# Patient Record
Sex: Male | Born: 1990
Health system: Southern US, Community
[De-identification: ages and names within clinical notes are randomized; demographics above are authoritative.]

## PROBLEM LIST (undated history)

## (undated) DIAGNOSIS — F17200 Nicotine dependence, unspecified, uncomplicated: Secondary | ICD-10-CM

## (undated) DIAGNOSIS — K219 Gastro-esophageal reflux disease without esophagitis: Secondary | ICD-10-CM

## (undated) DIAGNOSIS — F1910 Other psychoactive substance abuse, uncomplicated: Secondary | ICD-10-CM

## (undated) DIAGNOSIS — M797 Fibromyalgia: Secondary | ICD-10-CM

## (undated) DIAGNOSIS — K824 Cholesterolosis of gallbladder: Secondary | ICD-10-CM

## (undated) DIAGNOSIS — F32A Depression, unspecified: Secondary | ICD-10-CM

## (undated) DIAGNOSIS — J45990 Exercise induced bronchospasm: Secondary | ICD-10-CM

## (undated) DIAGNOSIS — K589 Irritable bowel syndrome without diarrhea: Secondary | ICD-10-CM

## (undated) DIAGNOSIS — F329 Major depressive disorder, single episode, unspecified: Secondary | ICD-10-CM

## (undated) DIAGNOSIS — F411 Generalized anxiety disorder: Secondary | ICD-10-CM

## (undated) DIAGNOSIS — Z8489 Family history of other specified conditions: Secondary | ICD-10-CM

## (undated) DIAGNOSIS — M549 Dorsalgia, unspecified: Secondary | ICD-10-CM

## (undated) DIAGNOSIS — G8929 Other chronic pain: Secondary | ICD-10-CM

## (undated) DIAGNOSIS — Z765 Malingerer [conscious simulation]: Secondary | ICD-10-CM

## (undated) DIAGNOSIS — Z973 Presence of spectacles and contact lenses: Secondary | ICD-10-CM

## (undated) DIAGNOSIS — F419 Anxiety disorder, unspecified: Secondary | ICD-10-CM

## (undated) DIAGNOSIS — F319 Bipolar disorder, unspecified: Secondary | ICD-10-CM

## (undated) DIAGNOSIS — K602 Anal fissure, unspecified: Secondary | ICD-10-CM

## (undated) DIAGNOSIS — M2550 Pain in unspecified joint: Secondary | ICD-10-CM

## (undated) HISTORY — DX: Nicotine dependence, unspecified, uncomplicated: F17.200

## (undated) HISTORY — DX: Other chronic pain: M54.9

## (undated) HISTORY — DX: Generalized anxiety disorder: F41.1

## (undated) HISTORY — DX: Irritable bowel syndrome, unspecified: K58.9

## (undated) HISTORY — DX: Exercise induced bronchospasm: J45.990

## (undated) HISTORY — DX: Major depressive disorder, single episode, unspecified: F32.9

## (undated) HISTORY — DX: Gastro-esophageal reflux disease without esophagitis: K21.9

## (undated) HISTORY — DX: Cholesterolosis of gallbladder: K82.4

## (undated) HISTORY — DX: Pain in unspecified joint: M25.50

## (undated) HISTORY — DX: Fibromyalgia: M79.7

## (undated) HISTORY — DX: Presence of spectacles and contact lenses: Z97.3

## (undated) HISTORY — DX: Anal fissure, unspecified: K60.2

## (undated) HISTORY — DX: Depression, unspecified: F32.A

## (undated) HISTORY — DX: Bipolar disorder, unspecified: F31.9

## (undated) HISTORY — PX: WISDOM TOOTH EXTRACTION: SHX21

## (undated) HISTORY — DX: Other psychoactive substance abuse, uncomplicated: F19.10

## (undated) HISTORY — DX: Other chronic pain: G89.29

## (undated) HISTORY — DX: Anxiety disorder, unspecified: F41.9

---
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
## 2011-02-15 LAB — POCT URINALYSIS DIP (DEVICE)
Glucose, UA: NEGATIVE mg/dL
Hgb urine dipstick: NEGATIVE
Nitrite: NEGATIVE
Protein, ur: NEGATIVE mg/dL
pH: 8.5 — ABNORMAL HIGH (ref 5.0–8.0)

## 2011-02-24 ENCOUNTER — Inpatient Hospital Stay (INDEPENDENT_AMBULATORY_CARE_PROVIDER_SITE_OTHER)
Admission: RE | Admit: 2011-02-24 | Discharge: 2011-02-24 | Disposition: A | Discharge status: Home / Self Care | Payer: Medicaid Other | Source: Ambulatory Visit | Attending: Family Medicine | Admitting: Family Medicine

## 2011-02-24 DIAGNOSIS — J40 Bronchitis, not specified as acute or chronic: Secondary | ICD-10-CM

## 2011-03-05 ENCOUNTER — Ambulatory Visit: Payer: Medicaid Other | Admitting: *Deleted
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
## 2011-03-24 ENCOUNTER — Encounter: Payer: Self-pay | Admitting: Medical

## 2011-03-24 ENCOUNTER — Ambulatory Visit (INDEPENDENT_AMBULATORY_CARE_PROVIDER_SITE_OTHER): Payer: Medicaid Other | Admitting: Medical

## 2011-03-24 VITALS — BP 112/82 | HR 100 | Temp 98.0°F | Ht 75.0 in | Wt 230.0 lb

## 2011-03-24 DIAGNOSIS — H6690 Otitis media, unspecified, unspecified ear: Secondary | ICD-10-CM

## 2011-03-24 DIAGNOSIS — Z609 Problem related to social environment, unspecified: Secondary | ICD-10-CM

## 2011-03-24 DIAGNOSIS — R05 Cough: Secondary | ICD-10-CM

## 2011-03-24 DIAGNOSIS — Z7289 Other problems related to lifestyle: Secondary | ICD-10-CM

## 2011-03-24 DIAGNOSIS — Z202 Contact with and (suspected) exposure to infections with a predominantly sexual mode of transmission: Secondary | ICD-10-CM

## 2011-03-24 MED ORDER — AMOXICILLIN 875 MG PO TABS: 875.0000 mg | ORAL_TABLET | Freq: Two times a day (BID) | ORAL | Status: DC

## 2011-03-24 NOTE — Progress Notes (Signed)
Subjective:   HPI Here with mother today for multiple complaints. He has been treated a month ago for bronchitis through urgent care. Was given doxycycline with no improvement. He currently complains of right ear pressure and fullness, cough with productive sputum, some wheezes. Using nothing currently for the symptoms.  He was recently incarcerated for a short period, and while there had a homemade tattoo on his right leg and left upper arm. He is concerned about potential infection from this. He would like testing including STD screening. Just before he left jail he had a negative HIV test. He does note being sexually active, but does not use condoms. He notes 2 sexual partners in the last several months. No other complaints.  Past Medical History  Diagnosis Date  . Anxiety   . Depression   . GERD (gastroesophageal reflux disease)   . Tobacco use disorder   . Gallbladder polyp    History  Substance Use Topics  . Smoking status: Current Everyday Smoker  . Smokeless tobacco: Never Used  . Alcohol Use: No   Reviewed his prior medical, social, surgical history, allergies.  Review of Systems Constitutional: denies fever, chills, sweats, unexpected weight change, anorexia, fatigue Allergy: negative; denies recent sneezing, itching, congestion Dermatology: denies changing moles, rash, lumps, new worrisome lesions ENT: Positive for reduced hearing on the right and congestion. no runny nose, sinus pain, teeth pain Cardiology: denies chest pain, palpitations, edema Respiratory: denies dyspnea on exertion Gastroenterology: denies abdominal pain, nausea, vomiting, diarrhea, constipation, blood in stool, changes in bowel movement, dysphagia Hematology: denies bleeding or bruising problems Musculoskeletal: denies arthralgias, myalgias, joint swelling Urology: Positive for some recent redness rash and dark scabs on the right side of his penis a month ago. Denies prior history of STD. denies  dysuria, difficulty urinating, penile discharge     Objective:   Physical Exam  General appearance: alert, no distress, WD/WN, white male  Skin: Tattoo on right anterior leg just above the knee, tattoo on left upper shoulder, but no erythema or signs of infection HEENT: Right TM with retraction and mild erythema. Otherwise normocephalic, conjunctiva/corneas normal, sclerae anicteric, PERRLA, EOMi, nares patent, no discharge or erythema, pharynx normal Oral cavity: MMM, tongue normal, teeth normal Neck: supple, no lymphadenopathy, no thyromegaly, no masses Heart: RRR, normal S1, S2, no murmurs Lungs: CTA bilaterally, no wheezes, rhonchi, or rales Abdomen: +bs, soft, non tender, non distended, no masses, no hepatomegaly, no splenomegaly Back: No CVA tenderness  Musculoskeletal: Nontender in general  Pulses: 2+ symmetric Psychiatric: normal affect, behavior normal, pleasant     Assessment & Plan:    Encounter Diagnoses  Name Primary?  . High risk social situation   . Venereal disease contact   . Otitis media Yes  . Cough    Patient Instructions  1) Take Amoxicillin 875mg , 1 tablet twice daily for ear infection  2) You can use OTC Mucinex DM or Robitussin DM for cough and congestion.  Drink plenty of fluids, rest.  3) ALWAYS use condoms for safe sex and disease prevention.  Don't use dirty needles or utensil to inject anything into your body or for tattoos.   4) we will call with lab results.

## 2011-03-24 NOTE — Patient Instructions (Signed)
1) Take Amoxicillin 875mg , 1 tablet twice daily for ear infection  2) You can use OTC Mucinex DM or Robitussin DM for cough and congestion.  Drink plenty of fluids, rest.  3) ALWAYS use condoms for safe sex and disease prevention.  Don't use dirty needles or utensil to inject anything into your body or for tattoos.   4) we will call with lab results.

## 2011-03-25 LAB — HEPATITIS B SURFACE ANTIGEN: Hepatitis B Surface Ag: NEGATIVE

## 2011-03-25 LAB — RPR

## 2011-03-25 LAB — GC/CHLAMYDIA PROBE AMP, GENITAL: Chlamydia, DNA Probe: NEGATIVE

## 2011-03-25 LAB — HIV ANTIBODY (ROUTINE TESTING W REFLEX): HIV: NONREACTIVE

## 2011-03-25 LAB — HEPATITIS A ANTIBODY, IGM: Hep A IgM: NEGATIVE

## 2011-03-26 ENCOUNTER — Telehealth: Payer: Self-pay | Admitting: Medical

## 2011-03-26 MED ORDER — AMOXICILLIN 400 MG/5ML PO SUSR: ORAL | Status: DC

## 2011-03-26 NOTE — Telephone Encounter (Signed)
I spoke to mother regarding his labs results.  All labs negative for hep A, hep B, hep C, HIV, syphilis, chlamydia, gonorrhea.  Herpes was equivocal but suggesting negative.   Advised repeat HIV within 6-12 mo.   Also he is not tolerating 875 mg Amoxicillin tablets.  Liquid amoxicillin sent instead.

## 2011-04-22 ENCOUNTER — Other Ambulatory Visit: Payer: Self-pay | Admitting: Orthopedic Surgery

## 2011-04-22 DIAGNOSIS — M545 Low back pain: Secondary | ICD-10-CM

## 2011-04-25 ENCOUNTER — Emergency Department (HOSPITAL_COMMUNITY): Payer: Medicaid Other

## 2011-04-25 ENCOUNTER — Emergency Department (HOSPITAL_COMMUNITY)
Admission: EM | Admit: 2011-04-25 | Discharge: 2011-04-25 | Disposition: A | Discharge status: Home / Self Care | Payer: Medicaid Other | Attending: Emergency Medicine | Admitting: Emergency Medicine

## 2011-04-25 DIAGNOSIS — H5710 Ocular pain, unspecified eye: Secondary | ICD-10-CM | POA: Insufficient documentation

## 2011-04-25 DIAGNOSIS — R51 Headache: Secondary | ICD-10-CM | POA: Insufficient documentation

## 2011-04-25 DIAGNOSIS — S1093XA Contusion of unspecified part of neck, initial encounter: Secondary | ICD-10-CM | POA: Insufficient documentation

## 2011-04-25 DIAGNOSIS — H547 Unspecified visual loss: Secondary | ICD-10-CM | POA: Insufficient documentation

## 2011-04-25 DIAGNOSIS — Z23 Encounter for immunization: Secondary | ICD-10-CM | POA: Insufficient documentation

## 2011-04-25 DIAGNOSIS — S0990XA Unspecified injury of head, initial encounter: Secondary | ICD-10-CM | POA: Insufficient documentation

## 2011-04-25 DIAGNOSIS — S0003XA Contusion of scalp, initial encounter: Secondary | ICD-10-CM | POA: Insufficient documentation

## 2011-04-27 ENCOUNTER — Encounter: Payer: Self-pay | Admitting: Medical

## 2011-04-27 ENCOUNTER — Ambulatory Visit (INDEPENDENT_AMBULATORY_CARE_PROVIDER_SITE_OTHER): Payer: Medicaid Other | Admitting: Medical

## 2011-04-27 VITALS — BP 110/80 | HR 79 | Wt 236.0 lb

## 2011-04-27 DIAGNOSIS — R52 Pain, unspecified: Secondary | ICD-10-CM

## 2011-04-27 DIAGNOSIS — S0093XA Contusion of unspecified part of head, initial encounter: Secondary | ICD-10-CM

## 2011-04-27 DIAGNOSIS — S93409A Sprain of unspecified ligament of unspecified ankle, initial encounter: Secondary | ICD-10-CM

## 2011-04-27 DIAGNOSIS — S0003XA Contusion of scalp, initial encounter: Secondary | ICD-10-CM

## 2011-04-27 DIAGNOSIS — S025XXA Fracture of tooth (traumatic), initial encounter for closed fracture: Secondary | ICD-10-CM

## 2011-04-27 DIAGNOSIS — IMO0002 Reserved for concepts with insufficient information to code with codable children: Secondary | ICD-10-CM

## 2011-04-27 DIAGNOSIS — T07XXXA Unspecified multiple injuries, initial encounter: Secondary | ICD-10-CM

## 2011-04-27 DIAGNOSIS — S1093XA Contusion of unspecified part of neck, initial encounter: Secondary | ICD-10-CM

## 2011-04-27 MED ORDER — OXYCODONE-ACETAMINOPHEN 5-325 MG PO TABS: 1.0000 | ORAL_TABLET | Freq: Four times a day (QID) | ORAL | Status: DC | PRN
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
## 2011-04-27 NOTE — Patient Instructions (Signed)
Rest, use ice pack for your head, neck, and ankle throughout the day for the next 3-4 days.    Percocet as needed every 6 hours for pain for as short a time as needed.  Continue Aleve twice daily.  By Thursday, do gentle neck stretching to loosen the neck.  Elevated and ice your left ankle as well.

## 2011-04-28 ENCOUNTER — Ambulatory Visit
Admission: RE | Admit: 2011-04-28 | Discharge: 2011-04-28 | Disposition: A | Discharge status: Home / Self Care | Payer: Medicaid Other | Source: Ambulatory Visit | Attending: Orthopedic Surgery | Admitting: Orthopedic Surgery

## 2011-04-28 DIAGNOSIS — M545 Low back pain: Secondary | ICD-10-CM

## 2011-05-04 ENCOUNTER — Other Ambulatory Visit: Payer: Self-pay | Admitting: Family Medicine

## 2011-05-26 ENCOUNTER — Other Ambulatory Visit (INDEPENDENT_AMBULATORY_CARE_PROVIDER_SITE_OTHER): Payer: Self-pay

## 2011-05-26 DIAGNOSIS — K824 Cholesterolosis of gallbladder: Secondary | ICD-10-CM

## 2011-05-27 ENCOUNTER — Other Ambulatory Visit: Payer: Medicaid Other

## 2011-06-01 ENCOUNTER — Ambulatory Visit
Admission: RE | Admit: 2011-06-01 | Discharge: 2011-06-01 | Disposition: A | Discharge status: Home / Self Care | Payer: Medicaid Other | Source: Ambulatory Visit | Attending: General Surgery | Admitting: General Surgery

## 2011-06-01 ENCOUNTER — Other Ambulatory Visit: Payer: Medicaid Other

## 2011-06-01 DIAGNOSIS — K824 Cholesterolosis of gallbladder: Secondary | ICD-10-CM

## 2011-06-05 ENCOUNTER — Emergency Department: Payer: Self-pay | Admitting: Unknown Physician Specialty

## 2011-06-21 ENCOUNTER — Encounter (INDEPENDENT_AMBULATORY_CARE_PROVIDER_SITE_OTHER): Payer: Self-pay | Admitting: General Surgery

## 2011-06-30 ENCOUNTER — Encounter (INDEPENDENT_AMBULATORY_CARE_PROVIDER_SITE_OTHER): Payer: Self-pay | Admitting: General Surgery

## 2011-07-16 ENCOUNTER — Ambulatory Visit (HOSPITAL_BASED_OUTPATIENT_CLINIC_OR_DEPARTMENT_OTHER): Admission: RE | Admit: 2011-07-16 | Payer: Medicaid Other | Source: Ambulatory Visit | Admitting: Oral Surgery

## 2011-07-21 ENCOUNTER — Other Ambulatory Visit: Payer: Self-pay | Admitting: Family Medicine

## 2011-07-21 NOTE — Telephone Encounter (Signed)
Not sure about this.

## 2011-08-03 ENCOUNTER — Ambulatory Visit (HOSPITAL_COMMUNITY)
Admission: RE | Admit: 2011-08-03 | Discharge: 2011-08-03 | Disposition: A | Discharge status: Home / Self Care | Payer: Medicaid Other | Source: Ambulatory Visit | Attending: Oral Surgery | Admitting: Oral Surgery

## 2011-08-03 DIAGNOSIS — F17200 Nicotine dependence, unspecified, uncomplicated: Secondary | ICD-10-CM | POA: Insufficient documentation

## 2011-08-03 DIAGNOSIS — F411 Generalized anxiety disorder: Secondary | ICD-10-CM | POA: Insufficient documentation

## 2011-08-03 DIAGNOSIS — J45909 Unspecified asthma, uncomplicated: Secondary | ICD-10-CM | POA: Insufficient documentation

## 2011-08-03 DIAGNOSIS — F319 Bipolar disorder, unspecified: Secondary | ICD-10-CM | POA: Insufficient documentation

## 2011-08-03 DIAGNOSIS — K006 Disturbances in tooth eruption: Secondary | ICD-10-CM | POA: Insufficient documentation

## 2011-08-03 LAB — CBC
MCHC: 35.2 g/dL (ref 30.0–36.0)
Platelets: 224 10*3/uL (ref 150–400)
RDW: 13 % (ref 11.5–15.5)
WBC: 8.5 10*3/uL (ref 4.0–10.5)

## 2011-08-05 NOTE — Op Note (Signed)
NAME:  OLAF, MESA NO.:  [id number]  MEDICAL RECORD NO.:  [id number]  LOCATION:  SDSC                         FACILITY:  [facility]  PHYSICIAN:  Georgia Lopes, M.D.  DATE OF BIRTH:  1991-03-25  DATE OF PROCEDURE:  08/03/2011 DATE OF DISCHARGE:                              OPERATIVE REPORT   PREOPERATIVE DIAGNOSIS:  Impacted teeth numbers 1, 16, 17, and 32.  POSTOPERATIVE DIAGNOSIS:  Impacted teeth numbers 1, 16, 17, and 32.  PROCEDURE:  Removal of teeth numbers 1, 16, 17, and 32.  SURGEON:  Georgia Lopes, MD  ANESTHESIA:  General, Dr. Jacklynn Bue attending, nasal intubation.  ASSISTANTS: 1. Luberta Mutter, DOMA 2. Marlana Latus, DOMA  INDICATIONS FOR PROCEDURE:  Zykee is a 20 year old male who is referred to my office by his general dentist for removal of impacted wisdom teeth x4.  The patient complained of pain for the past several months. Physical exam revealed complete bony impacted teeth numbers 1, 16, 17, and 32.  Kamsiyochukwu has a past medical history significant for asthma, smoking, use of marijuana, drug abuse, mental depression, anxiety, and bipolar.  His current medications include Xanax, Percocet, Naprosyn, hydrocodone, Robaxin, and Prozac.  Because of his high medication intake and difficulty of the planned extractions, general anesthesia was indicated for patient's comfort and safety.  PROCEDURE IN DETAIL:  The patient was taken to the operating room and placed on table in supine position.  General anesthesia was administered intravenously and an oral endotracheal tube was placed and marked.  The patient was draped for the procedure.  The posterior pharynx was suctioned and a throat pack was placed.  Lidocaine 2% with 1:100,000 epinephrine was infiltrated in an inferior alveolar block on the right and left side and buccal and palatal infiltration in the maxilla around the wisdom teeth.  A total of 10 mL was utilized.  A bite block was placed on the  right side of the mouth and sweetheart was used to retract the tongue.  The #15 blade was used to make a incision overlying the tooth #17 carrying buccally on tooth #18 and a similar incision was made with the #15 overlying tooth #16 and carrying anteriorly in an envelope flap technique to the buccal aspect of tooth #15.  The periosteum was reflected with the periosteal elevator.  Then the Sumex drill with fissure bur was used to remove bone around both teeth #16 and #17. Tooth #17 was sectioned and the roots were removed individually with a 301 elevator.  Tooth #16 was removed first attempting with a 301 elevator but finally was removed with the Neurosurgeon.  Sockets were then curetted, irrigated, and closed with 3-0 chromic.  The bite block and sweetheart retractor were repositioned and an envelope flap was created with a #15 blade in the area of tooth #1 and tooth #32.  The periosteum was reflected with the periosteal elevator.  Bone was removed with the Sumex in the maxilla and mandible and the tooth #32 was sectioned and the roots were removed using the 301 elevator in the mandible.  Tooth #1 was removed with a 301 elevator.  Then the sockets were irrigated,  curetted, and closed with 3-0 chromic.  The oral cavity was inspected, found to have good closure and hemostasis.  The oral cavity was irrigated again and suctioned with the Yankauer suction.  The throat pack was removed.  The patient was extubated and taken to the recovery room breathing spontaneously in good condition.  ESTIMATED BLOOD LOSS:  Minimum.  COMPLICATIONS:  None.  SPECIMENS:  None.     Georgia Lopes, M.D.     SMJ/MEDQ  D:  08/03/2011  T:  08/03/2011  Job:  [id number]  Electronically Signed by Ocie Doyne M.D. on 08/05/2011 08:11:35 AM

## 2011-08-17 DIAGNOSIS — Z0271 Encounter for disability determination: Secondary | ICD-10-CM

## 2011-08-18 ENCOUNTER — Ambulatory Visit: Payer: Medicaid Other | Admitting: Medical

## 2011-08-18 ENCOUNTER — Encounter: Payer: Self-pay | Admitting: Medical

## 2011-08-18 ENCOUNTER — Ambulatory Visit (INDEPENDENT_AMBULATORY_CARE_PROVIDER_SITE_OTHER): Payer: 59 | Admitting: Medical

## 2011-08-18 VITALS — BP 130/90 | HR 88 | Temp 97.9°F | Resp 18 | Ht 73.0 in | Wt 229.0 lb

## 2011-08-18 DIAGNOSIS — R05 Cough: Secondary | ICD-10-CM

## 2011-08-18 DIAGNOSIS — Z113 Encounter for screening for infections with a predominantly sexual mode of transmission: Secondary | ICD-10-CM

## 2011-08-18 DIAGNOSIS — Z23 Encounter for immunization: Secondary | ICD-10-CM

## 2011-08-18 DIAGNOSIS — K589 Irritable bowel syndrome without diarrhea: Secondary | ICD-10-CM

## 2011-08-18 DIAGNOSIS — F17200 Nicotine dependence, unspecified, uncomplicated: Secondary | ICD-10-CM

## 2011-08-18 DIAGNOSIS — R059 Cough, unspecified: Secondary | ICD-10-CM

## 2011-08-18 MED ORDER — HYOSCYAMINE SULFATE 0.125 MG PO TABS: ORAL_TABLET | ORAL | Status: DC

## 2011-08-18 NOTE — Progress Notes (Signed)
Subjective:   HPI  Carl Hughes is a 20 y.o. male who presents with his mother for multiple c/o.   He was here back in May for STD screening which was normal.  I advised him to recheck HIV in 66mo.  He is here today to recheck HIV screen.  He is still concerned about genial herpes.  He notes last week having gotten a pustule on his pubic area that is now scabbing.   He denies blister or redness, but just a pustule.  He does shave in the genital region, and this occurred after shaving.  Wonders if this is herpes vs infected hair follicle.  He denies new sexual partners.  He notes ongoing cough x 96mo, worse in the morning.  He continues to smoke, and says he is not ready to quit.  Doesn't feel sick, and no sick contacts.  No other aggravating or relieving factors.    Here for refill on IBS medication.  Has been on this for a while and it works relatively well.  Here for flu shot.   The following portions of the patient's history were reviewed and updated as appropriate: allergies, current medications, past family history, past medical history, past social history, past surgical history and problem list.  Past Medical History  Diagnosis Date  . Anxiety   . Depression   . GERD (gastroesophageal reflux disease)   . Tobacco use disorder   . Gallbladder polyp     Allergies  Allergen Reactions  . Buspar (Buspirone Hcl)   . Ibuprofen (Advil) Hives  . Lamictal (Lamotrigine)     Current Outpatient Prescriptions on File Prior to Visit  Medication Sig Dispense Refill  . bifidobacterium infantis (ALIGN) capsule Take 1 capsule by mouth daily.        Marland Kitchen FLUoxetine (PROZAC) 20 MG tablet Take 10 mg by mouth 2 (two) times daily.        Marland Kitchen gabapentin (NEURONTIN) 400 MG tablet Take 600 mg by mouth 2 (two) times daily.       . hyoscyamine (LEVSIN, ANASPAZ) 0.125 MG tablet Take 0.125 mg by mouth every 12 (twelve) hours.        Marland Kitchen LIDODERM 5 % APPLY 1 PATCH EVERY 12 HOURS AS DIRECTED  30 each  1  .  methocarbamol (ROBAXIN) 750 MG tablet Take 750 mg by mouth 3 (three) times daily.        Marland Kitchen MIRTAZAPINE PO Take by mouth.        . naproxen sodium (ANAPROX) 220 MG tablet Take 220 mg by mouth 2 (two) times daily with a meal.        . QUEtiapine (SEROQUEL) 25 MG tablet Take 25 mg by mouth 2 (two) times daily.        Marland Kitchen ALPRAZolam (XANAX) 1 MG tablet Take 1 mg by mouth 5 (five) times daily.           Review of Systems Constitutional: denies fever, chills, sweats, unexpected weight change, fatigue ENT: no runny nose, ear pain, sore throat Cardiology: denies chest pain, palpitations, edema Respiratory: +cough, occasional wheezing; denies shortness of breath, wheezing Gastroenterology: denies abdominal pain, nausea, vomiting, diarrhea, constipation  Urology: denies dysuria, difficulty urinating, hematuria, urinary frequency, urgency      Objective:   Physical Exam  General appearance: alert, no distress, WD/WN Skin: pubic region with 2mm scaling erythematous lesion, but no pustule, no vesicle, no erythematous base, no drainage HEENT: normocephalic, sclerae anicteric, TMs pearly, nares patent, no discharge or  erythema, pharynx normal Oral cavity: MMM, no lesions Neck: supple, no lymphadenopathy, no thyromegaly, no masses Heart: RRR, normal S1, S2, no murmurs Lungs: congested sounding, few rhonchi, bronchial, but no wheezes, or rales Abdomen: +bs, soft, non tender, non distended, no masses, no hepatomegaly, no splenomegaly Pulses: 2+ symmetric, upper and lower extremities, normal cap refill   Assessment :    Encounter Diagnoses  Name Primary?  . Cough Yes  . Screen for STD (sexually transmitted disease)   . Tobacco use disorder   . Need for influenza vaccination   . IBS (irritable bowel syndrome)     Plan:     Cough - likely due to inflammation from tobacco use.  Advised Mucinex DM if desired.  If cough not improving in 2-3 wk, consider CXR.  Tobacco use - discussed risks and  advised he stop smoking.    STD screening -  Repeat HIV screen today.  Flu vaccine and VIS today.  IBS - refilled medication, advised adding fiber supplement in diet.

## 2011-08-23 LAB — URINALYSIS, ROUTINE W REFLEX MICROSCOPIC
Hgb urine dipstick: NEGATIVE
Ketones, ur: NEGATIVE
Protein, ur: NEGATIVE
Urobilinogen, UA: 0.2

## 2011-08-23 LAB — BASIC METABOLIC PANEL
BUN: 7
CO2: 29
Chloride: 108
Creatinine, Ser: 0.66
Glucose, Bld: 87
Potassium: 3.5

## 2011-08-23 LAB — CBC
HCT: 37.9
MCHC: 35 — ABNORMAL HIGH
MCV: 86
Platelets: 322
RDW: 13.1
WBC: 9.9

## 2011-08-23 LAB — DRUGS OF ABUSE SCREEN W/O ALC, ROUTINE URINE
Amphetamine Screen, Ur: NEGATIVE
Benzodiazepines.: NEGATIVE
Methadone: NEGATIVE
Phencyclidine (PCP): NEGATIVE
Propoxyphene: NEGATIVE

## 2011-08-23 LAB — DIFFERENTIAL
Basophils Absolute: 0
Basophils Relative: 0
Eosinophils Absolute: 0.2
Eosinophils Relative: 2
Neutrophils Relative %: 54

## 2011-08-23 LAB — GC/CHLAMYDIA PROBE AMP, URINE: GC Probe Amp, Urine: NEGATIVE

## 2011-08-23 LAB — HEPATIC FUNCTION PANEL
Albumin: 4.3
Alkaline Phosphatase: 232
Bilirubin, Direct: 0.1
Total Bilirubin: 0.4

## 2011-08-23 LAB — T4, FREE: Free T4: 0.92

## 2011-08-23 LAB — GAMMA GT: GGT: 25

## 2011-08-30 ENCOUNTER — Inpatient Hospital Stay (INDEPENDENT_AMBULATORY_CARE_PROVIDER_SITE_OTHER)
Admission: RE | Admit: 2011-08-30 | Discharge: 2011-08-30 | Disposition: A | Discharge status: Home / Self Care | Payer: 59 | Source: Ambulatory Visit | Attending: Family Medicine | Admitting: Family Medicine

## 2011-08-30 DIAGNOSIS — S335XXA Sprain of ligaments of lumbar spine, initial encounter: Secondary | ICD-10-CM

## 2011-09-27 ENCOUNTER — Other Ambulatory Visit: Payer: Self-pay | Admitting: Medical

## 2011-09-28 ENCOUNTER — Telehealth: Payer: Self-pay | Admitting: Medical

## 2011-09-28 NOTE — Telephone Encounter (Signed)
Message copied by Janeice Robinson on Tue Sep 28, 2011  2:56 PM ------      Message from: Jac Canavan      Created: Tue Sep 28, 2011  7:56 AM       pls ask Vernona Rieger or call pt to find out how he takes his Hyoscyamine.  Ask what he is taking it for?  Is he using 1 BID,TID, QID?             I received request for 90 day supply.

## 2011-09-28 NOTE — Telephone Encounter (Signed)
LAURA SAID HE TAKES THE MEDICATION FOR GERD AND IBS. SHE SAID HE TAKES BETWEEN 2 AND 4, BUT HE AT LEAST TAKES 2 A DAY. CLD

## 2011-10-01 NOTE — Telephone Encounter (Signed)
I discussed refills with mother.  He takes 2-4 tablets daily, originally started on this by Selena Batten, the midlevel that used to work here.  I advised that I am comfortable with him using this as little as possible.  I wrote for up to 3 daily, 90 day supply. Of note, mother dispenses his medications in general and keeps a close watch on his medication usage.  Rx given to mother.

## 2011-10-12 ENCOUNTER — Ambulatory Visit (INDEPENDENT_AMBULATORY_CARE_PROVIDER_SITE_OTHER): Payer: 59 | Admitting: Medical

## 2011-10-12 ENCOUNTER — Ambulatory Visit: Payer: 59 | Admitting: Medical

## 2011-10-12 ENCOUNTER — Encounter: Payer: Self-pay | Admitting: Medical

## 2011-10-12 VITALS — BP 122/80 | HR 72 | Temp 97.8°F | Resp 16 | Wt 242.0 lb

## 2011-10-12 DIAGNOSIS — K589 Irritable bowel syndrome without diarrhea: Secondary | ICD-10-CM

## 2011-10-12 DIAGNOSIS — R252 Cramp and spasm: Secondary | ICD-10-CM | POA: Insufficient documentation

## 2011-10-12 DIAGNOSIS — R259 Unspecified abnormal involuntary movements: Secondary | ICD-10-CM

## 2011-10-12 DIAGNOSIS — R110 Nausea: Secondary | ICD-10-CM | POA: Insufficient documentation

## 2011-10-12 MED ORDER — PROMETHAZINE HCL 25 MG PO TABS: 25.0000 mg | ORAL_TABLET | Freq: Four times a day (QID) | ORAL | Status: AC | PRN

## 2011-10-12 NOTE — Progress Notes (Signed)
Subjective:   HPI  Carl Hughes is a 20 y.o. male who presents with stomach pain x 3 days.  Awoke Sunday, burped up bad egg smell, and stools smelling foul like bad eggs.  Denies vomiting.  He notes belly pain, but it is better today.  He does note fatigue, no appetite, and he notes 50% diarrhea, and 50% constipation, and on average, 4-5 BMs daily for the last few days.  He does report some nausea.   He also notes some muscle spasm under both arms in the tricep area bilat for last few days.  Denies GU symptoms.  He report low grade fever, denies chills or sweats.  He notes some stuffy nose but no other URI symptoms.  No sick contacts with similar symptoms.  Thinks its just something he ate.  Mom gave him some phenergan to help after Pepto Bismol didn't help.  Phenergan helped. Ate some sausage balls, cheese dip, hotdogs that evening. No other aggravating or relieving factors.    No other c/o.  The following portions of the patient's history were reviewed and updated as appropriate: allergies, current medications, past family history, past medical history, past social history, past surgical history and problem list.  Past Medical History  Diagnosis Date  . Anxiety   . Depression   . GERD (gastroesophageal reflux disease)   . Tobacco use disorder   . Gallbladder polyp    Review of Systems Constitutional: +fever, -chills, -sweats, -unexpected -weight change,+fatigue ENT: -runny nose, -ear pain, -sore throat Cardiology:  -chest pain, +palpitations, -edema Respiratory: -cough, -shortness of breath, +wheezing Gastroenterology: +abdominal pain, +nausea, -vomiting, +diarrhea, +constipation Hematology: -bleeding or bruising problems Musculoskeletal: -arthralgias, -myalgias, -joint swelling, +back pain Ophthalmology: -vision changes Urology: -dysuria, -difficulty urinating, -hematuria, -urinary frequency, -urgency Neurology: -headache, -weakness, +tingling, -numbness    Objective:   Physical  Exam  Filed Vitals:   10/12/11 1528  BP: 122/80  Pulse: 72  Temp: 97.8 F (36.6 C)  Resp: 16    General appearance: alert, no distress, WD/WN, lying on exam table  HEENT: normocephalic, sclerae anicteric, TMs pearly, nares patent, no discharge or erythema, pharynx normal Oral cavity: MMM, no lesions Neck: supple, no lymphadenopathy, no thyromegaly, no masses Heart: RRR, normal S1, S2, no murmurs Lungs: CTA bilaterally, no wheezes, rhonchi, or rales Abdomen: +bs, soft, mild lower abdominal tenderness, non distended, no masses, no hepatomegaly, no splenomegaly Back: nontender Pulses: 2+ symmetric, upper and lower extremities, normal cap refill MSK: UE nontender, normal ROM, no deformity, no swelling   Assessment and Plan :    Encounter Diagnoses  Name Primary?  . IBS (irritable bowel syndrome) Yes  . Spasm   . Nausea    IBS - advised that his symptoms seem diet related.  Discussed foods to limit or avoid due to aggravation of GERD or IBS.  Gave 1800 calories diet plan to follow, eat more raw vegetables and fruits, eat less greasy/processed foods, drink more water.    Spasm - trial of OTC Tonic water.   If not improving, he will call psychiatrist as some of these symptoms could represent extrapyramidal symptoms related to his psychotropic medications.     Nausea - promethazine short term as needed for nausea and vomiting.  Follow-up if worse or not improving.

## 2011-10-12 NOTE — Patient Instructions (Signed)
BRAT diet for a few days - bananas, rice, applesauce, toast for a few days.  Consider some Tonic water for the spasm.  If not improving, call your psychiatrist to see if they think it could be coming from your medications.   Irritable Bowel Syndrome Irritable Bowel Syndrome (IBS) is caused by a disturbance of normal bowel function. Other terms used are spastic colon, mucous colitis, and irritable colon. It does not require surgery, nor does it lead to cancer. There is no cure for IBS. But with proper diet, stress reduction, and medication, you will find that your problems (symptoms) will gradually disappear or improve. IBS is a common digestive disorder. It usually appears in late adolescence or early adulthood. Women develop it twice as often as men. CAUSES  After food has been digested and absorbed in the small intestine, waste material is moved into the colon (large intestine). In the colon, water and salts are absorbed from the undigested products coming from the small intestine. The remaining residue, or fecal material, is held for elimination. Under normal circumstances, gentle, rhythmic contractions on the bowel walls push the fecal material along the colon towards the rectum. In IBS, however, these contractions are irregular and poorly coordinated. The fecal material is either retained too long, resulting in constipation, or expelled too soon, producing diarrhea. SYMPTOMS  The most common symptom of IBS is pain. It is typically in the lower left side of the belly (abdomen). But it may occur anywhere in the abdomen. It can be felt as heartburn, backache, or even as a dull pain in the arms or shoulders. The pain comes from excessive bowel-muscle spasms and from the buildup of gas and fecal material in the colon. This pain:  Can range from sharp belly (abdominal) cramps to a dull, continuous ache.   Usually worsens soon after eating.   Is typically relieved by having a bowel movement or passing  gas.  Abdominal pain is usually accompanied by constipation. But it may also produce diarrhea. The diarrhea typically occurs right after a meal or upon arising in the morning. The stools are typically soft and watery. They are often flecked with secretions (mucus). Other symptoms of IBS include:  Bloating.   Loss of appetite.   Heartburn.   Feeling sick to your stomach (nausea).   Belching   Vomiting   Gas.  IBS may also cause a number of symptoms that are unrelated to the digestive system:  Fatigue.   Headaches.   Anxiety   Shortness of breath   Difficulty in concentrating.   Dizziness.  These symptoms tend to come and go. DIAGNOSIS  The symptoms of IBS closely mimic the symptoms of other, more serious digestive disorders. So your caregiver may wish to perform a variety of additional tests to exclude these disorders. He/she wants to be certain of learning what is wrong (diagnosis). The nature and purpose of each test will be explained to you. TREATMENT A number of medications are available to help correct bowel function and/or relieve bowel spasms and abdominal pain. Among the drugs available are:  Mild, non-irritating laxatives for severe constipation and to help restore normal bowel habits.   Specific anti-diarrheal medications to treat severe or prolonged diarrhea.   Anti-spasmodic agents to relieve intestinal cramps.   Your caregiver may also decide to treat you with a mild tranquilizer or sedative during unusually stressful periods in your life.  The important thing to remember is that if any drug is prescribed for you, make  sure that you take it exactly as directed. Make sure that your caregiver knows how well it worked for you. HOME CARE INSTRUCTIONS   Avoid foods that are high in fat or oils. Some examples ZOX:WRUEA cream, butter, frankfurters, sausage, and other fatty meats.   Avoid foods that have a laxative effect, such as fruit, fruit juice, and dairy  products.   Cut out carbonated drinks, chewing gum, and "gassy" foods, such as beans and cabbage. This may help relieve bloating and belching.   Bran taken with plenty of liquids may help relieve constipation.   Keep track of what foods seem to trigger your symptoms.   Avoid emotionally charged situations or circumstances that produce anxiety.   Start or continue exercising.   Get plenty of rest and sleep.  MAKE SURE YOU:   Understand these instructions.   Will watch your condition.   Will get help right away if you are not doing well or get worse.  Document Released: 10/25/2005 Document Revised: 07/07/2011 Document Reviewed: 06/14/2008 [facility] Patient Information 2012 [place], [place].
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
## 2011-12-08 ENCOUNTER — Encounter (HOSPITAL_COMMUNITY): Payer: Self-pay | Admitting: *Deleted

## 2011-12-08 ENCOUNTER — Emergency Department (HOSPITAL_COMMUNITY)
Admission: EM | Admit: 2011-12-08 | Discharge: 2011-12-08 | Disposition: A | Discharge status: Home / Self Care | Payer: 59 | Source: Home / Self Care | Attending: Family Medicine | Admitting: Family Medicine

## 2011-12-08 DIAGNOSIS — IMO0002 Reserved for concepts with insufficient information to code with codable children: Secondary | ICD-10-CM

## 2011-12-08 MED ORDER — TRAMADOL HCL 50 MG PO TABS: 50.0000 mg | ORAL_TABLET | Freq: Four times a day (QID) | ORAL | Status: AC | PRN

## 2011-12-08 MED ORDER — KETOROLAC TROMETHAMINE 30 MG/ML IJ SOLN
30.0000 mg | Freq: Once | INTRAMUSCULAR | Status: AC
  Administered 2011-12-08: 30 mg via INTRAMUSCULAR

## 2011-12-08 MED ORDER — KETOROLAC TROMETHAMINE 30 MG/ML IJ SOLN
INTRAMUSCULAR | Status: AC
  Filled 2011-12-08: qty 1

## 2011-12-08 MED ORDER — CYCLOBENZAPRINE HCL 5 MG PO TABS: 5.0000 mg | ORAL_TABLET | Freq: Three times a day (TID) | ORAL | Status: DC | PRN

## 2011-12-08 NOTE — ED Notes (Signed)
Pt. came back in and requested a work note. Dr. Artis Flock said he can return tomorrow. Note done and given to pt.  He said it was supposed to be a school note. Note redone and given to pt. Carl Hughes 12/08/2011

## 2011-12-08 NOTE — ED Notes (Addendum)
PT  REPORTS  HE  WAS   Lifting  Metal  Objects  Three days  Ago  And    Developed  Back pain  Which  Became  Worse   Yesterday  He  Reports        The  Pain is  Worse on movement and  posistion   He  denys  Any urinary  symptoms

## 2011-12-08 NOTE — ED Provider Notes (Signed)
History     CSN: [id number]  Arrival date & time 12/08/11  1537   First MD Initiated Contact with Patient 12/08/11 1653      Chief Complaint  Patient presents with  . Back Pain    (Consider location/radiation/quality/duration/timing/severity/associated sxs/prior treatment) Patient is a 21 y.o. male presenting with back pain. The history is provided by the patient.  Back Pain  This is a chronic problem. The current episode started more than 2 days ago. The problem occurs constantly. The problem has been gradually worsening. The pain is associated with lifting heavy objects. The quality of the pain is described as shooting. The pain does not radiate. The pain is mild. The symptoms are aggravated by certain positions and twisting. The pain is the same all the time. Pertinent negatives include no numbness, no abdominal pain, no bowel incontinence, no perianal numbness, no bladder incontinence, no dysuria, no paresthesias, no paresis, no tingling and no weakness. Risk factors include obesity.    Past Medical History  Diagnosis Date  . Anxiety   . Depression   . GERD (gastroesophageal reflux disease)   . Current smoker   . Gallbladder polyp   . IBS (irritable bowel syndrome)   . Chronic back pain     Past Surgical History  Procedure Date  . Wisdom tooth extraction     History reviewed. No pertinent family history.  History  Substance Use Topics  . Smoking status: Current Everyday Smoker -- 1.0 packs/day    Types: Cigarettes  . Smokeless tobacco: Never Used  . Alcohol Use: No      Review of Systems  Constitutional: Negative.   Gastrointestinal: Negative.  Negative for abdominal pain and bowel incontinence.  Genitourinary: Negative for bladder incontinence and dysuria.  Musculoskeletal: Positive for back pain. Negative for myalgias, joint swelling and gait problem.  Neurological: Negative.  Negative for tingling, weakness, numbness and paresthesias.    Allergies    Buspar; Ibuprofen; and Lamictal  Home Medications   Current Outpatient Rx  Name Route Sig Dispense Refill  . ALPRAZOLAM 1 MG PO TABS Oral Take 1 mg by mouth 5 (five) times daily.      Marland Kitchen ALIGN PO CAPS Oral Take 1 capsule by mouth daily.      . CYCLOBENZAPRINE HCL 5 MG PO TABS Oral Take 1 tablet (5 mg total) by mouth 3 (three) times daily as needed for muscle spasms. 30 tablet 0  . FLUOXETINE HCL 20 MG PO TABS Oral Take 10 mg by mouth 2 (two) times daily.      Marland Kitchen GABAPENTIN 400 MG PO TABS Oral Take 600 mg by mouth 2 (two) times daily.     Marland Kitchen HYOSCYAMINE SULFATE 0.125 MG PO TABS  1-2 tablets BID 60 tablet 1  . LIDODERM 5 % EX PTCH  APPLY 1 PATCH EVERY 12 HOURS AS DIRECTED 30 each 1  . METHOCARBAMOL 750 MG PO TABS Oral Take 750 mg by mouth 3 (three) times daily.      Marland Kitchen MIRTAZAPINE PO Oral Take by mouth.      Marland Kitchen NAPROXEN SODIUM 220 MG PO TABS Oral Take 500 mg by mouth 2 (two) times daily with a meal.     . QUETIAPINE FUMARATE 25 MG PO TABS Oral Take 25 mg by mouth at bedtime.     Marland Kitchen SAPROPTERIN DIHYDROCHLORIDE 100 MG PO TBSO Oral Take 10 mg/kg by mouth daily.      . TRAMADOL HCL 50 MG PO TABS Oral Take 1 tablet (  50 mg total) by mouth every 6 (six) hours as needed for pain. 20 tablet 0    BP 125/84  Pulse 112  Temp(Src) 97 F (36.1 C) (Oral)  Resp 18  SpO2 96%  Physical Exam  Nursing note and vitals reviewed. Constitutional: He is oriented to person, place, and time. He appears well-developed and well-nourished.  Neck: Normal range of motion. Neck supple.  Abdominal: Soft. Bowel sounds are normal.  Musculoskeletal: He exhibits tenderness.       Back:  Neurological: He is alert and oriented to person, place, and time. He has normal reflexes.  Skin: Skin is warm and dry.  Psychiatric: He has a normal mood and affect.    ED Course  Procedures (including critical care time)  Labs Reviewed - No data to display No results found.   1. Chronic sprain or strain of lumbar region        MDM          Barkley Bruns, MD 12/08/11 1725

## 2011-12-13 ENCOUNTER — Emergency Department (INDEPENDENT_AMBULATORY_CARE_PROVIDER_SITE_OTHER)
Admission: EM | Admit: 2011-12-13 | Discharge: 2011-12-13 | Disposition: A | Discharge status: Home / Self Care | Payer: 59 | Source: Home / Self Care

## 2011-12-13 ENCOUNTER — Encounter (HOSPITAL_COMMUNITY): Payer: Self-pay

## 2011-12-13 ENCOUNTER — Telehealth (HOSPITAL_COMMUNITY): Payer: Self-pay | Admitting: *Deleted

## 2011-12-13 DIAGNOSIS — M542 Cervicalgia: Secondary | ICD-10-CM

## 2011-12-13 DIAGNOSIS — G8929 Other chronic pain: Secondary | ICD-10-CM

## 2011-12-13 MED ORDER — HYDROCODONE-ACETAMINOPHEN 5-325 MG PO TABS: 1.0000 | ORAL_TABLET | Freq: Four times a day (QID) | ORAL | Status: AC | PRN

## 2011-12-13 NOTE — ED Notes (Signed)
Patient c/o his pain has gotten worse; has been taking the 5 mg flexaril 3-4 at a time to help w his pain w/o relief; "I can get the 15 mg tablets OTC without a rx" ; c/o pain in neck that the was not having on his last visit

## 2011-12-13 NOTE — ED Provider Notes (Signed)
History     CSN: [id number]  Arrival date & time 12/13/11  0916   None     Chief Complaint  Patient presents with  . Muscle Pain    (Consider location/radiation/quality/duration/timing/severity/associated sxs/prior treatment) HPI Comments: Corie presents today with complaints of neck pain. He states he was seen one week ago with low back pain which has now moved up into his neck. He states that he has chronic low back pain and was prescribed "crappy medication" last week when he was here. He has been taking 3-4 tablets of the Flexeril 5 mg which was prescribed for him, and the tramadol that was prescribed "is crap and doesn't work". He states that he has bottles of Robaxin and Skelaxin at home and these also do not work. He has had oxycodone and hydrocodone in the past and this is the only thing that works for his pain. He did not schedule an appointment with his primary care doctor because they will not prescribe pain medications for him. His neck pain began last night. He denies injury. No radiation of pain or parasthesias.    Past Medical History  Diagnosis Date  . Anxiety   . Depression   . GERD (gastroesophageal reflux disease)   . Tobacco use disorder   . Gallbladder polyp   . IBS (irritable bowel syndrome)   . Chronic back pain     Past Surgical History  Procedure Date  . Wisdom tooth extraction     History reviewed. No pertinent family history.  History  Substance Use Topics  . Smoking status: Current Everyday Smoker -- 1.0 packs/day    Types: Cigarettes  . Smokeless tobacco: Never Used  . Alcohol Use: No      Review of Systems  HENT: Positive for neck pain.   Musculoskeletal: Positive for back pain.  Neurological: Negative for weakness and numbness.    Allergies  Buspar; Ibuprofen; and Lamictal  Home Medications   Current Outpatient Rx  Name Route Sig Dispense Refill  . ALPRAZOLAM 1 MG PO TABS Oral Take 1 mg by mouth 5 (five) times daily.      Marland Kitchen  ALIGN PO CAPS Oral Take 1 capsule by mouth daily.      Marland Kitchen FLUOXETINE HCL 20 MG PO TABS Oral Take 10 mg by mouth 2 (two) times daily.      Marland Kitchen GABAPENTIN 400 MG PO TABS Oral Take 600 mg by mouth 2 (two) times daily.     Marland Kitchen HYOSCYAMINE SULFATE 0.125 MG PO TABS  1-2 tablets BID 60 tablet 1  . LIDODERM 5 % EX PTCH  APPLY 1 PATCH EVERY 12 HOURS AS DIRECTED 30 each 1  . MIRTAZAPINE PO Oral Take by mouth.      Marland Kitchen NAPROXEN SODIUM 220 MG PO TABS Oral Take 500 mg by mouth 2 (two) times daily with a meal.     . TRAMADOL HCL 50 MG PO TABS Oral Take 1 tablet (50 mg total) by mouth every 6 (six) hours as needed for pain. 20 tablet 0  . HYDROCODONE-ACETAMINOPHEN 5-325 MG PO TABS Oral Take 1 tablet by mouth every 6 (six) hours as needed for pain. 8 tablet 0    BP 138/92  Pulse 97  Temp(Src) 98.6 F (37 C) (Oral)  Resp 16  SpO2 100%  Physical Exam  Nursing note and vitals reviewed. Constitutional: He appears well-developed and well-nourished.       Obese pt, flat affect.  HENT:  Head: Normocephalic and atraumatic.  Neck: Neck supple.  Cardiovascular: Normal rate, regular rhythm and normal heart sounds.   Pulses:      Radial pulses are 2+ on the right side, and 2+ on the left side.  Pulmonary/Chest: Effort normal and breath sounds normal. No respiratory distress.  Musculoskeletal:       Cervical back: He exhibits decreased range of motion (decreased ROM all directions) and tenderness (bilat paraspinal muscle TTP). He exhibits no swelling, no edema, no deformity and no spasm.  Lymphadenopathy:    He has no cervical adenopathy.  Neurological: He is alert.  Reflex Scores:      Bicep reflexes are 2+ on the right side and 2+ on the left side. Skin: Skin is warm and dry.  Psychiatric: He has a normal mood and affect.    ED Course  Procedures (including critical care time)  Labs Reviewed - No data to display No results found.   1. Cervical pain   2. Chronic back pain       MDM   narcotic  registry reviewed. Discussed with pt that we are not the provider to see for chronic pain mgmt. He needs to f/u with his PCP and discuss other treatment options. Discussed that treatment may not include pain medications, though he was centered on where he could go to get rx pain medication.         Melody Comas, [place] 12/13/11 1134

## 2011-12-13 NOTE — ED Provider Notes (Signed)
Medical screening examination/treatment/procedure(s) were performed by non-physician practitioner and as supervising physician I was immediately available for consultation/collaboration.  Raynald Blend, MD 12/13/11 1440

## 2012-04-19 ENCOUNTER — Ambulatory Visit (INDEPENDENT_AMBULATORY_CARE_PROVIDER_SITE_OTHER): Payer: 59 | Admitting: Medical

## 2012-04-19 ENCOUNTER — Encounter: Payer: Self-pay | Admitting: Medical

## 2012-04-19 VITALS — BP 122/90 | HR 76 | Temp 97.8°F | Resp 16 | Wt 260.0 lb

## 2012-04-19 DIAGNOSIS — R102 Pelvic and perineal pain: Secondary | ICD-10-CM

## 2012-04-19 DIAGNOSIS — G8929 Other chronic pain: Secondary | ICD-10-CM

## 2012-04-19 DIAGNOSIS — K589 Irritable bowel syndrome without diarrhea: Secondary | ICD-10-CM

## 2012-04-19 DIAGNOSIS — N50819 Testicular pain, unspecified: Secondary | ICD-10-CM

## 2012-04-19 DIAGNOSIS — N509 Disorder of male genital organs, unspecified: Secondary | ICD-10-CM

## 2012-04-19 DIAGNOSIS — M549 Dorsalgia, unspecified: Secondary | ICD-10-CM

## 2012-04-19 DIAGNOSIS — R109 Unspecified abdominal pain: Secondary | ICD-10-CM

## 2012-04-19 LAB — POCT URINALYSIS DIPSTICK
Bilirubin, UA: NEGATIVE
Blood, UA: NEGATIVE
Ketones, UA: NEGATIVE
pH, UA: 5

## 2012-04-19 MED ORDER — NAPROXEN 500 MG PO TABS: 500.0000 mg | ORAL_TABLET | Freq: Two times a day (BID) | ORAL | Status: DC

## 2012-04-19 MED ORDER — LANSOPRAZOLE 30 MG PO CPDR: 30.0000 mg | DELAYED_RELEASE_CAPSULE | Freq: Every day | ORAL | Status: DC

## 2012-04-19 NOTE — Progress Notes (Signed)
  Subjective:   HPI  Carl Hughes is a 21 y.o. male who presents with multiple c/o. He is concerned about 1 wk hx/o discomfort in right testicle.  He notes some pain with urination.  He is sexually active with multiple partners, uses condoms some of the time.  Denies penile discharge, no redness, swelling.  He has had UTI as a child, but none of recent.    He has hx/o IBS.  Has used antispasmodics, but used sisters prevacid which seemed to help.  He wants to begin Prevacid.  He does eat spicy foods.    He has hx/o chronic back pain.  Has seen back specialist prior.  Has been on Naprosyn daily, Neurontin daily and needs refills.  He does some stretching.  No other aggravating or relieving factors.    No other c/o.  The following portions of the patient's history were reviewed and updated as appropriate: allergies, current medications, past family history, past medical history, past social history, past surgical history and problem list.  Past Medical History  Diagnosis Date  . Anxiety   . Depression   . GERD (gastroesophageal reflux disease)   . Tobacco use disorder   . Gallbladder polyp   . IBS (irritable bowel syndrome)   . Chronic back pain     Allergies  Allergen Reactions  . Buspar (Buspirone Hcl)   . Ibuprofen (Ibuprofen) Hives  . Lamictal (Lamotrigine)      Review of Systems ROS reviewed and was negative other than noted in HPI or above.    Objective:   Physical Exam  General appearance: alert, no distress, WD/WN Abdomen: +bs, soft, non tender, non distended, no masses, no hepatomegaly, no splenomegaly GU: normal external genitalia, no lesions, no mass, mild right testicle tenderness, otherwise nontender, no hernia Pulses: 2+ symmetric  Assessment and Plan :     Encounter Diagnoses  Name Primary?  . Pelvic pain in male Yes  . Testicular pain   . Chronic back pain   . IBS (irritable bowel syndrome)    Pelvic and testicular pain - Urinalysis unremarkable.  STD  screening.  Chronic back [facility] - [facility]  Discussed need to use exercise and stretching to deal with the pain.  NSAIDs BID and neurontin doesn't sem to be the best long term solution.  request most recent spine center notes.  Refilled NSAID today.  IBS - trial of Prevacid since he saw some improvement on this.   Discussed other treatment for IBS.

## 2012-08-02 ENCOUNTER — Ambulatory Visit (INDEPENDENT_AMBULATORY_CARE_PROVIDER_SITE_OTHER): Payer: 59 | Admitting: Medical

## 2012-08-02 ENCOUNTER — Encounter: Payer: Self-pay | Admitting: Medical

## 2012-08-02 VITALS — BP 120/90 | HR 110 | Temp 97.9°F | Resp 18 | Wt 267.0 lb

## 2012-08-02 DIAGNOSIS — R51 Headache: Secondary | ICD-10-CM

## 2012-08-02 DIAGNOSIS — L259 Unspecified contact dermatitis, unspecified cause: Secondary | ICD-10-CM

## 2012-08-02 DIAGNOSIS — G8929 Other chronic pain: Secondary | ICD-10-CM

## 2012-08-02 DIAGNOSIS — M549 Dorsalgia, unspecified: Secondary | ICD-10-CM

## 2012-08-02 DIAGNOSIS — Z23 Encounter for immunization: Secondary | ICD-10-CM

## 2012-08-02 DIAGNOSIS — L309 Dermatitis, unspecified: Secondary | ICD-10-CM

## 2012-08-02 MED ORDER — ELETRIPTAN HYDROBROMIDE 40 MG PO TABS: 40.0000 mg | ORAL_TABLET | ORAL | Status: DC | PRN

## 2012-08-02 MED ORDER — HYDROCORTISONE 2.5 % EX CREA: TOPICAL_CREAM | Freq: Two times a day (BID) | CUTANEOUS | Status: DC

## 2012-08-02 NOTE — Patient Instructions (Addendum)
Begin Hydrocortisone 2.5% cream to rash 2-3 times daily for the next 5-7 days.   Use ice pack, cool baths to help.  Call if not improving.  Headaches - start weaning down on naprosyn by taking 1 tablet every other night for 1-2 weeks, then try stopping this completely for now. In the meantime, use one of the muscle relaxers nightly for the next 3 nights.  If the headache is not improving, try 1 relpax sample for headache.  This can be repeated in 2 hours, but maximum of 2 daily.   relpax is meant to break a headache.  Also, try and continue to gradually cut back on caffeine from soda, tea, coffee, etc.    Regarding back pain, I recommend we try physical therapy.

## 2012-08-02 NOTE — Progress Notes (Signed)
Subjective:   HPI  Carl Hughes is a 21 y.o. male who presents for multiple c/o.  Rash - started about 3 weeks ago not improving.  Thinks it started when his dog got in to some insulation.   He has been having bumps and rash on hands and penis.   Dog does go outside, but not sure about poison ivy exposure.  Denies other exposure.   Using OTC hydrocortisone with some benefit.  Has tried other OTC creams, oatmeal baths, benadryl without benefit.  No other rash.  No other exposures.  Denies any new sexual contacts.  No recent hiking or personal poison ivy exposure.  Chronic headache - 1 wk of worse headache, but gets frequent headaches.  Headaches typically left back of head , throbbing but headaches can move around.  Denies URI symptoms, allergy symptoms, nausea, numbness, tingling, weakness.  Gets some photophobia.  Not sure about triggers, but headaches usually worse at bedtime.  Drinks some tea and soda, but has cut back on soda.  No coffee.  Used Excedrin some without relief. In the past, would go to Urgent Care for acute headache, would be given Tramadol or Vicodin.  Chronic back pain - typically uses Naproxen BID, sees chiropractor some.  Uses Skelaxin or Robaxin periodically.   Has some of each at home.  Pain is daily.  Want flu vaccine today.  The following portions of the patient's history were reviewed and updated as appropriate: allergies, current medications, past family history, past medical history, past social history, past surgical history and problem list.  Past Medical History  Diagnosis Date  . Anxiety   . Depression   . GERD (gastroesophageal reflux disease)   . Tobacco use disorder   . Gallbladder polyp   . IBS (irritable bowel syndrome)   . Chronic back pain     Allergies  Allergen Reactions  . Buspar (Buspirone Hcl)   . Ibuprofen (Ibuprofen) Hives  . Lamictal (Lamotrigine)     Review of Systems ROS reviewed and was negative other than noted in HPI or above.   Objective:   Physical Exam  General appearance: alert, no distress, WD/WN Skin: scattered rough flesh to pink colored small papules not well defined on several fingers, penis with a few whealed lesions with erythema HEENT: normocephalic, sclerae anicteric, TMs pearly, nares patent, no discharge or erythema, pharynx normal Oral cavity: MMM, no lesions Neck: supple, no lymphadenopathy, no thyromegaly, no masses Heart: RRR, normal S1, S2, no murmurs Lungs: CTA bilaterally, no wheezes, rhonchi, or rales Pulses: 2+ symmetric Neuro: Cn2-12 intact  Assessment and Plan :     Encounter Diagnoses  Name Primary?  . Chronic headache Yes  . Chronic back pain   . Dermatitis   . Need for prophylactic vaccination and inoculation against influenza    Chronic headache -  start weaning down on naprosyn by taking 1 tablet every other night for 1-2 weeks, then try stopping this completely for now. In the meantime, use one of the muscle relaxers nightly for the next 3 nights.  If the headache is not improving, try 1 relpax sample for headache.  This can be repeated in 2 hours, but maximum of 2 daily.   relpax is meant to break a headache.  Also, try and continue to gradually cut back on caffeine from soda, tea, coffee, etc.  Recheck 2-3 wk.  Chronic back pain - advised daily stretching routine, avoid aggravating factors.  Advised using medication prn instead of daily.  advised PT.  They will consider and let me know about referral to therapy.    Dermatitis - script for hydrocortisone 2.5% topically x 7-10 days.  Can use ice pack or cool bath to the rash areas.  Call/return if not improving.  Flu vaccine, counseling and VIS given.

## 2012-08-04 ENCOUNTER — Telehealth: Payer: Self-pay | Admitting: Medical

## 2012-08-04 ENCOUNTER — Other Ambulatory Visit: Payer: Self-pay | Admitting: Medical

## 2012-08-04 MED ORDER — METHYLPREDNISOLONE 4 MG PO KIT: PACK | ORAL | Status: DC

## 2012-08-04 NOTE — Telephone Encounter (Signed)
Medrol dose pak sent.  Don't take Naprosyn or other anti-inflammatories while on the steroid.

## 2012-08-11 ENCOUNTER — Encounter: Payer: Self-pay | Admitting: Medical

## 2012-08-11 ENCOUNTER — Ambulatory Visit (INDEPENDENT_AMBULATORY_CARE_PROVIDER_SITE_OTHER): Payer: 59 | Admitting: Medical

## 2012-08-11 VITALS — BP 124/90 | HR 104 | Resp 18 | Wt 258.0 lb

## 2012-08-11 DIAGNOSIS — R21 Rash and other nonspecific skin eruption: Secondary | ICD-10-CM

## 2012-08-11 MED ORDER — PERMETHRIN 5 % EX CREA: TOPICAL_CREAM | Freq: Once | CUTANEOUS | Status: DC

## 2012-08-11 NOTE — Progress Notes (Signed)
  Subjective:   HPI  Carl Hughes is a 21 y.o. male who presents for rash not improved.   Rash - started about 4+ weeks ago not improving.  Thinks it started when his dog got in to some insulation.   He has been having bumps and rash on hands and penis.   Dog does go outside, but not sure about poison ivy exposure.  Denies other exposure.   Using 2.5% hydrocortisone with some benefit on the penis.  Has tried other OTC creams, oatmeal baths, benadryl without benefit.  No other rash.  No other exposures.  Denies any new sexual contacts.  No recent hiking or personal poison ivy exposure.  The following portions of the patient's history were reviewed and updated as appropriate: allergies, current medications, past family history, past medical history, past social history, past surgical history and problem list.  Review of Systems ROS reviewed and was negative other than noted in HPI or above.    Objective:   Physical Exam  General appearance: alert, no distress, WD/WN Skin: several small raised whealed lesions 2-26mm diameter on penis, bilat inner thighs, right buttock, suggestive of hives vs possible scabies, vs less likely folliculitis   Assessment and Plan :     Encounter Diagnosis  Name Primary?  . Rash Yes   Script for Permethrin 5% cream.  If this doesn't resolve the rash, consider treating as folliculitis or refer to dermatology.

## 2012-08-23 ENCOUNTER — Ambulatory Visit (INDEPENDENT_AMBULATORY_CARE_PROVIDER_SITE_OTHER): Payer: 59 | Admitting: Medical

## 2012-08-23 ENCOUNTER — Encounter: Payer: Self-pay | Admitting: Medical

## 2012-08-23 VITALS — BP 120/82 | HR 105 | Temp 98.5°F | Resp 18 | Wt 263.0 lb

## 2012-08-23 DIAGNOSIS — J45990 Exercise induced bronchospasm: Secondary | ICD-10-CM

## 2012-08-23 DIAGNOSIS — IMO0002 Reserved for concepts with insufficient information to code with codable children: Secondary | ICD-10-CM

## 2012-08-23 DIAGNOSIS — R21 Rash and other nonspecific skin eruption: Secondary | ICD-10-CM

## 2012-08-23 MED ORDER — ALBUTEROL SULFATE HFA 108 (90 BASE) MCG/ACT IN AERS: 2.0000 | INHALATION_SPRAY | Freq: Four times a day (QID) | RESPIRATORY_TRACT | Status: DC | PRN

## 2012-08-23 NOTE — Progress Notes (Signed)
Subjective: Here for multiple c/o.    Right middle finger with nail bed redness and pain x several days.  He has been putting topical OTC antibiotic ointment and used a pin to pop a pus pocket.  Did have pus drainage.    He notes hx/o exercise induced asthma.  Lately feels chest tightness and SOB mostly in the evenings.  inhaler is expired.  Asks about albuterol treatment nebulized today.  Has several itchy bumps throughout.   He has been treated recently for rash by me that resolved, but this is another new itchy rash.   Objective: Gen: wd, wn, nad  Skin: right middle finger medial dorsal surface of nail bed at base with erythema, fluctuant area;  Small scattered 1mm lesions throughout arms that are erythematous Lungs: CTA, no rhonchi, rales, wheezes Heart: RRR, normal S1, S2, no murmurs  Assessment: Encounter Diagnoses  Name Primary?  . Paronychia Yes  . Exercise-induced asthma   . Rash and nonspecific skin eruption    Plan: Paronychia- discussed diagnosis.  He desires I&D.  Cleaned and prepped in usual sterile procedure.   Used ethyl acetate spray for local anesthesia.  Used #11 blade to incise fluctuant area of nailbed.  Small amount of pus and blood expressed.  Cleaned area and covered with antibiotic ointment and sterile bandage.    Exercise induced asthma - refilled inhaler.  Will set up for PFTs.  Advised he begin Zyrtec 10mg  daily, stop smoking.  Rash - begin zyrtec.  If not improving, refer to dermatology.

## 2012-08-23 NOTE — Patient Instructions (Signed)
Begin zyrtec 10mg  nightly OTC for allergies.  I refilled your albuterol inhaler.   Paronychia infection - use salt water soaks, continue the topical antibiotic ointment.  Call if worse redness or swelling again.  Paronychia Paronychia is an inflammatory reaction involving the folds of the skin surrounding the fingernail. This is commonly caused by an infection in the skin around a nail. The most common cause of paronychia is frequent wetting of the hands (as seen with bartenders, food servers, nurses or others who wet their hands). This makes the skin around the fingernail susceptible to infection by bacteria (germs) or fungus. Other predisposing factors are:  Aggressive manicuring.  Nail biting.  Thumb sucking. The most common cause is a staphylococcal (a type of germ) infection, or a fungal (Candida) infection. When caused by a germ, it usually comes on suddenly with redness, swelling, pus and is often painful. It may get under the nail and form an abscess (collection of pus), or form an abscess around the nail. If the nail itself is infected with a fungus, the treatment is usually prolonged and may require oral medicine for up to one year. Your caregiver will determine the length of time treatment is required. The paronychia caused by bacteria (germs) may largely be avoided by not pulling on hangnails or picking at cuticles. When the infection occurs at the tips of the finger it is called felon. When the cause of paronychia is from the herpes simplex virus (HSV) it is called herpetic whitlow. TREATMENT  When an abscess is present treatment is often incision and drainage. This means that the abscess must be cut open so the pus can get out. When this is done, the following home care instructions should be followed. HOME CARE INSTRUCTIONS   It is important to keep the affected fingers very dry. Rubber or plastic gloves over cotton gloves should be used whenever the hand must be placed in  water.  Keep wound clean, dry and dressed as suggested by your caregiver between warm soaks or warm compresses.  Soak in warm water for fifteen to twenty minutes three to four times per day for bacterial infections. Fungal infections are very difficult to treat, so often require treatment for long periods of time.  For bacterial (germ) infections take antibiotics (medicine which kill germs) as directed and finish the prescription, even if the problem appears to be solved before the medicine is gone.  Only take over-the-counter or prescription medicines for pain, discomfort, or fever as directed by your caregiver. SEEK IMMEDIATE MEDICAL CARE IF:  You have redness, swelling, or increasing pain in the wound.  You notice pus coming from the wound.  You have a fever.  You notice a bad smell coming from the wound or dressing. Document Released: 04/20/2001 Document Revised: 01/17/2012 Document Reviewed: 12/20/2008 [facility] Patient Information 2013 [place], [place].

## 2012-09-14 ENCOUNTER — Other Ambulatory Visit: Payer: Self-pay | Admitting: Medical

## 2012-09-14 NOTE — Telephone Encounter (Signed)
RX REFILL 

## 2012-10-17 ENCOUNTER — Other Ambulatory Visit: Payer: Self-pay | Admitting: Medical

## 2012-10-17 MED ORDER — NAPROXEN 500 MG PO TABS: 500.0000 mg | ORAL_TABLET | Freq: Two times a day (BID) | ORAL | Status: DC

## 2012-10-17 NOTE — Telephone Encounter (Signed)
Rx refill

## 2012-10-18 ENCOUNTER — Institutional Professional Consult (permissible substitution): Payer: 59 | Admitting: Medical

## 2012-10-18 ENCOUNTER — Encounter: Payer: Self-pay | Admitting: Medical

## 2012-10-18 ENCOUNTER — Ambulatory Visit (INDEPENDENT_AMBULATORY_CARE_PROVIDER_SITE_OTHER): Payer: 59 | Admitting: Medical

## 2012-10-18 VITALS — BP 130/82 | Temp 97.7°F | Wt 272.0 lb

## 2012-10-18 DIAGNOSIS — Z113 Encounter for screening for infections with a predominantly sexual mode of transmission: Secondary | ICD-10-CM

## 2012-10-18 DIAGNOSIS — K219 Gastro-esophageal reflux disease without esophagitis: Secondary | ICD-10-CM

## 2012-10-18 DIAGNOSIS — R21 Rash and other nonspecific skin eruption: Secondary | ICD-10-CM

## 2012-10-18 DIAGNOSIS — N489 Disorder of penis, unspecified: Secondary | ICD-10-CM

## 2012-10-18 MED ORDER — HYDROCORTISONE 2.5 % EX CREA: TOPICAL_CREAM | Freq: Two times a day (BID) | CUTANEOUS | Status: DC

## 2012-10-18 MED ORDER — LANSOPRAZOLE 30 MG PO CPDR: 30.0000 mg | DELAYED_RELEASE_CAPSULE | Freq: Every day | ORAL | Status: DC

## 2012-10-18 MED ORDER — PERMETHRIN 5 % EX CREA: TOPICAL_CREAM | Freq: Once | CUTANEOUS | Status: DC

## 2012-10-18 NOTE — Progress Notes (Signed)
Subjective: Wants STD testing.  The prior rash that was thought to be scabies is now gone.  He has some new bumps on penis now, are small.  Been there 2-3 days.  No new sexual partners.  No pain ,no discharge, but there is itching around the base of the penis.  Itching x 2-3 days.  No burning with urination.  Takes Prevacid for reflux.  No particular problems with this.  Needs refills.  Has bumps on right inner thigh and left buttock, thinks they are infected hairs.    Past Medical History  Diagnosis Date  . Anxiety   . Depression   . GERD (gastroesophageal reflux disease)   . Tobacco use disorder   . Gallbladder polyp   . IBS (irritable bowel syndrome)   . Chronic back pain    ROS as in HPI  Objective: Gen: wd, wn, nad Skin GU: dorsal glans penis and left shaft of penis with 3 small patches of erythema with an urticaria appearence, but no obvious vesicles, no induration, several similar slightly raised round 2-3 mm erythematous lesions/rash along bilat posteromedial inner upper thighs  Assessment: Encounter Diagnoses  Name Primary?  . Rash of penis Yes  . Rash and nonspecific skin eruption   . Screen for STD (sexually transmitted disease)   . GERD (gastroesophageal reflux disease)    Plan: Rash appears to be possible bites, could be scabies, fleas.  No obvious cellulitis, no obvious folliculitis.  On the penis specifically, no obvious herpes unless very early stage.  More likely bites like the other rash.  Unfortunately, no vesicle to unroof and swab.  discussed limitations of HSV testing.    Patient Instructions  Use the Permethrin cream topically.  Put on at bedtime, leave on all night and wash off in the morning.  Repeat for 4-7 days.   If still having rash in 1 week, repeat the Permethrin.  During the day, use the Hydrocortisone 2.5% cream topically twice daily to the rashes on the legs, buttocks and use thin layer on the penis rash.  Return if the penis rash appears to  have blisters or worsening.  Prevacid refills sent.

## 2012-10-18 NOTE — Patient Instructions (Signed)
Use the Permethrin cream topically.  Put on at bedtime, leave on all night and wash off in the morning.  Repeat for 4-7 days.   If still having rash in 1 week, repeat the Permethrin.  During the day, use the Hydrocortisone 2.5% cream topically twice daily to the rashes on the legs, buttocks and use thin layer on the penis rash.  Return if the penis rash appears to have blisters or worsening.

## 2012-10-19 LAB — HSV(HERPES SIMPLEX VRS) I + II AB-IGM: Herpes Simplex Vrs I&II-IgM Ab (EIA): 0.31 INDEX

## 2013-01-17 ENCOUNTER — Telehealth: Payer: Self-pay | Admitting: Medical

## 2013-01-17 ENCOUNTER — Other Ambulatory Visit: Payer: Self-pay | Admitting: Medical

## 2013-01-17 MED ORDER — LIDOCAINE 5 % EX PTCH: 1.0000 | MEDICATED_PATCH | CUTANEOUS | Status: DC

## 2013-01-19 NOTE — Telephone Encounter (Signed)
DONE

## 2013-03-07 ENCOUNTER — Encounter: Payer: Self-pay | Admitting: Medical

## 2013-03-07 ENCOUNTER — Ambulatory Visit (INDEPENDENT_AMBULATORY_CARE_PROVIDER_SITE_OTHER): Payer: 59 | Admitting: Medical

## 2013-03-07 VITALS — BP 112/80 | HR 92 | Temp 98.3°F | Resp 18 | Wt 229.0 lb

## 2013-03-07 DIAGNOSIS — R110 Nausea: Secondary | ICD-10-CM

## 2013-03-07 DIAGNOSIS — M79609 Pain in unspecified limb: Secondary | ICD-10-CM

## 2013-03-07 DIAGNOSIS — M549 Dorsalgia, unspecified: Secondary | ICD-10-CM

## 2013-03-07 DIAGNOSIS — S6990XA Unspecified injury of unspecified wrist, hand and finger(s), initial encounter: Secondary | ICD-10-CM

## 2013-03-07 DIAGNOSIS — M79641 Pain in right hand: Secondary | ICD-10-CM

## 2013-03-07 DIAGNOSIS — R1013 Epigastric pain: Secondary | ICD-10-CM

## 2013-03-07 MED ORDER — DEXLANSOPRAZOLE 60 MG PO CPDR: 60.0000 mg | DELAYED_RELEASE_CAPSULE | Freq: Every day | ORAL | Status: DC

## 2013-03-07 MED ORDER — HYDROCODONE-ACETAMINOPHEN 5-325 MG PO TABS: 1.0000 | ORAL_TABLET | Freq: Four times a day (QID) | ORAL | Status: DC | PRN

## 2013-03-07 MED ORDER — LIDOCAINE 5 % EX PTCH: 1.0000 | MEDICATED_PATCH | CUTANEOUS | Status: DC

## 2013-03-07 MED ORDER — SUCRALFATE 1 G PO TABS: 1.0000 g | ORAL_TABLET | Freq: Four times a day (QID) | ORAL | Status: DC

## 2013-03-07 NOTE — Patient Instructions (Signed)
STOP prevacid.  STOP ANY anti-inflammatories (ibuprofen, naprosyn, aleve, aspirin).    Begin Dexilant once daily before breakfast to help treat possible ulcer.   Begin Sucralfate tablets, 45-60 minute prior to meal to coat the stomach.  Avoid alcohol.  Avoid spicy and greasy, acidic foods.  Avoid tomato and citrus foods for now.  Go for xray.

## 2013-03-07 NOTE — Progress Notes (Signed)
Subjective: Here for 2 issues.  He has been having increased back pain.  I have seen him for this prior.  He routinely has been taking Ibuprofen on a regular basis.  He has seen back specialist in the past.  He says he was upset the other day because of the pain and started hitting "studs" in the walls of his house.   Punched the walls repeatedly with his fists.  Now has pain and swelling along both hands, 5th digit area.   He is thinking he may have a fracture.  Mom has made an appt for him to see back specialist soon.  He reports epigastric abdominal pain.  Has lost a lot of weight in the last few months due to decreased appetite and abdominal pain.  Bread sometimes help the pain.  If he waits too long between meals, the pain is worse.  He has nausea.  He has continued to take ibuprofen.   He does report drinking alcohol on occasional, typically 3 drinks at a time - beer or mixed drink with 12% wine and other spirits.  Nothing seems to help the pain of late.  Past Medical History  Diagnosis Date  . Anxiety   . Depression   . GERD (gastroesophageal reflux disease)   . Tobacco use disorder   . Gallbladder polyp   . IBS (irritable bowel syndrome)   . Chronic back pain    ROS as in subjective   Objective: Filed Vitals:   03/07/13 1553  BP: 112/80  Pulse: 92  Temp: 98.3 F (36.8 C)  Resp: 18    General appearance: alert, no distress, WD/WN Oral cavity: MMM, no lesions Neck: supple, no lymphadenopathy, no thyromegaly, no masses Heart: RRR, normal S1, S2, no murmurs Lungs: CTA bilaterally, no wheezes, rhonchi, or rales Abdomen: +bs, soft, epigastric tenderness, some generalized tenderness, non distended, no masses, no hepatomegaly, no splenomegaly MSK: left hand with mild swelling and pain of 5th metacarpal distally, pain with 5th finger ROM, decreased flexion ROM, mild tenderness of right hand 5th metacarpal, otherwise hands, wrists, arms nontender, normal ROM, no obvious  deformity Hands/fingers neurovascularly intact other than decreased flexion strength of 5th left digit  xrays of bilat hands, 3 view, somewhat underexposed with left distal 5th metacarpal with angulation at metaphysis.  Possible fracture, but not clear.  Otherwise no obvious fracture or bony deformity.  Rest of xrays bilat unremarkable  Assessment: Encounter Diagnoses  Name Primary?  . Bilateral hand pain Yes  . Hand trauma, unspecified laterality, initial encounter   . Abdominal pain, epigastric   . Nausea alone   . Back pain      Plan: Hand pain, trauma - advised ice, rest, avoid reinjury, can use lortab prn for few days.  Advised that xray suggests possible boxers fracture, 5th left metacarpal fracture, but not clear on xray.   Will have supervising physician Dr. Susann Givens review and help with plan, possible ortho referral.  Abdominal pain, nausea - possible ulcer given ongoing NSAID use, symptoms and exam.  Advised he stop all NSAIDS.  Change from Prevacid to Dexilant, begin Sucralfate, avoid acidic foods, and recheck in 1wk.  Back pain - f/u as planned with orthopedics/back specialist.  Follow-up pending Dr. Susann Givens review of the xrays, ortho referral

## 2013-03-15 ENCOUNTER — Telehealth: Payer: Self-pay | Admitting: Medical

## 2013-03-16 ENCOUNTER — Telehealth: Payer: Self-pay | Admitting: Internal Medicine

## 2013-03-16 MED ORDER — LIDOCAINE 5 % EX PTCH: 1.0000 | MEDICATED_PATCH | CUTANEOUS | Status: DC

## 2013-03-16 NOTE — Telephone Encounter (Signed)
Mom wanted lidoderm canceled at [facility] and sent to [facility]

## 2013-05-04 NOTE — Telephone Encounter (Signed)
LM

## 2013-06-18 ENCOUNTER — Ambulatory Visit: Payer: 59 | Admitting: Medical

## 2013-08-27 ENCOUNTER — Other Ambulatory Visit: Payer: Self-pay | Admitting: Medical

## 2013-08-27 ENCOUNTER — Telehealth: Payer: Self-pay | Admitting: Medical

## 2013-08-27 MED ORDER — HYDROCORTISONE 2.5 % EX CREA: TOPICAL_CREAM | Freq: Two times a day (BID) | CUTANEOUS | Status: DC

## 2013-08-28 NOTE — Telephone Encounter (Signed)
lm

## 2013-09-02 DIAGNOSIS — F1994 Other psychoactive substance use, unspecified with psychoactive substance-induced mood disorder: Secondary | ICD-10-CM

## 2013-09-02 DIAGNOSIS — F1110 Opioid abuse, uncomplicated: Secondary | ICD-10-CM | POA: Insufficient documentation

## 2013-09-02 DIAGNOSIS — F1320 Sedative, hypnotic or anxiolytic dependence, uncomplicated: Secondary | ICD-10-CM

## 2013-09-02 DIAGNOSIS — F1210 Cannabis abuse, uncomplicated: Secondary | ICD-10-CM

## 2013-09-02 DIAGNOSIS — F1410 Cocaine abuse, uncomplicated: Secondary | ICD-10-CM | POA: Insufficient documentation

## 2013-09-02 DIAGNOSIS — F1010 Alcohol abuse, uncomplicated: Secondary | ICD-10-CM

## 2013-09-02 HISTORY — DX: Cocaine abuse, uncomplicated: F14.10

## 2013-09-02 HISTORY — DX: Other psychoactive substance use, unspecified with psychoactive substance-induced mood disorder: F19.94

## 2013-09-02 HISTORY — DX: Opioid abuse, uncomplicated: F11.10

## 2013-09-02 HISTORY — DX: Cannabis abuse, uncomplicated: F12.10

## 2013-09-02 HISTORY — DX: Sedative, hypnotic or anxiolytic dependence, uncomplicated: F13.20

## 2013-09-02 HISTORY — DX: Alcohol abuse, uncomplicated: F10.10

## 2013-09-26 ENCOUNTER — Telehealth: Payer: Self-pay | Admitting: Medical

## 2013-09-26 MED ORDER — LIDOCAINE 5 % EX PTCH: 1.0000 | MEDICATED_PATCH | CUTANEOUS | Status: DC

## 2013-09-28 ENCOUNTER — Other Ambulatory Visit: Payer: Self-pay | Admitting: Medical

## 2013-09-28 ENCOUNTER — Telehealth: Payer: Self-pay | Admitting: Medical

## 2013-09-28 MED ORDER — METHOCARBAMOL 500 MG PO TABS: 500.0000 mg | ORAL_TABLET | Freq: Every evening | ORAL | Status: DC | PRN

## 2013-09-28 NOTE — Telephone Encounter (Signed)
Due to flare up of back pains, short term Robaxin called out

## 2013-10-02 ENCOUNTER — Encounter: Payer: Self-pay | Admitting: Medical

## 2013-10-02 ENCOUNTER — Ambulatory Visit (INDEPENDENT_AMBULATORY_CARE_PROVIDER_SITE_OTHER): Payer: 59 | Admitting: Medical

## 2013-10-02 ENCOUNTER — Other Ambulatory Visit: Payer: Self-pay | Admitting: Medical

## 2013-10-02 VITALS — BP 102/78 | HR 79 | Temp 98.1°F | Resp 16 | Wt 216.0 lb

## 2013-10-02 DIAGNOSIS — M791 Myalgia, unspecified site: Secondary | ICD-10-CM

## 2013-10-02 DIAGNOSIS — IMO0001 Reserved for inherently not codable concepts without codable children: Secondary | ICD-10-CM

## 2013-10-02 DIAGNOSIS — K589 Irritable bowel syndrome without diarrhea: Secondary | ICD-10-CM

## 2013-10-02 DIAGNOSIS — G894 Chronic pain syndrome: Secondary | ICD-10-CM

## 2013-10-02 DIAGNOSIS — G4700 Insomnia, unspecified: Secondary | ICD-10-CM

## 2013-10-02 DIAGNOSIS — M2550 Pain in unspecified joint: Secondary | ICD-10-CM

## 2013-10-02 LAB — CBC WITH DIFFERENTIAL/PLATELET
Basophils Absolute: 0 10*3/uL (ref 0.0–0.1)
Basophils Relative: 0 % (ref 0–1)
Eosinophils Absolute: 0.2 10*3/uL (ref 0.0–0.7)
Eosinophils Relative: 2 % (ref 0–5)
HCT: 42.3 % (ref 39.0–52.0)
Hemoglobin: 15 g/dL (ref 13.0–17.0)
Lymphs Abs: 2.7 10*3/uL (ref 0.7–4.0)
MCHC: 35.5 g/dL (ref 30.0–36.0)
Neutro Abs: 5.6 10*3/uL (ref 1.7–7.7)
Neutrophils Relative %: 61 % (ref 43–77)
Platelets: 319 10*3/uL (ref 150–400)
RDW: 14.2 % (ref 11.5–15.5)
WBC: 9.2 10*3/uL (ref 4.0–10.5)

## 2013-10-02 MED ORDER — CYCLOBENZAPRINE HCL 10 MG PO TABS: 10.0000 mg | ORAL_TABLET | Freq: Every day | ORAL | Status: DC

## 2013-10-02 NOTE — Telephone Encounter (Signed)
LM

## 2013-10-02 NOTE — Progress Notes (Signed)
Subjective: Here for long hx/o pain.  He notes starting to have pain in early teenage years.  Over the last several years he has went from primarily having back pain to now having muscle aches throughout, joints aches throughout, ongoing back pain.  Just hurts in general.  Gets pains in back from upper to lower, bilat knee pain, bilat wrist pains, pain in MCP and PIPs, pains in ankles.  Cold weather makes it worse.   Has prior seen Dr. Nick[facility] few years back for similar, had negative labs/screen for RA and ankylosing spondylitis, has [facility] for a period of time, has seen orthopedics and chiropractic therapy.  Has been on a variety of medications.  Mom now wonders about fibromyalgia as he has all the right symptoms .  In addition to chronic pains, morning stiffness, he has hx/o IBS constipation dominate, concentration problems, is a smoker, drinks occasionally.  No other aggravating or relieving factors.  No other c/o.    Allergies  Allergen Reactions  . Buspar [Buspirone Hcl]   . Ibuprofen [Ibuprofen] Hives  . Lamictal [Lamotrigine]     Current Outpatient Prescriptions on File Prior to Visit  Medication Sig Dispense Refill  . albuterol (VENTOLIN HFA) 108 (90 BASE) MCG/ACT inhaler Inhale 2 puffs into the lungs every 6 (six) hours as needed for wheezing.  1 Inhaler  0  . alprazolam (XANAX) 2 MG tablet Take 2 mg by mouth at bedtime as needed for sleep.      . clonazePAM (KLONOPIN) 1 MG tablet Take 1 mg by mouth 3 (three) times daily.       . hydrocortisone 2.5 % cream Apply topically 2 (two) times daily.  30 g  0  . lidocaine (LIDODERM) 5 % Place 1 patch onto the skin daily. Remove & Discard patch within 12 hours or as directed by MD  30 patch  0   No current facility-administered medications on file prior to visit.    Past Medical History  Diagnosis Date  . Anxiety   . Depression   . GERD (gastroesophageal reflux disease)   . Tobacco use disorder   .  Gallbladder polyp   . IBS (irritable bowel syndrome)   . Chronic back pain     Past Surgical History  Procedure Laterality Date  . Wisdom tooth extraction      No family history on file.  History   Social History  . Marital Status: Single    Spouse Name: N/A    Number of Children: N/A  . Years of Education: N/A   Occupational History  . Not on file.   Social History Main Topics  . Smoking status: Current Every Day Smoker -- 1.00 packs/day    Types: Cigarettes  . Smokeless tobacco: Never Used  . Alcohol Use: No  . Drug Use: No  . Sexual Activity: Yes    Birth Control/ Protection: None   Other Topics Concern  . Not on file   Social History Narrative  . No narrative on file    Reviewed their medical, surgical, family, social, medication, and allergy history and updated chart as appropriate.   Objective: Filed Vitals:   10/02/13 1627  BP: 102/78  Pulse: 79  Temp: 98.1 F (36.7 C)  Resp: 16    General appearance: alert, no distress, WD/WN Oral cavity: MMM, no lesions Neck: supple, no lymphadenopathy, no thyromegaly, no masses Heart: RRR, normal S1, S2, no murmurs Lungs: CTA bilaterally, no wheezes, rhonchi, or  rales MSK: tender throughout paraspinal muscles, but nontender midline, ROM WNL Back: mild tenderness triceps bilat, tender wrists, tender thighs, but no obvious deformity, normal ROM of UE and LE Pulses: 2+ symmetric, upper and lower extremities, normal cap refill Neuro: nonfocal exam  Assessment: Encounter Diagnoses  Name Primary?  . Chronic pain syndrome Yes  . Myalgia   . Arthralgia   . Insomnia   . IBS (irritable bowel syndrome)      Plan: Reviewed prior rheumatology notes and labs, reviewed other prior records.   Will check some baseline labs today.   Consider fibromyalgia as possible diagnosis vs psychosomatic, vs other.  Consider referral back to rheumatology.  Consider pharmquest study for fibromyalgia.  Follow-up pending labs.

## 2013-10-03 LAB — COMPREHENSIVE METABOLIC PANEL
AST: 13 U/L (ref 0–37)
BUN: 10 mg/dL (ref 6–23)
CO2: 26 mEq/L (ref 19–32)
Calcium: 9.7 mg/dL (ref 8.4–10.5)
Chloride: 105 mEq/L (ref 96–112)
Creat: 0.87 mg/dL (ref 0.50–1.35)
Total Bilirubin: 0.5 mg/dL (ref 0.3–1.2)

## 2013-10-03 LAB — TSH: TSH: 1.809 u[IU]/mL (ref 0.350–4.500)

## 2013-10-09 ENCOUNTER — Ambulatory Visit: Payer: 59 | Admitting: Family Medicine

## 2013-10-12 ENCOUNTER — Other Ambulatory Visit: Payer: Self-pay | Admitting: Medical

## 2013-10-12 MED ORDER — DULOXETINE HCL 30 MG PO CPEP: 30.0000 mg | ORAL_CAPSULE | Freq: Every day | ORAL | Status: DC

## 2013-10-15 ENCOUNTER — Telehealth: Payer: Self-pay | Admitting: Medical

## 2013-10-16 NOTE — Telephone Encounter (Signed)
I don't recall ordering new tests, but I did review his last MRI.  We are treating him for fibromyalgia.  If we are considering other imaging, I need records from where his back issues and pains were being treated prior - ortho, chiro, back specialists, other?  You can have Tammy or Olegario Messier do the records request.

## 2013-10-26 ENCOUNTER — Other Ambulatory Visit: Payer: Self-pay | Admitting: Medical

## 2013-10-26 ENCOUNTER — Telehealth: Payer: Self-pay | Admitting: Medical

## 2013-10-26 MED ORDER — CYCLOBENZAPRINE HCL 10 MG PO TABS: 10.0000 mg | ORAL_TABLET | Freq: Every day | ORAL | Status: DC

## 2013-11-06 ENCOUNTER — Telehealth (INDEPENDENT_AMBULATORY_CARE_PROVIDER_SITE_OTHER): Payer: Self-pay | Admitting: *Deleted

## 2013-11-06 NOTE — Telephone Encounter (Signed)
Pts mother called in asking if Dr. Abbey Chatters could order an abdominal ultrasound prior to the end of the year since their deductible has been met.  I spoke with Cyndra Numbers and because the pt hasn't been seen in the office since his last ultrasound in 2012 she scheduled the pt for an office visit to be seen by Dr. Abbey Chatters prior to him ordering any tests.  I informed her of pts appt on 11/26/13 with an arrival time of 10:30am.  She states she will let pt know.

## 2013-11-09 ENCOUNTER — Ambulatory Visit: Payer: 59 | Admitting: Medical

## 2013-11-10 ENCOUNTER — Emergency Department (HOSPITAL_COMMUNITY)
Admission: EM | Admit: 2013-11-10 | Discharge: 2013-11-10 | Disposition: A | Discharge status: Home / Self Care | Payer: 59 | Attending: Emergency Medicine | Admitting: Emergency Medicine

## 2013-11-10 ENCOUNTER — Encounter (HOSPITAL_COMMUNITY): Payer: Self-pay | Admitting: Emergency Medicine

## 2013-11-10 DIAGNOSIS — Z8719 Personal history of other diseases of the digestive system: Secondary | ICD-10-CM | POA: Insufficient documentation

## 2013-11-10 DIAGNOSIS — F3289 Other specified depressive episodes: Secondary | ICD-10-CM | POA: Insufficient documentation

## 2013-11-10 DIAGNOSIS — R1084 Generalized abdominal pain: Secondary | ICD-10-CM | POA: Insufficient documentation

## 2013-11-10 DIAGNOSIS — F411 Generalized anxiety disorder: Secondary | ICD-10-CM | POA: Insufficient documentation

## 2013-11-10 DIAGNOSIS — F329 Major depressive disorder, single episode, unspecified: Secondary | ICD-10-CM | POA: Insufficient documentation

## 2013-11-10 DIAGNOSIS — G8929 Other chronic pain: Secondary | ICD-10-CM | POA: Insufficient documentation

## 2013-11-10 DIAGNOSIS — Z79899 Other long term (current) drug therapy: Secondary | ICD-10-CM | POA: Insufficient documentation

## 2013-11-10 DIAGNOSIS — R112 Nausea with vomiting, unspecified: Secondary | ICD-10-CM | POA: Insufficient documentation

## 2013-11-10 DIAGNOSIS — R630 Anorexia: Secondary | ICD-10-CM | POA: Insufficient documentation

## 2013-11-10 DIAGNOSIS — IMO0002 Reserved for concepts with insufficient information to code with codable children: Secondary | ICD-10-CM | POA: Insufficient documentation

## 2013-11-10 DIAGNOSIS — A09 Infectious gastroenteritis and colitis, unspecified: Secondary | ICD-10-CM | POA: Insufficient documentation

## 2013-11-10 DIAGNOSIS — F17200 Nicotine dependence, unspecified, uncomplicated: Secondary | ICD-10-CM | POA: Insufficient documentation

## 2013-11-10 LAB — URINALYSIS, ROUTINE W REFLEX MICROSCOPIC
BILIRUBIN URINE: NEGATIVE
GLUCOSE, UA: NEGATIVE mg/dL
Hgb urine dipstick: NEGATIVE
Ketones, ur: NEGATIVE mg/dL
Leukocytes, UA: NEGATIVE
Nitrite: NEGATIVE
PH: 6 (ref 5.0–8.0)
Protein, ur: 30 mg/dL — AB
SPECIFIC GRAVITY, URINE: 1.02 (ref 1.005–1.030)
Urobilinogen, UA: 0.2 mg/dL (ref 0.0–1.0)

## 2013-11-10 LAB — CBC WITH DIFFERENTIAL/PLATELET
BASOS ABS: 0 10*3/uL (ref 0.0–0.1)
BASOS PCT: 0 % (ref 0–1)
Eosinophils Absolute: 0.2 10*3/uL (ref 0.0–0.7)
Eosinophils Relative: 1 % (ref 0–5)
HCT: 46.7 % (ref 39.0–52.0)
HEMOGLOBIN: 17 g/dL (ref 13.0–17.0)
Lymphocytes Relative: 13 % (ref 12–46)
Lymphs Abs: 2.3 10*3/uL (ref 0.7–4.0)
MCH: 32.3 pg (ref 26.0–34.0)
MCHC: 36.4 g/dL — ABNORMAL HIGH (ref 30.0–36.0)
MCV: 88.6 fL (ref 78.0–100.0)
Monocytes Absolute: 1.3 10*3/uL — ABNORMAL HIGH (ref 0.1–1.0)
Monocytes Relative: 8 % (ref 3–12)
NEUTROS ABS: 13.5 10*3/uL — AB (ref 1.7–7.7)
Neutrophils Relative %: 78 % — ABNORMAL HIGH (ref 43–77)
PLATELETS: 332 10*3/uL (ref 150–400)
RBC: 5.27 MIL/uL (ref 4.22–5.81)
RDW: 12.8 % (ref 11.5–15.5)
WBC: 17.4 10*3/uL — ABNORMAL HIGH (ref 4.0–10.5)

## 2013-11-10 LAB — URINE MICROSCOPIC-ADD ON

## 2013-11-10 LAB — COMPREHENSIVE METABOLIC PANEL
ALBUMIN: 4.6 g/dL (ref 3.5–5.2)
ALK PHOS: 127 U/L — AB (ref 39–117)
ALT: 20 U/L (ref 0–53)
AST: 17 U/L (ref 0–37)
BILIRUBIN TOTAL: 0.5 mg/dL (ref 0.3–1.2)
BUN: 10 mg/dL (ref 6–23)
CHLORIDE: 101 meq/L (ref 96–112)
CO2: 24 mEq/L (ref 19–32)
Calcium: 9.8 mg/dL (ref 8.4–10.5)
Creatinine, Ser: 0.72 mg/dL (ref 0.50–1.35)
GFR calc Af Amer: >90 mL/min (ref >90)
GFR calc non Af Amer: >90 mL/min (ref >90)
Glucose, Bld: 130 mg/dL — ABNORMAL HIGH (ref 70–99)
POTASSIUM: 3.7 meq/L (ref 3.7–5.3)
SODIUM: 140 meq/L (ref 137–147)
TOTAL PROTEIN: 8 g/dL (ref 6.0–8.3)

## 2013-11-10 LAB — LIPASE, BLOOD: Lipase: 20 U/L (ref 11–59)

## 2013-11-10 MED ORDER — DIPHENHYDRAMINE HCL 50 MG/ML IJ SOLN
25.0000 mg | Freq: Once | INTRAMUSCULAR | Status: AC
  Administered 2013-11-10: 25 mg via INTRAVENOUS
  Filled 2013-11-10: qty 1

## 2013-11-10 MED ORDER — METOCLOPRAMIDE HCL 10 MG PO TABS: 10.0000 mg | ORAL_TABLET | Freq: Four times a day (QID) | ORAL | Status: DC

## 2013-11-10 MED ORDER — ONDANSETRON 4 MG PO TBDP
8.0000 mg | ORAL_TABLET | Freq: Once | ORAL | Status: AC
  Administered 2013-11-10: 8 mg via ORAL
  Filled 2013-11-10: qty 2

## 2013-11-10 MED ORDER — SODIUM CHLORIDE 0.9 % IV BOLUS (SEPSIS)
1000.0000 mL | Freq: Once | INTRAVENOUS | Status: AC
  Administered 2013-11-10: 1000 mL via INTRAVENOUS

## 2013-11-10 NOTE — ED Provider Notes (Signed)
CSN: [id number]     Arrival date & time 11/10/13  1458 History   First MD Initiated Contact with Patient 11/10/13 1531     Chief Complaint  Patient presents with  . Abdominal Pain  . Emesis  . Diarrhea   (Consider location/radiation/quality/duration/timing/severity/associated sxs/prior Treatment) Patient is a 23 y.o. male presenting with abdominal pain.  Abdominal Pain Pain location:  Generalized Pain quality: aching   Pain radiates to:  Does not radiate Pain severity:  Severe Onset quality:  Gradual Duration:  3 days Timing:  Constant Progression:  Waxing and waning Chronicity:  New Context: suspicious food intake (recently had home-made pork sausage given to family for Christmas)   Relieved by:  Nothing Associated symptoms: anorexia, diarrhea, nausea and vomiting   Associated symptoms: no chest pain, no chills, no cough, no dysuria, no fever, no shortness of breath and no sore throat     Past Medical History  Diagnosis Date  . Anxiety   . Depression   . GERD (gastroesophageal reflux disease)   . Tobacco use disorder   . Gallbladder polyp   . IBS (irritable bowel syndrome)   . Chronic back pain    Past Surgical History  Procedure Laterality Date  . Wisdom tooth extraction     History reviewed. No pertinent family history. History  Substance Use Topics  . Smoking status: Current Every Day Smoker -- 1.00 packs/day    Types: Cigarettes  . Smokeless tobacco: Never Used  . Alcohol Use: No    Review of Systems  Constitutional: Negative for fever and chills.  HENT: Negative for sore throat.   Eyes: Negative for pain.  Respiratory: Negative for cough and shortness of breath.   Cardiovascular: Negative for chest pain.  Gastrointestinal: Positive for nausea, vomiting, abdominal pain, diarrhea and anorexia.  Genitourinary: Negative for dysuria and flank pain.  Musculoskeletal: Negative for back pain and neck pain.  Skin: Negative for rash.  Neurological: Negative for  seizures and headaches.    Allergies  Zofran; Buspar; Ibuprofen; and Lamictal  Home Medications   Current Outpatient Rx  Name  Route  Sig  Dispense  Refill  . albuterol (VENTOLIN HFA) 108 (90 BASE) MCG/ACT inhaler   Inhalation   Inhale 2 puffs into the lungs every 6 (six) hours as needed for wheezing.   1 Inhaler   0   . alprazolam (XANAX) 2 MG tablet   Oral   Take 2 mg by mouth at bedtime as needed for sleep.         . clonazePAM (KLONOPIN) 1 MG tablet   Oral   Take 1 mg by mouth 3 (three) times daily.          . cyclobenzaprine (FLEXERIL) 10 MG tablet   Oral   Take 1 tablet (10 mg total) by mouth at bedtime.   20 tablet   0   . hydrocortisone 2.5 % cream   Topical   Apply topically 2 (two) times daily.   30 g   0   . lidocaine (LIDODERM) 5 %   Transdermal   Place 1 patch onto the skin daily. Remove & Discard patch within 12 hours or as directed by MD   30 patch   0   . DULoxetine (CYMBALTA) 30 MG capsule   Oral   Take 1 capsule (30 mg total) by mouth daily.   30 capsule   2   . metoCLOPramide (REGLAN) 10 MG tablet   Oral   Take 1 tablet (  10 mg total) by mouth every 6 (six) hours.   30 tablet   0    BP 103/68  Pulse 90  Temp(Src) 97.7 F (36.5 C) (Oral)  Resp 18  SpO2 96% Physical Exam  Constitutional: He is oriented to person, place, and time. He appears well-developed and well-nourished. No distress.  HENT:  Head: Normocephalic and atraumatic.  Eyes: Pupils are equal, round, and reactive to light.  Neck: Normal range of motion.  Cardiovascular: Normal rate and regular rhythm.   Pulmonary/Chest: Effort normal and breath sounds normal.  Abdominal: Soft. He exhibits no distension. There is no tenderness. There is no rigidity, no rebound, no guarding and no tenderness at McBurney's point.  Musculoskeletal: Normal range of motion.  Neurological: He is alert and oriented to person, place, and time.  Skin: Skin is warm. He is not diaphoretic.     ED Course  Procedures (including critical care time) Labs Review Labs Reviewed  CBC WITH DIFFERENTIAL - Abnormal; Notable for the following:    WBC 17.4 (*)    MCHC 36.4 (*)    Neutrophils Relative % 78 (*)    Neutro Abs 13.5 (*)    Monocytes Absolute 1.3 (*)    All other components within normal limits  COMPREHENSIVE METABOLIC PANEL - Abnormal; Notable for the following:    Glucose, Bld 130 (*)    Alkaline Phosphatase 127 (*)    All other components within normal limits  URINALYSIS, ROUTINE W REFLEX MICROSCOPIC - Abnormal; Notable for the following:    APPearance TURBID (*)    Protein, ur 30 (*)    All other components within normal limits  LIPASE, BLOOD  URINE MICROSCOPIC-ADD ON   Imaging Review No results found.  EKG Interpretation   None       MDM   1. Infectious diarrhea in adult patient    23 yo M with abdominal pain, n/v/d. Soft abdomen, nontender.   Likely viral gastro vs food poisoning. Labs significant for leukocytosis. No other acute findings on labs at this time. Patient with mild improvement after fluids, antiemetics. Patient with mild allergic reaction to zofran. Discussed symptomatic management, fluids at home. Strict return precautions given. Patient discharged to home with reglan, recommended NSAIDs/tylenol for pain control. Recommended PCP follow-up. Discharged to home in stable condition. Patient seen and evaluated by myself and my attending, Dr. Vanita Panda.      Freddi Che, MD 11/11/13 0005

## 2013-11-10 NOTE — ED Notes (Signed)
MD at bedside. 

## 2013-11-10 NOTE — Discharge Instructions (Signed)
Diarrhea Diarrhea is frequent loose and watery bowel movements. It can cause you to feel weak and dehydrated. Dehydration can cause you to become tired and thirsty, have a dry mouth, and have decreased urination that often is dark yellow. Diarrhea is a sign of another problem, most often an infection that will not last long. In most cases, diarrhea typically lasts 2 3 days. However, it can last longer if it is a sign of something more serious. It is important to treat your diarrhea as directed by your caregive to lessen or prevent future episodes of diarrhea. CAUSES  Some common causes include:  Gastrointestinal infections caused by viruses, bacteria, or parasites.  Food poisoning or food allergies.  Certain medicines, such as antibiotics, chemotherapy, and laxatives.  Artificial sweeteners and fructose.  Digestive disorders. HOME CARE INSTRUCTIONS  Ensure adequate fluid intake (hydration): have 1 cup (8 oz) of fluid for each diarrhea episode. Avoid fluids that contain simple sugars or sports drinks, fruit juices, whole milk products, and sodas. Your urine should be clear or pale yellow if you are drinking enough fluids. Hydrate with an oral rehydration solution that you can purchase at pharmacies, retail stores, and online. You can prepare an oral rehydration solution at home by mixing the following ingredients together:    tsp table salt.   tsp baking soda.   tsp salt substitute containing potassium chloride.  1  tablespoons sugar.  1 L (34 oz) of water.  Certain foods and beverages may increase the speed at which food moves through the gastrointestinal (GI) tract. These foods and beverages should be avoided and include:  Caffeinated and alcoholic beverages.  High-fiber foods, such as raw fruits and vegetables, nuts, seeds, and whole grain breads and cereals.  Foods and beverages sweetened with sugar alcohols, such as xylitol, sorbitol, and mannitol.  Some foods may be well  tolerated and may help thicken stool including:  Starchy foods, such as rice, toast, pasta, low-sugar cereal, oatmeal, grits, baked potatoes, crackers, and bagels.  Bananas.  Applesauce.  Add probiotic-rich foods to help increase healthy bacteria in the GI tract, such as yogurt and fermented milk products.  Wash your hands well after each diarrhea episode.  Only take over-the-counter or prescription medicines as directed by your caregiver.  Take a warm bath to relieve any burning or pain from frequent diarrhea episodes. SEEK IMMEDIATE MEDICAL CARE IF:   You are unable to keep fluids down.  You have persistent vomiting.  You have blood in your stool, or your stools are black and tarry.  You do not urinate in 6 8 hours, or there is only a small amount of very dark urine.  You have abdominal pain that increases or localizes.  You have weakness, dizziness, confusion, or lightheadedness.  You have a severe headache.  Your diarrhea gets worse or does not get better.  You have a fever or persistent symptoms for more than 2 3 days.  You have a fever and your symptoms suddenly get worse. MAKE SURE YOU:   Understand these instructions.  Will watch your condition.  Will get help right away if you are not doing well or get worse. Document Released: 10/15/2002 Document Revised: 10/11/2012 Document Reviewed: 07/02/2012 [facility] Patient Information 2014 [place], [place].  Antibiotic Nonuse  Your caregiver felt that the infection or problem was not one that would be helped with an antibiotic. Infections may be caused by viruses or bacteria. Only a caregiver can tell which one of these is the likely cause  of an illness. A cold is the most common cause of infection in both adults and children. A cold is a virus. Antibiotic treatment will have no effect on a viral infection. Viruses can lead to many lost days of work caring for sick children and many missed days of school. Children may  catch as many as 10 "colds" or "flus" per year during which they can be tearful, cranky, and uncomfortable. The goal of treating a virus is aimed at keeping the ill person comfortable. Antibiotics are medications used to help the body fight bacterial infections. There are relatively few types of bacteria that cause infections but there are hundreds of viruses. While both viruses and bacteria cause infection they are very different types of germs. A viral infection will typically go away by itself within 7 to 10 days. Bacterial infections may spread or get worse without antibiotic treatment. Examples of bacterial infections are:  Sore throats (like strep throat or tonsillitis).  Infection in the lung (pneumonia).  Ear and skin infections. Examples of viral infections are:  Colds or flus.  Most coughs and bronchitis.  Sore throats not caused by Strep.  Runny noses. It is often best not to take an antibiotic when a viral infection is the cause of the problem. Antibiotics can kill off the helpful bacteria that we have inside our body and allow harmful bacteria to start growing. Antibiotics can cause side effects such as allergies, nausea, and diarrhea without helping to improve the symptoms of the viral infection. Additionally, repeated uses of antibiotics can cause bacteria inside of our body to become resistant. That resistance can be passed onto harmful bacterial. The next time you have an infection it may be harder to treat if antibiotics are used when they are not needed. Not treating with antibiotics allows our own immune system to develop and take care of infections more efficiently. Also, antibiotics will work better for [place] when they are prescribed for bacterial infections. Treatments for a child that is ill may include:  Give extra fluids throughout the day to stay hydrated.  Get plenty of rest.  Only give your child over-the-counter or prescription medicines for pain, discomfort, or  fever as directed by your caregiver.  The use of a cool mist humidifier may help stuffy noses.  Cold medications if suggested by your caregiver. Your caregiver may decide to start you on an antibiotic if:  The problem you were seen for today continues for a longer length of time than expected.  You develop a secondary bacterial infection. SEEK MEDICAL CARE IF:  Fever lasts longer than 5 days.  Symptoms continue to get worse after 5 to 7 days or become severe.  Difficulty in breathing develops.  Signs of dehydration develop (poor drinking, rare urinating, dark colored urine).  Changes in behavior or worsening tiredness (listlessness or lethargy). Document Released: 01/03/2002 Document Revised: 01/17/2012 Document Reviewed: 07/02/2009 [facility] Patient Information 2014 [place], [place].

## 2013-11-10 NOTE — ED Notes (Signed)
Pt and family member reports generalized abd pain, n/v/d x 3 days and feeling lightheaded. Denies fever/chills.

## 2013-11-11 NOTE — ED Provider Notes (Signed)
This patient was seen in conjunction with the resident physician, Dr. Silvio Clayman. The documentation is accurate and reflects our evaluation of the patient, and the clinical course.  On my exam: This young male presents with several days of waxing and waning abdominal discomfort.  On exam is awake, alert, in no distress.  However, with this concern of abdominal pain he received fluid hydration.  Patient has been on peritoneal abdomen, was ambulatory throughout his emergency department course, and improved here.  Patient's presentation is most likely consistent with antritis was acute food reaction.  Absent focal tenderness, or an acute abdomen, there is no indication for additional imaging.  With absence of fever, distress, and is appropriate for discharge with outpatient management.  Carmin Muskrat, MD 11/11/13 [phone]

## 2013-11-20 DIAGNOSIS — Z0271 Encounter for disability determination: Secondary | ICD-10-CM

## 2013-11-26 ENCOUNTER — Ambulatory Visit (INDEPENDENT_AMBULATORY_CARE_PROVIDER_SITE_OTHER): Payer: Self-pay | Admitting: General Surgery

## 2013-12-03 ENCOUNTER — Telehealth: Payer: Self-pay | Admitting: Medical

## 2013-12-06 ENCOUNTER — Other Ambulatory Visit: Payer: Self-pay | Admitting: Medical

## 2013-12-06 ENCOUNTER — Telehealth: Payer: Self-pay | Admitting: Medical

## 2013-12-06 MED ORDER — DULOXETINE HCL 30 MG PO CPEP: 30.0000 mg | ORAL_CAPSULE | Freq: Two times a day (BID) | ORAL | Status: DC

## 2013-12-06 NOTE — Telephone Encounter (Signed)
lm

## 2013-12-06 NOTE — Telephone Encounter (Signed)
i increased to 30mg  BID.  I would like to see him back in 3-4 wk on this higher dose, 30 min appt, and need paper chart records to review the day before.

## 2013-12-10 ENCOUNTER — Telehealth: Payer: Self-pay | Admitting: Internal Medicine

## 2013-12-10 MED ORDER — DULOXETINE HCL 30 MG PO CPEP: 30.0000 mg | ORAL_CAPSULE | Freq: Two times a day (BID) | ORAL | Status: DC

## 2013-12-10 NOTE — Telephone Encounter (Signed)
P.A. Approved for Duloxetine 30 mg bid, pt informed

## 2013-12-10 NOTE — Telephone Encounter (Signed)
Sent med to [place] and cancelled the med at [facility]

## 2013-12-13 ENCOUNTER — Telehealth: Payer: Self-pay | Admitting: Medical

## 2013-12-14 ENCOUNTER — Other Ambulatory Visit: Payer: Self-pay | Admitting: Medical

## 2013-12-14 MED ORDER — CYCLOBENZAPRINE HCL 10 MG PO TABS: 10.0000 mg | ORAL_TABLET | Freq: Every day | ORAL | Status: DC

## 2013-12-14 NOTE — Telephone Encounter (Signed)
Refill sent.  Lets f/u OV soon.

## 2013-12-19 NOTE — Telephone Encounter (Signed)
lm

## 2014-01-03 ENCOUNTER — Telehealth: Payer: Self-pay | Admitting: Internal Medicine

## 2014-01-03 NOTE — Telephone Encounter (Signed)
Faxed over medical records to Disability determination service @ [phone]

## 2014-01-04 ENCOUNTER — Other Ambulatory Visit: Payer: Self-pay | Admitting: Medical

## 2014-01-04 ENCOUNTER — Telehealth: Payer: Self-pay | Admitting: Medical

## 2014-01-04 NOTE — Telephone Encounter (Signed)
done

## 2014-01-04 NOTE — Telephone Encounter (Signed)
IS THIS OKAY TO REFILL 

## 2014-01-11 ENCOUNTER — Telehealth: Payer: Self-pay | Admitting: Medical

## 2014-01-11 ENCOUNTER — Other Ambulatory Visit: Payer: Self-pay | Admitting: Medical

## 2014-01-11 MED ORDER — CYCLOBENZAPRINE HCL 10 MG PO TABS: 10.0000 mg | ORAL_TABLET | Freq: Every day | ORAL | Status: DC

## 2014-01-11 NOTE — Telephone Encounter (Signed)
rx sent to [facility] given it is now 5:20pm

## 2014-01-30 ENCOUNTER — Encounter: Payer: Self-pay | Admitting: Medical

## 2014-01-30 ENCOUNTER — Ambulatory Visit (INDEPENDENT_AMBULATORY_CARE_PROVIDER_SITE_OTHER): Payer: 59 | Admitting: Medical

## 2014-01-30 VITALS — BP 110/80 | HR 78 | Temp 97.5°F | Resp 14 | Ht 75.0 in | Wt 208.0 lb

## 2014-01-30 DIAGNOSIS — M549 Dorsalgia, unspecified: Secondary | ICD-10-CM | POA: Insufficient documentation

## 2014-01-30 DIAGNOSIS — B070 Plantar wart: Secondary | ICD-10-CM

## 2014-01-30 DIAGNOSIS — R748 Abnormal levels of other serum enzymes: Secondary | ICD-10-CM

## 2014-01-30 DIAGNOSIS — G8929 Other chronic pain: Secondary | ICD-10-CM

## 2014-01-30 DIAGNOSIS — K5900 Constipation, unspecified: Secondary | ICD-10-CM

## 2014-01-30 DIAGNOSIS — K589 Irritable bowel syndrome without diarrhea: Secondary | ICD-10-CM

## 2014-01-30 DIAGNOSIS — F319 Bipolar disorder, unspecified: Secondary | ICD-10-CM

## 2014-01-30 DIAGNOSIS — G4700 Insomnia, unspecified: Secondary | ICD-10-CM

## 2014-01-30 DIAGNOSIS — R1013 Epigastric pain: Secondary | ICD-10-CM

## 2014-01-30 DIAGNOSIS — Z0000 Encounter for general adult medical examination without abnormal findings: Secondary | ICD-10-CM

## 2014-01-30 DIAGNOSIS — R7309 Other abnormal glucose: Secondary | ICD-10-CM

## 2014-01-30 DIAGNOSIS — Z23 Encounter for immunization: Secondary | ICD-10-CM

## 2014-01-30 HISTORY — DX: Insomnia, unspecified: G47.00

## 2014-01-30 HISTORY — DX: Dorsalgia, unspecified: M54.9

## 2014-01-30 HISTORY — DX: Bipolar disorder, unspecified: F31.9

## 2014-01-30 LAB — COMPREHENSIVE METABOLIC PANEL
ALBUMIN: 4.7 g/dL (ref 3.5–5.2)
ALT: 12 U/L (ref 0–53)
AST: 14 U/L (ref 0–37)
Alkaline Phosphatase: 116 U/L (ref 39–117)
BILIRUBIN TOTAL: 0.5 mg/dL (ref 0.2–1.2)
BUN: 12 mg/dL (ref 6–23)
CO2: 29 mEq/L (ref 19–32)
Calcium: 9.6 mg/dL (ref 8.4–10.5)
Chloride: 104 mEq/L (ref 96–112)
Creat: 0.87 mg/dL (ref 0.50–1.35)
Glucose, Bld: 65 mg/dL — ABNORMAL LOW (ref 70–99)
POTASSIUM: 4.1 meq/L (ref 3.5–5.3)
SODIUM: 141 meq/L (ref 135–145)
Total Protein: 7 g/dL (ref 6.0–8.3)

## 2014-01-30 LAB — LIPID PANEL
CHOL/HDL RATIO: 4.4 ratio
Cholesterol: 160 mg/dL (ref 0–200)
HDL: 36 mg/dL — AB (ref >39)
LDL Cholesterol: 102 mg/dL — ABNORMAL HIGH (ref 0–99)
Triglycerides: 112 mg/dL (ref <150)
VLDL: 22 mg/dL (ref 0–40)

## 2014-01-30 LAB — HEMOGLOBIN A1C
Hgb A1c MFr Bld: 5.4 % (ref <5.7)
Mean Plasma Glucose: 108 mg/dL (ref <117)

## 2014-01-30 MED ORDER — DEXLANSOPRAZOLE 60 MG PO CPDR: 60.0000 mg | DELAYED_RELEASE_CAPSULE | Freq: Every day | ORAL | Status: DC

## 2014-01-30 MED ORDER — LIDOCAINE 5 % EX PTCH: 1.0000 | MEDICATED_PATCH | CUTANEOUS | Status: DC

## 2014-01-30 MED ORDER — OMEPRAZOLE 40 MG PO CPDR: 40.0000 mg | DELAYED_RELEASE_CAPSULE | Freq: Every day | ORAL | Status: DC

## 2014-01-30 MED ORDER — DULOXETINE HCL 60 MG PO CPEP: 60.0000 mg | ORAL_CAPSULE | Freq: Two times a day (BID) | ORAL | Status: DC

## 2014-01-30 NOTE — Assessment & Plan Note (Signed)
This has been a long term problem that seems to be contributing to his pain and other problems.   Prior medications include - currently Valium and Xanax, in the past Ativan, Klonopin, Rozerem, Trazodone.  Has not been on Ambien and Lunesta.  Didn't tolerate Latuda.  Bedtime for him is around 11pm - 1am.

## 2014-01-30 NOTE — Progress Notes (Signed)
Subjective:   HPI  Carl Hughes is a 23 y.o. male who presents for a complete physical.  Of note, I work with his mother Carl Hughes here.  I have primarily seen him for acute issues, but he is here for his first physical with me today.  Medical care team includes:  Psychiatry - Dr. Pearson Grippe   Dorothea Ogle, PA-C primary care here  Ophthalmology, Dr. Lindalou Hose  Dental - Dr. Burt Knack  Prior back eval and treatment with orthopedist, [facility] [place], chiropractor   Preventative care: Last ophthalmology visit:yes Dr. Lindalou Hose Last dental visit:yes Dr. Burt Knack Last colonoscopy:n/a Last prostate exam: n/a Last EKG:yes / here Last labs:11/10/2013  Prior vaccinations: TD or Tdap:04/2011 Influenza: ? Pneumococcal:n/a Shingles/Zostavax:n/a  Advanced directive:n/a Health care power of attorney:n/a Living will:n/a  Concerns: He has a list of concerns  Insomnia, chronic back pain, constipation, IBS, tender areas of left foot, "stomach ulcer"  Sees psychiatry every 6 week for bipolar, depression, anxiety. At one point was on 27 medications.   Chronic back pain Has been seeing me for a few months, but has long hx/o back pain, has seen ortho and back specialist in the past.  On cymbalta 30mg  BID but feels like it helps some but not helping as much as desired.     Insomnia This has been a long term problem that seems to be contributing to his pain and other problems.   Prior medications include - currently Valium and Xanax, in the past Ativan, Klonopin, Rozerem, Trazodone.  Has not been on Ambien and Lunesta.  Didn't tolerate Latuda.  Bedtime for him is around 11pm - 1am.     Constipation- intermittent, hx/o IBS  Plantar wart of left foot.  Using nothing for this.    Bad epigastric pain that he atttributes to ulcer.  No prior EGD.  Using nothing.  Omeprazole hasn't worked prior.   Reviewed their medical, surgical, family, social, medication, and allergy history and updated  chart as appropriate.  Past Medical History  Diagnosis Date  . Anxiety   . Depression   . GERD (gastroesophageal reflux disease)   . Tobacco use disorder   . Gallbladder polyp   . IBS (irritable bowel syndrome)   . Chronic back pain   . Exercise-induced asthma   . Chronic back pain   . Polyarthralgia   . Bipolar disorder     Dr. Pearson Grippe, [facility]  . Wears glasses     Past Surgical History  Procedure Laterality Date  . Wisdom tooth extraction      History   Social History  . Marital Status: Single    Spouse Name: N/A    Number of Children: N/A  . Years of Education: N/A   Occupational History  . Not on file.   Social History Main Topics  . Smoking status: Current Every Day Smoker -- 0.50 packs/day for 7 years    Types: Cigarettes  . Smokeless tobacco: Never Used  . Alcohol Use: 4.2 oz/week    7 Cans of beer per week  . Drug Use: Yes    Special: Marijuana  . Sexual Activity: Yes    Birth Control/ Protection: None   Other Topics Concern  . Not on file   Social History Narrative   No relationship currently.   Unemployed.   Hobbies - none.  Enjoys his puppy    Family History  Problem Relation Age of Onset  . Depression Father   . Other Father  chronic back pain  . Arthritis Father   . Gout Father   . Heart disease Maternal Grandmother   . Diabetes Maternal Grandfather   . Cancer Maternal Grandfather     colorectal  . Hypertension Maternal Grandfather   . Hyperlipidemia Maternal Grandfather   . Heart disease Paternal Grandfather     Current outpatient prescriptions:albuterol (VENTOLIN HFA) 108 (90 BASE) MCG/ACT inhaler, Inhale 2 puffs into the lungs every 6 (six) hours as needed for wheezing., Disp: 1 Inhaler, Rfl: 0;  alprazolam (XANAX) 2 MG tablet, Take 2 mg by mouth at bedtime as needed for sleep., Disp: , Rfl: ;  cyclobenzaprine (FLEXERIL) 10 MG tablet, Take 1 tablet (10 mg total) by mouth at bedtime., Disp: 20 tablet,  Rfl: 0 diazepam (VALIUM) 2 MG tablet, Take 2 mg by mouth 2 (two) times daily., Disp: , Rfl: ;  lidocaine (LIDODERM) 5 %, Place 1 patch onto the skin daily. Remove & Discard patch within 12 hours or as directed by MD, Disp: 30 patch, Rfl: 0;  dexlansoprazole (DEXILANT) 60 MG capsule, Take 1 capsule (60 mg total) by mouth daily., Disp: 30 capsule, Rfl: 2 DULoxetine (CYMBALTA) 60 MG capsule, Take 1 capsule (60 mg total) by mouth 2 (two) times daily., Disp: 180 capsule, Rfl: 0  Allergies  Allergen Reactions  . Zofran [Ondansetron Hcl] Rash  . Buspar [Buspirone Hcl]   . Ibuprofen [Ibuprofen] Hives  . Lamictal [Lamotrigine]        Review of Systems Constitutional: -fever, -chills, -sweats, -unexpected weight change, -decreased appetite, +fatigue Allergy: -sneezing, -itching, -congestion Dermatology: -changing moles, --rash, +lumps(bottom of his feet) ENT: -runny nose, -ear pain, -sore throat, -hoarseness, -sinus pain, -teeth pain, - ringing in ears, -hearing loss, -nosebleeds Cardiology: +chest pain(anxiety), -palpitations, -swelling, -difficulty breathing when lying flat, -waking up short of breath Respiratory: -cough, +shortness of breath, -difficulty breathing with exercise or exertion, +wheezing, -coughing up blood Gastroenterology: +abdominal pain, -nausea, -vomiting, -diarrhea, +constipation, -blood in stool, -changes in bowel movement, -difficulty swallowing or eating Hematology: -bleeding, -bruising  Musculoskeletal: +joint aches, -muscle aches, -joint swelling, +back pain, +neck pain, -cramping, -changes in gait Ophthalmology: denies vision changes, eye redness, itching, discharge Urology: -burning with urination, -difficulty urinating, -blood in urine, -urinary frequency, -urgency, -incontinence Neurology: -headache, -weakness, +tingling, +numbness, -memory loss, -falls, -dizziness Psychology: +depressed mood, -agitation, +sleep problems     Objective:   Physical Exam  BP 110/80   Pulse 78  Temp(Src) 97.5 F (36.4 C) (Oral)  Resp 14  Ht 6\' 3"  (1.905 m)  Wt 208 lb (94.348 kg)  BMI 26.00 kg/m2  General appearance: alert, no distress, WD/WN, white male, has lost considerable weight since last visit Skin: left posterolateral flank with scar, otherwise scattered macules, tattoo right distal anterior thigh with his initials HEENT: normocephalic, conjunctiva/corneas normal, sclerae anicteric, PERRLA, EOMi, nares patent, no discharge or erythema, pharynx normal Oral cavity: MMM, tongue normal, teeth in good repair Neck: supple, no lymphadenopathy, no thyromegaly, no masses, normal ROM, no bruits Chest: non tender, normal shape and expansion Heart: RRR, normal S1, S2, no murmurs Lungs: CTA bilaterally, no wheezes, rhonchi, or rales Abdomen: +bs, soft, non tender, non distended, no masses, no hepatomegaly, no splenomegaly, no bruits Back: mild lumbar paraspinal tenderness, pain with flexion limited to 90 degrees, otherwise non tender, normal ROM, no scoliosis, -SLR Musculoskeletal: upper extremities non tender, no obvious deformity, normal ROM throughout, lower extremities non tender, no obvious deformity, normal ROM throughout Extremities: no edema, no cyanosis, no clubbing Pulses: 2+ symmetric, upper  and lower extremities, normal cap refill Neurological: alert, oriented x 3, CN2-12 intact, strength normal upper extremities and lower extremities, sensation normal throughout, DTRs 2+ throughout, no cerebellar signs, gait normal, normal heel and toe walk Psychiatric: normal affect, behavior normal, pleasant  GU: normal male external genitalia, circumcised, nontender, no masses, no hernia, no lymphadenopathy Rectal: deferred   Assessment and Plan :    Encounter Diagnoses  Name Primary?  . Routine general medical examination at a health care facility Yes  . Chronic back pain   . Insomnia   . Bipolar disorder, unspecified   . Elevated glucose   . Alkaline phosphatase  elevation   . Need for HPV vaccination   . Unspecified constipation   . IBS (irritable bowel syndrome)   . Plantar wart     Physical exam - discussed healthy lifestyle, diet, exercise, preventative care, vaccinations, and addressed their concerns.    Counseled on the Human Papilloma virus vaccine.  Vaccine information sheet given.  HPV vaccine given after consent obtained.  Patient was advised to return in 2 months for HPV #2, and in 6 months for HPV #3.    We had a long discussion about his current and prior treatment and eval for chronic back pain and insomnia.  He declines PT referral for back pain although I strongly recommend this.  Interestingly his exam is not too abnormal, yet he seems to wince in pain, gets up a few times during the appointment due to pain in back, moving around.  I reviewed prior ortho, chiropractic eval and management notes.    Specific recommendations today include:  Chronic back pain-increase Cymbalta to 60 mg in the morning, continue 30 mg for the next week, then increase to 60 mg twice daily.  Continue daily stretching exercise routine.  Flexeril as needed, but not daily.  Lidoderm patches daily. Strongly recommend we consider physical therapy, water aerobics, swimming, or other supervised therapy for exercise  Insomnia-talk to your psychiatrist about trazodone or Belsomra.  See handout below.  Bipolar - see psychiatry as usual  Alkaline phsophatate and glucose elevation - additional labs today  HPV vaccine - return in 2 months for HPV #2  Constipation - get 20g of fiber daily in diet, increase water intake  IBS - as needed Imodium OTC  Epigastric pain - begin Dexilant once daily in the morning for "ulcer".  Avoid anti-inflammatories  Plantar wart - see handout below  Follow-up pending labs

## 2014-01-30 NOTE — Assessment & Plan Note (Signed)
Has been seeing me for a few months, but has long hx/o back pain, has seen ortho and back specialist in the past.  On cymbalta 30mg  BID but feels like it helps some but not helping as much as desired.

## 2014-01-30 NOTE — Patient Instructions (Addendum)
Thank you for giving me the opportunity to serve you today.    Your diagnosis today includes: Encounter Diagnoses  Name Primary?  . Routine general medical examination at a health care facility Yes  . Chronic back pain   . Insomnia   . Bipolar disorder, unspecified   . Elevated glucose   . Alkaline phosphatase elevation   . Need for HPV vaccination   . Unspecified constipation   . IBS (irritable bowel syndrome)   . Plantar wart   . Abdominal pain, epigastric      Specific recommendations today include:  Chronic back pain-increase Cymbalta to 60 mg in the morning, continue 30 mg for the next week, then increase to 60 mg twice daily.  Continue daily stretching exercise routine.  Flexeril as needed, but not daily.  Lidoderm patches daily. Strongly recommend we consider physical therapy, water aerobics, swimming, or other supervised therapy for exercise  Insomnia-talk to your psychiatrist about trazodone or Belsomra.  See handout below.  Bipolar - see psychiatry as usual  Alkaline phsophatate and glucose elevation - additional labs today  HPV vaccine - return in 2 months for HPV #2  Constipation - get 20g of fiber daily in diet, increase water intake  IBS - as needed Imodium OTC  Epigastric pain - begin Dexilant once daily in the morning for "ulcer".  Avoid anti-inflammatories  Plantar wart - see handout below  Follow up: pending labs   I have included other useful information below for your review.  Insomnia Insomnia is frequent trouble falling and/or staying asleep. Insomnia can be a long term problem or a short term problem. Both are common. Insomnia can be a short term problem when the wakefulness is related to a certain stress or worry. Long term insomnia is often related to ongoing stress during waking hours and/or poor sleeping habits. Overtime, sleep deprivation itself can make the problem worse. Every little thing feels more severe because you are overtired and  your ability to cope is decreased. CAUSES   Stress, anxiety, and depression.  Poor sleeping habits.  Distractions such as TV in the bedroom.  Naps close to bedtime.  Engaging in emotionally charged conversations before bed.  Technical reading before sleep.  Alcohol and other sedatives. They may make the problem worse. They can hurt normal sleep patterns and normal dream activity.  Stimulants such as caffeine for several hours prior to bedtime.  Pain syndromes and shortness of breath can cause insomnia.  Exercise late at night.  Changing time zones may cause sleeping problems (jet lag). It is sometimes helpful to have someone observe your sleeping patterns. They should look for periods of not breathing during the night (sleep apnea). They should also look to see how long those periods last. If you live alone or observers are uncertain, you can also be observed at a sleep clinic where your sleep patterns will be professionally monitored. Sleep apnea requires a checkup and treatment. Give your caregivers your medical history. Give your caregivers observations your family has made about your sleep.  SYMPTOMS   Not feeling rested in the morning.  Anxiety and restlessness at bedtime.  Difficulty falling and staying asleep. TREATMENT   Your caregiver may prescribe treatment for an underlying medical disorders. Your caregiver can give advice or help if you are using alcohol or other drugs for self-medication. Treatment of underlying problems will usually eliminate insomnia problems.  Medications can be prescribed for short time use. They are generally not recommended for lengthy use.  Over-the-counter sleep medicines are not recommended for lengthy use. They can be habit forming.  You can promote easier sleeping by making lifestyle changes such as:  Using relaxation techniques that help with breathing and reduce muscle tension.  Exercising earlier in the day.  Changing your diet  and the time of your last meal. No night time snacks.  Establish a regular time to go to bed.  Counseling can help with stressful problems and worry.  Soothing music and white noise may be helpful if there are background noises you cannot remove.  Stop tedious detailed work at least one hour before bedtime. HOME CARE INSTRUCTIONS   Keep a diary. Inform your caregiver about your progress. This includes any medication side effects. See your caregiver regularly. Take note of:  Times when you are asleep.  Times when you are awake during the night.  The quality of your sleep.  How you feel the next day. This information will help your caregiver care for you.  Get out of bed if you are still awake after 15 minutes. Read or do some quiet activity. Keep the lights down. Wait until you feel sleepy and go back to bed.  Keep regular sleeping and waking hours. Avoid naps.  Exercise regularly.  Avoid distractions at bedtime. Distractions include watching television or engaging in any intense or detailed activity like attempting to balance the household checkbook.  Develop a bedtime ritual. Keep a familiar routine of bathing, brushing your teeth, climbing into bed at the same time each night, listening to soothing music. Routines increase the success of falling to sleep faster.  Use relaxation techniques. This can be using breathing and muscle tension release routines. It can also include visualizing peaceful scenes. You can also help control troubling or intruding thoughts by keeping your mind occupied with boring or repetitive thoughts like the old concept of counting sheep. You can make it more creative like imagining planting one beautiful flower after another in your backyard garden.  During your day, work to eliminate stress. When this is not possible use some of the previous suggestions to help reduce the anxiety that accompanies stressful situations. MAKE SURE YOU:   Understand these  instructions.  Will watch your condition.  Will get help right away if you are not doing well or get worse. Document Released: 10/22/2000 Document Revised: 01/17/2012 Document Reviewed: 11/22/2007 [facility] Patient Information 2014 [place].   Plantar Wart Warts are benign (noncancerous) growths of the outer skin layer. They can occur at any time in life but are most common during childhood and the teen years. Warts can occur on many skin surfaces of the body. When they occur on the underside (sole) of your foot they are called plantar warts. They often emerge in groups with several small warts encircling a larger growth. CAUSES  Human papillomavirus (HPV) is the cause of plantar warts. HPV attacks a break in the skin of the foot. Walking barefoot can lead to exposure to the wart virus. Plantar warts tend to develop over areas of pressure such as the heel and ball of the foot. Plantar warts often grow into the deeper layers of skin. They may spread to other areas of the sole but cannot spread to other areas of the body. SYMPTOMS  You may also notice a growth on the undersurface of your foot. The wart may grow directly into the sole of the foot, or rise above the surface of the skin on the sole of the foot, or both.  They are most often flat from pressure. Warts generally do not cause itching but may cause pain in the area of the wart when you put weight on your foot. DIAGNOSIS  Diagnosis is made by physical examination. This means your caregiver discovers it while examining your foot.  TREATMENT  There are many ways to treat plantar warts. However, warts are very tough. Sometimes it is difficult to treat them so that they go away completely and do not grow back. Any treatment must be done regularly to work. If left untreated, most plantar warts will eventually disappear over a period of one to two years. Treatments you can do at home include:  Putting duct tape over the top of the wart  (occlusion), has been found to be effective over several months. The duct tape should be removed each night and reapplied until the wart has disappeared.  Placing over-the-counter medications on top of the wart to help kill the wart virus and remove the wart tissue (salicylic acid, cantharidin, and dichloroacetic acid ) are useful. These are called keratolytic agents. These medications make the skin soft and gradually layers will shed away. Theses compounds are usually placed on the wart each night and then covered with a band-aid. They are also available in pre-medicated band-aid form. Avoid surrounding skin when applying these liquids as these medications can burn healthy skin. The treatment may take several months of nightly use to be effective.  Cryotherapy to freeze the wart has recently become available over-the-counter for children 4 years and older. This system makes use of a soft narrow applicator connected to a bottle of compressed cold liquid that is applied directly to the wart. This medication can burn health skin and should be used with caution.  As with all over-the-counter medications, read the directions carefully before use. Treatments generally done in your caregiver's office include:  Some aggressive treatments may cause discomfort, discoloration and scaring of the surrounding skin. The risks and benefits of treatment should be discussed with your caregiver.  Freezing the wart with liquid nitrogen (cryotherapy, see above).  Burning the wart with use of very high heat (cautery).  Injecting medication into the wart.  Surgically removing or laser treatment of the wart.  Your caregiver may refer you to a dermatologist for difficult to treat, large sized or large numbers of warts. HOME CARE INSTRUCTIONS   Soak the affected area in warm water. Dry the area completely when you are done. Remove the top layer of softened skin, then apply the chosen topical medication and reapply a  bandage.  Remove the bandage daily and file excess wart tissue (pumice stone works well for this purpose). Repeat the entire process daily or every other day for weeks until the plantar wart disappears.  Several brands of salicylic acid pads are available as over-the-counter remedies.  Pain can be relieved by wearing a doughnut bandage. This is a bandage with a hole in it. The bandage is put on with the hole over the wart. This helps take the pressure off the wart and gives pain relief. To help prevent plantar warts:  Wear shoes and socks and change them daily.  Keep feet clean and dry.  Check your feet and your children's feet regularly.  Avoid direct contact with warts on other people.  Have growths, or changes on your skin checked by your caregiver. Document Released: 01/15/2004 Document Revised: 01/17/2012 Document Reviewed: 06/25/2009 [facility] Patient Information 2014 [place].

## 2014-01-31 ENCOUNTER — Telehealth: Payer: Self-pay | Admitting: Medical

## 2014-01-31 ENCOUNTER — Other Ambulatory Visit: Payer: Self-pay | Admitting: Medical

## 2014-01-31 LAB — VITAMIN D 25 HYDROXY (VIT D DEFICIENCY, FRACTURES): Vit D, 25-Hydroxy: 25 ng/mL — ABNORMAL LOW (ref 30–89)

## 2014-01-31 MED ORDER — VITAMIN D (ERGOCALCIFEROL) 1.25 MG (50000 UNIT) PO CAPS: 50000.0000 [IU] | ORAL_CAPSULE | ORAL | Status: DC

## 2014-01-31 NOTE — Telephone Encounter (Signed)
Labs were fine except Vit D low, so I am going to put on him on prescription Vit D once weekly.  Plan to recheck this in 53mo.  Specific recommendations:  Chronic back pain-increase Cymbalta to 60 mg in the morning, continue 30 mg for the next week, then increase to 60 mg twice daily. Continue daily stretching exercise routine.  Flexeril as needed, but not daily. Lidoderm patches daily. I strongly recommend we consider physical therapy, water aerobics, swimming, or other supervised therapy for exercise for 4 weeks. Insomnia-talk to your psychiatrist about trazodone or Belsomra. See handout I gave on sleep hygiene yesterday.  Bipolar - see psychiatry as usual  Alkaline phsophatate and glucose elevation - additional labs yesterday were normal! HPV vaccine - return in 2 months for HPV #2  Constipation - get 20g of fiber daily in diet, increase water intake  IBS - as needed Imodium OTC  Epigastric pain - begin Dexilant once daily in the morning for "ulcer". Avoid anti-inflammatories  Plantar wart - see handout I gave yesterday  Recheck in 1 month.

## 2014-02-01 NOTE — Addendum Note (Signed)
Addended by: Westlyn Glaza L on: 02/01/2014 10:12 AM   Modules accepted: Orders  

## 2014-02-01 NOTE — Telephone Encounter (Signed)
PATIENTS MOTHER IS AWARE OF SHANE TYSINGER PAC MESSENGE IN DETAILED. CLS

## 2014-02-06 ENCOUNTER — Other Ambulatory Visit: Payer: Self-pay | Admitting: Medical

## 2014-02-06 ENCOUNTER — Telehealth: Payer: Self-pay | Admitting: Medical

## 2014-02-06 MED ORDER — CYCLOBENZAPRINE HCL 10 MG PO TABS: 10.0000 mg | ORAL_TABLET | Freq: Every day | ORAL | Status: DC

## 2014-02-06 NOTE — Telephone Encounter (Signed)
Reminder from recent visit:  we recently increased to 60 mg twice daily Cymbalta, and need to give this more time to work  I did refill Lidoderm patch he can wear daily  I don't think we need to continue muscle relaxers as regularly as he is using them  As discussed last visit, I do think we need to pursue some sort of supervised exercise or therapy through either physical therapy, water aerobics program, or other process  Please call his psychiatrist to see if they feel like he needs any other medication to help from the standpoint of pain/depression/bipolar such as Effexor, Wellbutrin, Topamax, or other?  I refill the Flexeril for now  Let me know about a referral.  One consideration would be integrative therapy. This office uses a multidisciplinary approach to treat pain such as biofeedback, acupuncture, hands on physical therapy and other remedies. This may be a good place as a next step.

## 2014-02-12 NOTE — Telephone Encounter (Signed)
Patients mother is aware. CLS 

## 2014-03-11 ENCOUNTER — Telehealth: Payer: Self-pay | Admitting: Family Medicine

## 2014-03-11 ENCOUNTER — Telehealth: Payer: Self-pay | Admitting: Medical

## 2014-03-11 NOTE — Telephone Encounter (Signed)
Pt's Eczema is flared up, request refill Hydrocortisone cream 2.5%.  Regarding the water aerobics,  He states the chlorine in pools really irritate his eczema & it is all over but especially in his private area.   Also discussed use of Flexeril with psychiatrist Dr. Albertine Patricia and she has no problem with him taking the Flexeril and said especially if it helps his sleep with his chronic insomnia.  She said there was no concern of use with his current meds and stated you are welcome to call her if you have any concerns over him taking it.

## 2014-03-11 NOTE — Telephone Encounter (Signed)
Lets plan a f/u visit on back pain given the recommendations we discussed last visit

## 2014-03-12 ENCOUNTER — Other Ambulatory Visit: Payer: Self-pay | Admitting: Medical

## 2014-03-12 MED ORDER — CYCLOBENZAPRINE HCL 10 MG PO TABS: 10.0000 mg | ORAL_TABLET | Freq: Every day | ORAL | Status: DC

## 2014-03-12 MED ORDER — HYDROCORTISONE 2.5 % EX CREA: TOPICAL_CREAM | Freq: Two times a day (BID) | CUTANEOUS | Status: DC

## 2014-03-12 NOTE — Telephone Encounter (Signed)
meds sent, but plan OV f/u to go over last visit recommendations and plan

## 2014-03-12 NOTE — Telephone Encounter (Signed)
Medical records sent ?

## 2014-03-12 NOTE — Telephone Encounter (Signed)
Patient' s mother is aware of Dorothea Ogle [facility] message. CLS

## 2014-03-14 NOTE — Telephone Encounter (Signed)
Mom aware.

## 2014-04-02 ENCOUNTER — Encounter (HOSPITAL_COMMUNITY): Payer: Self-pay | Admitting: Emergency Medicine

## 2014-04-02 ENCOUNTER — Emergency Department (HOSPITAL_COMMUNITY)
Admission: EM | Admit: 2014-04-02 | Discharge: 2014-04-03 | Disposition: A | Discharge status: Other Acute Inpatient Hospital | Payer: 59 | Attending: Emergency Medicine | Admitting: Emergency Medicine

## 2014-04-02 DIAGNOSIS — IMO0002 Reserved for concepts with insufficient information to code with codable children: Secondary | ICD-10-CM | POA: Insufficient documentation

## 2014-04-02 DIAGNOSIS — K219 Gastro-esophageal reflux disease without esophagitis: Secondary | ICD-10-CM | POA: Insufficient documentation

## 2014-04-02 DIAGNOSIS — F17200 Nicotine dependence, unspecified, uncomplicated: Secondary | ICD-10-CM | POA: Insufficient documentation

## 2014-04-02 DIAGNOSIS — F341 Dysthymic disorder: Secondary | ICD-10-CM

## 2014-04-02 DIAGNOSIS — F411 Generalized anxiety disorder: Secondary | ICD-10-CM | POA: Insufficient documentation

## 2014-04-02 DIAGNOSIS — R109 Unspecified abdominal pain: Secondary | ICD-10-CM | POA: Insufficient documentation

## 2014-04-02 DIAGNOSIS — F319 Bipolar disorder, unspecified: Secondary | ICD-10-CM | POA: Insufficient documentation

## 2014-04-02 DIAGNOSIS — F3289 Other specified depressive episodes: Secondary | ICD-10-CM | POA: Insufficient documentation

## 2014-04-02 DIAGNOSIS — G8929 Other chronic pain: Secondary | ICD-10-CM | POA: Insufficient documentation

## 2014-04-02 DIAGNOSIS — Z79899 Other long term (current) drug therapy: Secondary | ICD-10-CM | POA: Insufficient documentation

## 2014-04-02 DIAGNOSIS — F329 Major depressive disorder, single episode, unspecified: Secondary | ICD-10-CM | POA: Insufficient documentation

## 2014-04-02 LAB — CBC WITH DIFFERENTIAL/PLATELET
BASOS ABS: 0 10*3/uL (ref 0.0–0.1)
Basophils Relative: 0 % (ref 0–1)
EOS PCT: 2 % (ref 0–5)
Eosinophils Absolute: 0.3 10*3/uL (ref 0.0–0.7)
HEMATOCRIT: 44.3 % (ref 39.0–52.0)
Hemoglobin: 15.6 g/dL (ref 13.0–17.0)
LYMPHS ABS: 1.9 10*3/uL (ref 0.7–4.0)
LYMPHS PCT: 12 % (ref 12–46)
MCH: 31.4 pg (ref 26.0–34.0)
MCHC: 35.2 g/dL (ref 30.0–36.0)
MCV: 89.1 fL (ref 78.0–100.0)
Monocytes Absolute: 1.2 10*3/uL — ABNORMAL HIGH (ref 0.1–1.0)
Monocytes Relative: 7 % (ref 3–12)
Neutro Abs: 12.9 10*3/uL — ABNORMAL HIGH (ref 1.7–7.7)
Neutrophils Relative %: 79 % — ABNORMAL HIGH (ref 43–77)
Platelets: 296 10*3/uL (ref 150–400)
RBC: 4.97 MIL/uL (ref 4.22–5.81)
RDW: 12.8 % (ref 11.5–15.5)
WBC: 16.4 10*3/uL — AB (ref 4.0–10.5)

## 2014-04-02 LAB — COMPREHENSIVE METABOLIC PANEL
ALT: 13 U/L (ref 0–53)
AST: 17 U/L (ref 0–37)
Albumin: 5 g/dL (ref 3.5–5.2)
Alkaline Phosphatase: 126 U/L — ABNORMAL HIGH (ref 39–117)
BUN: 12 mg/dL (ref 6–23)
CALCIUM: 10.1 mg/dL (ref 8.4–10.5)
CO2: 26 meq/L (ref 19–32)
Chloride: 99 mEq/L (ref 96–112)
Creatinine, Ser: 0.73 mg/dL (ref 0.50–1.35)
GLUCOSE: 92 mg/dL (ref 70–99)
Potassium: 4 mEq/L (ref 3.7–5.3)
Sodium: 140 mEq/L (ref 137–147)
Total Bilirubin: 0.5 mg/dL (ref 0.3–1.2)
Total Protein: 7.9 g/dL (ref 6.0–8.3)

## 2014-04-02 LAB — ACETAMINOPHEN LEVEL: Acetaminophen (Tylenol), Serum: <15 ug/mL (ref 10–30)

## 2014-04-02 LAB — SALICYLATE LEVEL

## 2014-04-02 LAB — URINALYSIS, ROUTINE W REFLEX MICROSCOPIC
Bilirubin Urine: NEGATIVE
Glucose, UA: NEGATIVE mg/dL
Hgb urine dipstick: NEGATIVE
Ketones, ur: NEGATIVE mg/dL
Leukocytes, UA: NEGATIVE
NITRITE: NEGATIVE
Protein, ur: NEGATIVE mg/dL
SPECIFIC GRAVITY, URINE: 1.022 (ref 1.005–1.030)
UROBILINOGEN UA: 0.2 mg/dL (ref 0.0–1.0)
pH: 6.5 (ref 5.0–8.0)

## 2014-04-02 LAB — ETHANOL: Alcohol, Ethyl (B): <11 mg/dL (ref 0–11)

## 2014-04-02 NOTE — ED Provider Notes (Signed)
CSN: [id number]     Arrival date & time 04/02/14  2141 History  This chart was scribed for non-physician practitioner Montine Circle working with Varney Biles, MD by Mercy Moore, ED Scribe. This patient was seen in room WTR4/WLPT4 and the patient's care was started at 11:36 PM.   No chief complaint on file.    The history is provided by the patient. No language interpreter was used.   HPI Comments: Carl Hughes is a 23 y.o. male who presents to the Emergency Department voluntarily requesting psychiatric evaluation. Patient is complaining of abdominal, onset this afternoon after waking up from a nap. Says he went downstairs to retrieve pain medication and was outwardly irritable. His mother interpreted his behavior as threatening and called the police to have him evaluated. He denies suicidal or homicidal ideation.            Patient denies vomiting and diarrhea, but reports subjective fever.  Patient shares history of ulcers and IBS. States that he recently ate pot roast that contained too much grease. Patient states his current pain is different from his usual pain; says he feels like he ate something bad.   Past Medical History  Diagnosis Date  . Anxiety   . Depression   . GERD (gastroesophageal reflux disease)   . Tobacco use disorder   . Gallbladder polyp   . IBS (irritable bowel syndrome)   . Chronic back pain   . Exercise-induced asthma   . Chronic back pain   . Polyarthralgia   . Bipolar disorder     Dr. Pearson Grippe, [facility]  . Wears glasses    Past Surgical History  Procedure Laterality Date  . Wisdom tooth extraction     Family History  Problem Relation Age of Onset  . Depression Father   . Other Father     chronic back pain  . Arthritis Father   . Gout Father   . Heart disease Maternal Grandmother   . Diabetes Maternal Grandfather   . Cancer Maternal Grandfather     colorectal  . Hypertension Maternal Grandfather   . Hyperlipidemia  Maternal Grandfather   . Heart disease Paternal Grandfather    History  Substance Use Topics  . Smoking status: Current Every Day Smoker -- 0.50 packs/day for 7 years    Types: Cigarettes  . Smokeless tobacco: Never Used  . Alcohol Use: 4.2 oz/week    7 Cans of beer per week    Review of Systems  Constitutional: Negative for fever.  Gastrointestinal: Positive for abdominal pain. Negative for vomiting and diarrhea.  Psychiatric/Behavioral: Negative for suicidal ideas.      Allergies  Zofran; Buspar; Ibuprofen; and Lamictal  Home Medications   Prior to Admission medications   Medication Sig Start Date End Date Taking? Authorizing Provider  acetaminophen (TYLENOL) 500 MG tablet Take 1,000 mg by mouth every 6 (six) hours as needed for headache.   Yes Historical Provider, MD  alprazolam Duanne Moron) 2 MG tablet Take 2 mg by mouth at bedtime as needed for sleep.   Yes Historical Provider, MD  cyclobenzaprine (FLEXERIL) 10 MG tablet Take 1 tablet (10 mg total) by mouth at bedtime. 03/12/14  Yes Camelia Eng Tysinger, PA-C  dexlansoprazole (DEXILANT) 60 MG capsule Take 120 mg by mouth every morning.   Yes Historical Provider, MD  diazepam (VALIUM) 10 MG tablet Take 20 mg by mouth at bedtime as needed for anxiety.   Yes Historical Provider, MD  DULoxetine (CYMBALTA) 60  MG capsule Take 120 mg by mouth every morning.   Yes Historical Provider, MD  hydrocortisone 2.5 % cream Apply 1 application topically 2 (two) times daily as needed (eczema).   Yes Historical Provider, MD  lidocaine (LIDODERM) 5 % Place 1 patch onto the skin daily. Remove & Discard patch within 12 hours or as directed by MD 01/30/14  Yes Camelia Eng Tysinger, PA-C  albuterol (VENTOLIN HFA) 108 (90 BASE) MCG/ACT inhaler Inhale 2 puffs into the lungs every 6 (six) hours as needed for wheezing. 08/23/12   Carlena Hurl, PA-C   Triage Vitals: BP 127/78  Pulse 104  Temp(Src) 97.9 F (36.6 C) (Oral)  Resp 20  SpO2 99% Physical Exam   Nursing note and vitals reviewed. Constitutional: He is oriented to person, place, and time. He appears well-developed and well-nourished. No distress.  HENT:  Head: Normocephalic and atraumatic.  Eyes: EOM are normal.  Neck: Neck supple. No tracheal deviation present.  Cardiovascular: Normal rate.   Pulmonary/Chest: Effort normal. No respiratory distress.  Musculoskeletal: Normal range of motion.  Neurological: He is alert and oriented to person, place, and time.  Skin: Skin is warm and dry.  Psychiatric: He has a normal mood and affect. His behavior is normal.    ED Course  Procedures (including critical care time) DIAGNOSTIC STUDIES: Oxygen Saturation is 99% on room air, normal by my interpretation.    COORDINATION OF CARE: 11:42 PM- Discussed treatment plan with patient at bedside and patient agreed to plan.     Results for orders placed during the hospital encounter of 04/02/14  CBC WITH DIFFERENTIAL      Result Value Ref Range   WBC 16.4 (*) 4.0 - 10.5 K/uL   RBC 4.97  4.22 - 5.81 MIL/uL   Hemoglobin 15.6  13.0 - 17.0 g/dL   HCT 44.3  39.0 - 52.0 %   MCV 89.1  78.0 - 100.0 fL   MCH 31.4  26.0 - 34.0 pg   MCHC 35.2  30.0 - 36.0 g/dL   RDW 12.8  11.5 - 15.5 %   Platelets 296  150 - 400 K/uL   Neutrophils Relative % 79 (*) 43 - 77 %   Neutro Abs 12.9 (*) 1.7 - 7.7 K/uL   Lymphocytes Relative 12  12 - 46 %   Lymphs Abs 1.9  0.7 - 4.0 K/uL   Monocytes Relative 7  3 - 12 %   Monocytes Absolute 1.2 (*) 0.1 - 1.0 K/uL   Eosinophils Relative 2  0 - 5 %   Eosinophils Absolute 0.3  0.0 - 0.7 K/uL   Basophils Relative 0  0 - 1 %   Basophils Absolute 0.0  0.0 - 0.1 K/uL  COMPREHENSIVE METABOLIC PANEL      Result Value Ref Range   Sodium 140  137 - 147 mEq/L   Potassium 4.0  3.7 - 5.3 mEq/L   Chloride 99  96 - 112 mEq/L   CO2 26  19 - 32 mEq/L   Glucose, Bld 92  70 - 99 mg/dL   BUN 12  6 - 23 mg/dL   Creatinine, Ser 0.73  0.50 - 1.35 mg/dL   Calcium 10.1  8.4 - 10.5  mg/dL   Total Protein 7.9  6.0 - 8.3 g/dL   Albumin 5.0  3.5 - 5.2 g/dL   AST 17  0 - 37 U/L   ALT 13  0 - 53 U/L   Alkaline Phosphatase 126 (*) 39 -  117 U/L   Total Bilirubin 0.5  0.3 - 1.2 mg/dL   GFR calc non Af Amer >90  >90 mL/min   GFR calc Af Amer >90  >90 mL/min  URINALYSIS, ROUTINE W REFLEX MICROSCOPIC      Result Value Ref Range   Color, Urine YELLOW  YELLOW   APPearance CLEAR  CLEAR   Specific Gravity, Urine 1.022  1.005 - 1.030   pH 6.5  5.0 - 8.0   Glucose, UA NEGATIVE  NEGATIVE mg/dL   Hgb urine dipstick NEGATIVE  NEGATIVE   Bilirubin Urine NEGATIVE  NEGATIVE   Ketones, ur NEGATIVE  NEGATIVE mg/dL   Protein, ur NEGATIVE  NEGATIVE mg/dL   Urobilinogen, UA 0.2  0.0 - 1.0 mg/dL   Nitrite NEGATIVE  NEGATIVE   Leukocytes, UA NEGATIVE  NEGATIVE  URINE RAPID DRUG SCREEN (HOSP PERFORMED)      Result Value Ref Range   Opiates POSITIVE (*) NONE DETECTED   Cocaine NONE DETECTED  NONE DETECTED   Benzodiazepines POSITIVE (*) NONE DETECTED   Amphetamines NONE DETECTED  NONE DETECTED   Tetrahydrocannabinol POSITIVE (*) NONE DETECTED   Barbiturates NONE DETECTED  NONE DETECTED  ETHANOL      Result Value Ref Range   Alcohol, Ethyl (B) <11  0 - 11 mg/dL  ACETAMINOPHEN LEVEL      Result Value Ref Range   Acetaminophen (Tylenol), Serum <15.0  10 - 30 ug/mL  SALICYLATE LEVEL      Result Value Ref Range   Salicylate Lvl 123456 (*) 2.8 - 20.0 mg/dL   Ct Abdomen Pelvis W Contrast  04/03/2014   CLINICAL DATA:  Upper abdominal pain.  Leukocytosis.  EXAM: CT ABDOMEN AND PELVIS WITH CONTRAST  TECHNIQUE: Multidetector CT imaging of the abdomen and pelvis was performed using the standard protocol following bolus administration of intravenous contrast.  CONTRAST:  100 mL of Omnipaque 300 IV contrast  COMPARISON:  Abdominal ultrasound performed 06/01/2011, and MRI of the lumbar spine performed 04/28/2011  FINDINGS: The visualized lung bases are clear.  The liver and spleen are unremarkable in  appearance. The gallbladder is within normal limits; known gallbladder polyps are not well characterized on CT. The pancreas and adrenal glands are unremarkable.  The kidneys are unremarkable in appearance. There is no evidence of hydronephrosis. No renal or ureteral stones are seen. No perinephric stranding is appreciated.  No free fluid is identified. The small bowel is unremarkable in appearance. The stomach is within normal limits. No acute vascular abnormalities are seen.  The appendix is normal in caliber, without evidence for appendicitis. The colon is unremarkable in appearance.  The bladder is mildly distended and grossly unremarkable. The prostate is incompletely imaged, but remains normal in size. No inguinal lymphadenopathy is seen.  No acute osseous abnormalities are identified.  IMPRESSION: Unremarkable contrast-enhanced CT of the abdomen and pelvis.   Electronically Signed   By: Garald Balding M.D.   On: 04/03/2014 02:48     Imaging Review No results found.   EKG Interpretation None      MDM   Final diagnoses:  None    Patient with reported aggressive behavior. Under IVC by his mother. States that he was just feeling frustrated because of his stomach ache.  States that he is not a danger to self or others.  Medically clear.  Will consult TTS and arrange for first opinion to be made by psych team.  I personally performed the services described in this documentation, which was  scribed in my presence. The recorded information has been reviewed and is accurate.      Montine Circle, PA-C 04/03/14 [phone]

## 2014-04-02 NOTE — ED Notes (Signed)
Patient is alert and oriented x3.  He voluntary came to the ED after his mothers request to police to have him evaluated. Patient states his is having abdominal pain and is states he is irritable.  Currently he rates his stomach pain 5 of 10. He denies any SI of HI.

## 2014-04-03 ENCOUNTER — Emergency Department (HOSPITAL_COMMUNITY): Payer: 59

## 2014-04-03 ENCOUNTER — Encounter (HOSPITAL_COMMUNITY): Payer: Self-pay

## 2014-04-03 ENCOUNTER — Encounter (HOSPITAL_COMMUNITY): Payer: Self-pay | Admitting: General Practice

## 2014-04-03 ENCOUNTER — Inpatient Hospital Stay (HOSPITAL_COMMUNITY)
Admission: AD | Admit: 2014-04-03 | Discharge: 2014-04-09 | DRG: 885 | Disposition: A | Discharge status: Home / Self Care | Payer: 59 | Source: Intra-hospital | Attending: Psychiatry | Admitting: Psychiatry

## 2014-04-03 DIAGNOSIS — IMO0001 Reserved for inherently not codable concepts without codable children: Secondary | ICD-10-CM | POA: Diagnosis present

## 2014-04-03 DIAGNOSIS — Z8349 Family history of other endocrine, nutritional and metabolic diseases: Secondary | ICD-10-CM

## 2014-04-03 DIAGNOSIS — F1210 Cannabis abuse, uncomplicated: Secondary | ICD-10-CM | POA: Diagnosis present

## 2014-04-03 DIAGNOSIS — Z8249 Family history of ischemic heart disease and other diseases of the circulatory system: Secondary | ICD-10-CM

## 2014-04-03 DIAGNOSIS — K5900 Constipation, unspecified: Secondary | ICD-10-CM | POA: Diagnosis present

## 2014-04-03 DIAGNOSIS — G4700 Insomnia, unspecified: Secondary | ICD-10-CM | POA: Diagnosis present

## 2014-04-03 DIAGNOSIS — F319 Bipolar disorder, unspecified: Secondary | ICD-10-CM | POA: Diagnosis present

## 2014-04-03 DIAGNOSIS — F329 Major depressive disorder, single episode, unspecified: Secondary | ICD-10-CM | POA: Diagnosis present

## 2014-04-03 DIAGNOSIS — M549 Dorsalgia, unspecified: Secondary | ICD-10-CM | POA: Diagnosis present

## 2014-04-03 DIAGNOSIS — K589 Irritable bowel syndrome without diarrhea: Secondary | ICD-10-CM | POA: Diagnosis present

## 2014-04-03 DIAGNOSIS — R45851 Suicidal ideations: Secondary | ICD-10-CM

## 2014-04-03 DIAGNOSIS — F411 Generalized anxiety disorder: Secondary | ICD-10-CM | POA: Diagnosis present

## 2014-04-03 DIAGNOSIS — G8929 Other chronic pain: Secondary | ICD-10-CM | POA: Diagnosis present

## 2014-04-03 DIAGNOSIS — Z833 Family history of diabetes mellitus: Secondary | ICD-10-CM

## 2014-04-03 DIAGNOSIS — F17200 Nicotine dependence, unspecified, uncomplicated: Secondary | ICD-10-CM | POA: Diagnosis present

## 2014-04-03 DIAGNOSIS — F332 Major depressive disorder, recurrent severe without psychotic features: Principal | ICD-10-CM | POA: Diagnosis present

## 2014-04-03 DIAGNOSIS — F341 Dysthymic disorder: Secondary | ICD-10-CM

## 2014-04-03 DIAGNOSIS — K219 Gastro-esophageal reflux disease without esophagitis: Secondary | ICD-10-CM | POA: Diagnosis present

## 2014-04-03 LAB — RAPID URINE DRUG SCREEN, HOSP PERFORMED
Amphetamines: NOT DETECTED
Barbiturates: NOT DETECTED
Benzodiazepines: POSITIVE — AB
Cocaine: NOT DETECTED
OPIATES: POSITIVE — AB
Tetrahydrocannabinol: POSITIVE — AB

## 2014-04-03 MED ORDER — PROMETHAZINE HCL 25 MG PO TABS
12.5000 mg | ORAL_TABLET | Freq: Three times a day (TID) | ORAL | Status: DC | PRN
  Administered 2014-04-03 – 2014-04-04 (×2): 12.5 mg via ORAL
  Filled 2014-04-03 (×2): qty 1

## 2014-04-03 MED ORDER — ACETAMINOPHEN 325 MG PO TABS
650.0000 mg | ORAL_TABLET | Freq: Four times a day (QID) | ORAL | Status: DC | PRN
  Administered 2014-04-08: 650 mg via ORAL
  Filled 2014-04-03: qty 2

## 2014-04-03 MED ORDER — TRAZODONE HCL 50 MG PO TABS
50.0000 mg | ORAL_TABLET | Freq: Every evening | ORAL | Status: DC | PRN
  Administered 2014-04-06: 50 mg via ORAL
  Filled 2014-04-03 (×12): qty 1

## 2014-04-03 MED ORDER — DIAZEPAM 5 MG PO TABS
20.0000 mg | ORAL_TABLET | Freq: Every day | ORAL | Status: DC
  Administered 2014-04-03 – 2014-04-04 (×2): 20 mg via ORAL
  Filled 2014-04-03 (×2): qty 4

## 2014-04-03 MED ORDER — DOCUSATE SODIUM 100 MG PO CAPS
100.0000 mg | ORAL_CAPSULE | Freq: Every day | ORAL | Status: DC
  Administered 2014-04-03 – 2014-04-09 (×7): 100 mg via ORAL
  Filled 2014-04-03 (×9): qty 1

## 2014-04-03 MED ORDER — IOHEXOL 300 MG/ML  SOLN
100.0000 mL | Freq: Once | INTRAMUSCULAR | Status: AC | PRN
  Administered 2014-04-03: 100 mL via INTRAVENOUS

## 2014-04-03 MED ORDER — DULOXETINE HCL 60 MG PO CPEP
60.0000 mg | ORAL_CAPSULE | Freq: Once | ORAL | Status: AC
  Administered 2014-04-03: 60 mg via ORAL
  Filled 2014-04-03 (×2): qty 1

## 2014-04-03 MED ORDER — ALBUTEROL SULFATE HFA 108 (90 BASE) MCG/ACT IN AERS: 2.0000 | INHALATION_SPRAY | Freq: Four times a day (QID) | RESPIRATORY_TRACT | Status: DC | PRN

## 2014-04-03 MED ORDER — NICOTINE POLACRILEX 2 MG MT GUM
2.0000 mg | CHEWING_GUM | OROMUCOSAL | Status: DC | PRN
  Administered 2014-04-04 – 2014-04-09 (×8): 2 mg via ORAL
  Filled 2014-04-03 (×3): qty 1

## 2014-04-03 MED ORDER — DIAZEPAM 5 MG PO TABS
5.0000 mg | ORAL_TABLET | Freq: Every evening | ORAL | Status: DC | PRN
  Administered 2014-04-03: 5 mg via ORAL
  Filled 2014-04-03: qty 1

## 2014-04-03 MED ORDER — ACETAMINOPHEN 500 MG PO TABS: 1000.0000 mg | ORAL_TABLET | Freq: Four times a day (QID) | ORAL | Status: DC | PRN

## 2014-04-03 MED ORDER — IOHEXOL 300 MG/ML  SOLN
50.0000 mL | Freq: Once | INTRAMUSCULAR | Status: AC | PRN
  Administered 2014-04-03: 50 mL via ORAL

## 2014-04-03 MED ORDER — DULOXETINE HCL 60 MG PO CPEP: 120.0000 mg | ORAL_CAPSULE | ORAL | Status: DC

## 2014-04-03 MED ORDER — ACETAMINOPHEN 500 MG PO TABS
1000.0000 mg | ORAL_TABLET | Freq: Four times a day (QID) | ORAL | Status: DC | PRN
  Administered 2014-04-04: 1000 mg via ORAL
  Filled 2014-04-03: qty 2

## 2014-04-03 MED ORDER — ALUM & MAG HYDROXIDE-SIMETH 200-200-20 MG/5ML PO SUSP: 30.0000 mL | ORAL | Status: DC | PRN

## 2014-04-03 MED ORDER — PANTOPRAZOLE SODIUM 40 MG PO TBEC
80.0000 mg | DELAYED_RELEASE_TABLET | Freq: Every day | ORAL | Status: DC
  Administered 2014-04-04 – 2014-04-09 (×6): 80 mg via ORAL
  Filled 2014-04-03 (×8): qty 2

## 2014-04-03 MED ORDER — CYCLOBENZAPRINE HCL 10 MG PO TABS
10.0000 mg | ORAL_TABLET | Freq: Every day | ORAL | Status: DC
  Administered 2014-04-03 – 2014-04-08 (×6): 10 mg via ORAL
  Filled 2014-04-03 (×4): qty 1
  Filled 2014-04-03 (×2): qty 2
  Filled 2014-04-03 (×4): qty 1

## 2014-04-03 MED ORDER — MAGNESIUM HYDROXIDE 400 MG/5ML PO SUSP: 30.0000 mL | Freq: Every day | ORAL | Status: DC | PRN

## 2014-04-03 MED ORDER — DULOXETINE HCL 60 MG PO CPEP
60.0000 mg | ORAL_CAPSULE | Freq: Every day | ORAL | Status: DC
  Administered 2014-04-03: 60 mg via ORAL
  Filled 2014-04-03 (×2): qty 1

## 2014-04-03 MED ORDER — ALPRAZOLAM 1 MG PO TABS
1.0000 mg | ORAL_TABLET | Freq: Every evening | ORAL | Status: DC | PRN
  Administered 2014-04-03: 1 mg via ORAL
  Filled 2014-04-03: qty 2

## 2014-04-03 MED ORDER — DULOXETINE HCL 60 MG PO CPEP
120.0000 mg | ORAL_CAPSULE | Freq: Every day | ORAL | Status: DC
  Administered 2014-04-04 – 2014-04-09 (×6): 120 mg via ORAL
  Filled 2014-04-03 (×8): qty 2
  Filled 2014-04-03: qty 28

## 2014-04-03 MED ORDER — NICOTINE 21 MG/24HR TD PT24
21.0000 mg | MEDICATED_PATCH | Freq: Every day | TRANSDERMAL | Status: DC
  Administered 2014-04-03: 21 mg via TRANSDERMAL
  Filled 2014-04-03 (×3): qty 1
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
## 2014-04-03 NOTE — ED Notes (Signed)
Pt at nurses station demanding med dosages and time frequency to be changed.  MD made aware and will keep pt med dosage and frequency the same

## 2014-04-03 NOTE — Progress Notes (Signed)
CSW called patient mother to obtain further collateral information. Pt mother states that daily patient begs pt mother to kill himself, and talks about killing himself every day. Pt mother states she has been afraid to get help for patient, as he threatens to kill himself if she ever made him go get help. Pt mother states that since pt threatened to kill pt daughters who are 38 and 35 and were in their faces yesterday she decided to involuntarily commit patient. Patient mother states that pt has lost 80 lbs over the past year, fears that water is poisoned and only wants a specific type of bottle water, and refuses to eat chicken.    Noreene Larsson [phone]  ED CSW 04/03/2014 1125am

## 2014-04-03 NOTE — Tx Team (Signed)
Initial Interdisciplinary Treatment Plan  PATIENT STRENGTHS: (choose at least two) Average or above average intelligence General fund of knowledge  PATIENT STRESSORS: Health problems Marital or family conflict   PROBLEM LIST: Problem List/Patient Goals Date to be addressed Date deferred Reason deferred Estimated date of resolution  Risk for Suicide 04/03/2014           Depression 04/03/2014                                          DISCHARGE CRITERIA:  Improved stabilization in mood, thinking, and/or behavior Reduction of life-threatening or endangering symptoms to within safe limits  PRELIMINARY DISCHARGE PLAN: Attend PHP/IOP Outpatient therapy  PATIENT/FAMIILY INVOLVEMENT: This treatment plan has been presented to and reviewed with the patient, Carl Hughes.  The patient and family have been given the opportunity to ask questions and make suggestions.  Franko Hilliker E Jamier Urbas 04/03/2014, 2:15 PM

## 2014-04-03 NOTE — ED Provider Notes (Signed)
Medical screening examination/treatment/procedure(s) were performed by non-physician practitioner and as supervising physician I was immediately available for consultation/collaboration.   EKG Interpretation None       Shawntay Prest, MD 04/03/14 0928 

## 2014-04-03 NOTE — Progress Notes (Signed)
Patient ID: Carl Hughes, male   DOB: Oct 12, 1991, 23 y.o.   MRN: [id number]  Carl Hughes is a 23 year old male admitted IVC to the unit. Mother IVC'd patient she reports to threats to kill himself and increased agitation. Patient reports that he was just upset due to his stomach hurting and his mother not giving him his medications. Patient reports that his mother keeps all of his medications and he does not have access. Patient is preoccupied with his medications and has requested Phenergan twice since he arrived at [facility] an hour ago. Patient reports that he was not suicidal he was just upset about his mother not giving him his medications and the younger siblings "eavesdropping." Patient currently denies SI/HI and A/V hallucinations. Patient reports pain in back due to chronic condition but reports that the pain level he is at now is his normal range. Patient's past medical history includes IBS, depression, bipolar disorder, GERD, asthma etc. Patient was admitted to the unit with no signs of distress. Patient oriented to the unit and verbalized understanding of the admission process. Q15 minute safety checks were initiated and are maintained. Patient is safe at this time.

## 2014-04-03 NOTE — ED Notes (Addendum)
Belongings put in locker 29

## 2014-04-03 NOTE — Progress Notes (Signed)
Pt accepted to [facility] 508-1, by Dr. Lovena Le, patient to be transported under IVC by [facility].   Noreene Larsson [phone]  ED CSW 04/03/2014 1143

## 2014-04-03 NOTE — Progress Notes (Signed)
D: Patient in the hallway on first approach.  Patient appears flat and depressed.  Patient constantly talks about his medications.  Patient states at home he take valium, xanax, phenergan and flexeril at night and states he is concerned because all of it is not ordered currently.  Patient was resting and states he would like for writer to wake him up when he can have his Phenergan.  Writer explained to patient tht he would have to ask for if the symptom was occuring.    Patient denies SI/HI and denies AVH. A: Staff to monitor Q 15 mins for safety.  Encouragement and support offered.  Scheduled medications administered per orders. R: Patient remains safe on the unit.  Patient attended group tonight.  Patient visible on the unit and interacting with peers.  Patien taking administered medications.

## 2014-04-03 NOTE — ED Notes (Signed)
Will reassess vitals when Pt returns from CT.

## 2014-04-03 NOTE — ED Notes (Signed)
Pt.aaox3. Pt denies SI/HI/AVH. Pt denies pain. Will continue to monitor.

## 2014-04-03 NOTE — ED Notes (Signed)
Patient denies SI, HI, AVH. Contracts for safety on the unit. States that he woke up mad because he was feeling sick. Reports being unsure of what he said or did saying he woke up "fuzzy". States "my mom takes it like I'm going to hurt somebody". Says he came in voluntarily to calm his mom and while here she took out papers on him. Says that "the last she put me in a place like this she went to the beach for a week. Patient is concerned that his prescriptions will be changed because he is here. Patient does not feel that his prescriptions need any adjustment.  Patient reports having recent constipation. Patient reports having falls, most recent approximately one month ago in which "I got dizzy and my knees buckled".  Patient is calm and cooperative.  Encouragement offered. Fall safety reviewed. Environment adjusted.   Patient safety maintained, Q 15 checks in place on the unit.
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
## 2014-04-03 NOTE — ED Notes (Signed)
Pt transferred to rm 11 at this time.

## 2014-04-04 DIAGNOSIS — F329 Major depressive disorder, single episode, unspecified: Secondary | ICD-10-CM

## 2014-04-04 DIAGNOSIS — R45851 Suicidal ideations: Secondary | ICD-10-CM

## 2014-04-04 DIAGNOSIS — F332 Major depressive disorder, recurrent severe without psychotic features: Secondary | ICD-10-CM | POA: Diagnosis present

## 2014-04-04 DIAGNOSIS — F1210 Cannabis abuse, uncomplicated: Secondary | ICD-10-CM

## 2014-04-04 HISTORY — DX: Major depressive disorder, single episode, unspecified: F32.9

## 2014-04-04 MED ORDER — LIDOCAINE 5 % EX PTCH
1.0000 | MEDICATED_PATCH | Freq: Every day | CUTANEOUS | Status: DC
  Administered 2014-04-04 – 2014-04-09 (×6): 1 via TRANSDERMAL
  Filled 2014-04-04 (×8): qty 1

## 2014-04-04 MED ORDER — OLANZAPINE 5 MG PO TBDP
5.0000 mg | ORAL_TABLET | Freq: Every day | ORAL | Status: DC
  Administered 2014-04-04: 5 mg via ORAL
  Filled 2014-04-04 (×3): qty 1

## 2014-04-04 NOTE — Progress Notes (Signed)
Adult Psychoeducational Group Note  Date:  04/04/2014 Time:  10:00am Group Topic/Focus:  Making Healthy Choices:   The focus of this group is to help patients identify negative/unhealthy choices they were using prior to admission and identify positive/healthier coping strategies to replace them upon discharge.  Participation Level:    Participation Quality:    Affect:    Cognitive:    Insight:  Engagement in Group:    Modes of Intervention:   Additional Comments:  pt did not attend group  Gailen Shelter 04/04/2014, 2:13 PM

## 2014-04-04 NOTE — Progress Notes (Signed)
D: Pt denies SI/HI/AVH. Pt is pleasant and cooperative. Pt stated he does not want to go back to live with his mother. Pt concerned where he will go when he leaves [facility].  A: Pt was offered support and encouragement. Pt was given scheduled medications. Pt was encourage to attend groups. Q 15 minute checks were done for safety.   R:Pt attends groups and interacts well with peers and staff. Pt is taking medication. Pt has no complaints at this time.Pt receptive to treatment and safety maintained on unit.
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
## 2014-04-04 NOTE — BHH Group Notes (Signed)
0900 nursing orientation group   The focus of this group is to educate the patient on the purpose and policies of crisis stabilization and provide a format to answer questions about their admission.  The group details unit policies and expectations of patients while admitted.   Pt did not attend.  Pt stated,"I am in so much pain I need to lay down"

## 2014-04-04 NOTE — BHH Group Notes (Signed)
[place] LCSW Group Therapy  04/04/2014 1:15 PM   Type of Therapy:  Group Therapy  Participation Level:  Did Not Attend - pt sleeping in his room  Regan Lemming, [place] 04/04/2014 2:37 PM

## 2014-04-04 NOTE — Progress Notes (Signed)
Pt has been in bed most of the day.  He has not attended any groups today.  He rated his depression a 4 hopelessness 2 and anxiety a 5 on his self-inventory. He denies any S/H ideation or A//V/H.  He requested tylenol for pain at 1121 and Dr. Louretta Shorten wrote for a lidocaine patch which was applied at 1138.  Pt has been resting with eyes closed since around 1300 today.  His mother did call today to find out plans for his discharge.  Called [place] and gave her the message and she was planning to call his mother around noon today. He has been up for meals today.

## 2014-04-04 NOTE — Progress Notes (Signed)
Adult Psychoeducational Group Note  Date:  04/04/2014 Time:  12:16 AM  Group Topic/Focus:  Wrap-Up Group:   The focus of this group is to help patients review their daily goal of treatment and discuss progress on daily workbooks.  Participation Level:  Active  Participation Quality:  Appropriate  Affect:  Appropriate  Cognitive:  Appropriate  Insight: Appropriate  Engagement in Group:  Engaged  Modes of Intervention:  Discussion  Additional Comments:  The patient expressed that he had a difficult day adjusting and figuring out his situation.The patient recently arrived earlier today.  Carl Hughes 04/04/2014, 12:16 AM

## 2014-04-05 DIAGNOSIS — F1910 Other psychoactive substance abuse, uncomplicated: Secondary | ICD-10-CM

## 2014-04-05 MED ORDER — DIAZEPAM 5 MG PO TABS: 15.0000 mg | ORAL_TABLET | Freq: Every day | ORAL | Status: DC

## 2014-04-05 MED ORDER — DIAZEPAM 5 MG PO TABS
15.0000 mg | ORAL_TABLET | Freq: Every day | ORAL | Status: AC
  Administered 2014-04-05: 15 mg via ORAL
  Filled 2014-04-05: qty 3

## 2014-04-05 MED ORDER — GLYCERIN (LAXATIVE) 2.1 G RE SUPP
1.0000 | Freq: Once | RECTAL | Status: AC
  Administered 2014-04-05: 1 via RECTAL
  Filled 2014-04-05: qty 1

## 2014-04-05 MED ORDER — DIAZEPAM 5 MG PO TABS
5.0000 mg | ORAL_TABLET | Freq: Every day | ORAL | Status: AC
  Administered 2014-04-07: 5 mg via ORAL
  Filled 2014-04-05: qty 1

## 2014-04-05 MED ORDER — DIAZEPAM 5 MG PO TABS
10.0000 mg | ORAL_TABLET | Freq: Every day | ORAL | Status: AC
  Administered 2014-04-06: 10 mg via ORAL
  Filled 2014-04-05: qty 2

## 2014-04-05 MED ORDER — DIAZEPAM 5 MG PO TABS
2.5000 mg | ORAL_TABLET | Freq: Every day | ORAL | Status: DC
  Administered 2014-04-08: 2.5 mg via ORAL
  Filled 2014-04-05: qty 1

## 2014-04-05 MED ORDER — OLANZAPINE 10 MG PO TBDP
10.0000 mg | ORAL_TABLET | Freq: Every day | ORAL | Status: DC
  Administered 2014-04-05 – 2014-04-08 (×4): 10 mg via ORAL
  Filled 2014-04-05: qty 14
  Filled 2014-04-05 (×5): qty 1

## 2014-04-05 NOTE — Progress Notes (Signed)
[facility] MD Progress Note  04/05/2014 12:25 PM Carl Hughes  MRN:  [id number] Subjective: Patient complains somatic pain of abdomen and a back and constipation. Patient reportedly taken prune juice from the staff RN and then requested glycerin suppository because of constipation two days. The patient continued to report depression, anxiety, and disturbance of sleep and poor appetite. Patient Does Not given consent to talk to his mother so case manager was not able to communicate with mother. Reportedly patient mother wants him to go to a long-term facility and patient primary psychiatrist reported patient has been bullying/demanding more medication from the mother and physician. Patient is also known for tetrahydrocannabinol abuse. Patient is in agreement with decreasing/tapering his benzodiazepine 15 mg daily and 10 mg tomorrow and 5 mg after that. We'll reduce his benzodiazepine to 15 mg today and if he can tolerate Will continue with the plan.  Diagnosis:   DSM5: Schizophrenia Disorders:   Obsessive-Compulsive Disorders:   Trauma-Stressor Disorders:   Substance/Addictive Disorders:  Cannabis Use Disorder - Severe (304.30) Depressive Disorders:  Major Depressive Disorder - Severe (296.23) Total Time spent with patient: 30 minutes  Axis I: Major Depression, Recurrent severe and Substance Abuse  ADL's:  Impaired  Sleep: Fair  Appetite:  Fair  Suicidal Ideation:  Patient endorses suicidal ideation contract for safety while in the hospital Homicidal Ideation:  Denied AEB (as evidenced by):  Psychiatric Specialty Exam: Physical Exam  ROS  Blood pressure 116/81, pulse 93, temperature 98.2 F (36.8 C), temperature source Oral, resp. rate 16, height 6\' 2"  (1.88 m), weight 84.823 kg (187 lb).Body mass index is 24 kg/(m^2).  General Appearance: Disheveled and Guarded  Engineer, water::  Fair  Speech:  Clear and Coherent and Slow  Volume:  Decreased  Mood:  Anxious and Depressed  Affect:   Appropriate and Congruent  Thought Process:  Coherent and Goal Directed  Orientation:  Full (Time, Place, and Person)  Thought Content:  WDL  Suicidal Thoughts:  Yes.  without intent/plan  Homicidal Thoughts:  No  Memory:  Immediate;   Fair  Judgement:  Impaired  Insight:  Fair  Psychomotor Activity:  Decreased  Concentration:  Good  Recall:  Good  Fund of Knowledge:Fair  Language: Good  Akathisia:  NA  Handed:  Right  AIMS (if indicated):     Assets:  Communication Skills Desire for Improvement Financial Resources/Insurance Housing Leisure Time Resilience Social Support Talents/Skills Transportation  Sleep:  Number of Hours: 7   Musculoskeletal: Strength & Muscle Tone: within normal limits Gait & Station: normal Patient leans: N/A  Current Medications: Current Facility-Administered Medications  Medication Dose Route Frequency Provider Last Rate Last Dose  . acetaminophen (TYLENOL) tablet 650 mg  650 mg Oral Q6H PRN Clarene Reamer, MD      . albuterol (PROVENTIL HFA;VENTOLIN HFA) 108 (90 BASE) MCG/ACT inhaler 2 puff  2 puff Inhalation Q6H PRN Clarene Reamer, MD      . alum & mag hydroxide-simeth (MAALOX/MYLANTA) 200-200-20 MG/5ML suspension 30 mL  30 mL Oral Q4H PRN Clarene Reamer, MD      . cyclobenzaprine (FLEXERIL) tablet 10 mg  10 mg Oral QHS Laverle Hobby, PA-C   10 mg at 04/04/14 2235  . diazepam (VALIUM) tablet 15 mg  15 mg Oral QHS Durward Parcel, MD      . docusate sodium (COLACE) capsule 100 mg  100 mg Oral Daily Durward Parcel, MD   100 mg at 04/05/14 0820  . DULoxetine (  CYMBALTA) DR capsule 120 mg  120 mg Oral Daily Durward Parcel, MD   120 mg at 04/05/14 0820  . Glycerin (Adult) 2.1 G suppository 1 suppository  1 suppository Rectal Once Nena Polio, PA-C      . lidocaine (LIDODERM) 5 % 1 patch  1 patch Transdermal Daily Durward Parcel, MD   1 patch at 04/05/14 [phone]  . magnesium hydroxide (MILK OF MAGNESIA)  suspension 30 mL  30 mL Oral Daily PRN Clarene Reamer, MD      . nicotine polacrilex (NICORETTE) gum 2 mg  2 mg Oral PRN Nena Polio, PA-C   2 mg at 04/04/14 0936  . OLANZapine zydis (ZYPREXA) disintegrating tablet 10 mg  10 mg Oral QHS Durward Parcel, MD      . pantoprazole (PROTONIX) EC tablet 80 mg  80 mg Oral Daily Laverle Hobby, PA-C   80 mg at 04/05/14 0820  . promethazine (PHENERGAN) tablet 12.5 mg  12.5 mg Oral Q8H PRN Durward Parcel, MD   12.5 mg at 04/04/14 0429  . traZODone (DESYREL) tablet 50 mg  50 mg Oral QHS,MR X 1 Laverle Hobby, PA-C        Lab Results: No results found for this or any previous visit (from the past 48 hour(s)).  Physical Findings: AIMS: Facial and Oral Movements Muscles of Facial Expression: None, normal Lips and Perioral Area: None, normal Jaw: None, normal Tongue: None, normal,Extremity Movements Upper (arms, wrists, hands, fingers): None, normal Lower (legs, knees, ankles, toes): None, normal, Trunk Movements Neck, shoulders, hips: None, normal, Overall Severity Severity of abnormal movements (highest score from questions above): None, normal Incapacitation due to abnormal movements: None, normal Patient's awareness of abnormal movements (rate only patient's report): No Awareness, Dental Status Current problems with teeth and/or dentures?: No Does patient usually wear dentures?: No  CIWA:    COWS:  COWS Total Score: 5  Treatment Plan Summary: Daily contact with patient to assess and evaluate symptoms and progress in treatment Medication management  Plan: Taper off Valium as planned, today 15 mg  Will ask provider tomorrow decrease to 10 mg and Sunday 5 mg Increased Zyprexa 10 mg for mood, insomnia and poor appetite and lost weight Continue rest of his medication for IBS and pain  Medical Decision Making Problem Points:  Established problem, worsening (2), New problem, with no additional work-up planned (3), Review  of last therapy session (1) and Review of psycho-social stressors (1) Data Points:  Review or order clinical lab tests (1) Review or order medicine tests (1) Review of medication regiment & side effects (2) Review of new medications or change in dosage (2)  I certify that inpatient services furnished can reasonably be expected to improve the patient's condition.   Parke Simmers Orin Eberwein 04/05/2014, 12:25 PM

## 2014-04-05 NOTE — BHH Group Notes (Signed)
[facility] LCSW Aftercare Discharge Planning Group Note   04/05/2014 9:57 AM    Participation Quality:  Appropraite  Mood/Affect:  Appropriate  Depression Rating:  3  Anxiety Rating:  5  Thoughts of Suicide:  No  Will you contract for safety?   NA  Current AVH:  No  Plan for Discharge/Comments:  Patient attended discharge planning group and actively participated in group.  He will return to his mother's home and follow up with [facility].  CSW provided all participants with daily workbook.   Transportation Means: Patient has transportation.   Supports:  Patient has a support system.   Trigg Delarocha, [place]

## 2014-04-05 NOTE — Progress Notes (Signed)
D) Pt rates his depressin at a 5 and his hopelessness at a 7. Denies SI and HI. Has been attending the groups and interacting with his peers. Affect is flat and mood remains depressed. Pt 's main focus has been on his bowels today. Has a history of IBS and states he has not had a bowel movement in several days. "I feel very uncomfortable".  A) Was given his glycerin suppository and came to this writer several times to ask when it was going to work. Given support and reassurance along with praise. R) Denies SI and HI. Did have a small bowel movement.

## 2014-04-05 NOTE — Progress Notes (Signed)
Attended group 

## 2014-04-05 NOTE — Tx Team (Signed)
Interdisciplinary Treatment Plan Update   Date Reviewed:  04/05/2014  Time Reviewed:  8:45 AM  Progress in Treatment:   Attending groups: Yes Participating in groups: Yes Taking medication as prescribed: Yes  Tolerating medication: Yes Family/Significant other contact made:  Yes, collateral contact with mother. Patient understands diagnosis: Yes  Discussing patient identified problems/goals with staff: Yes Medical problems stabilized or resolved: Yes Denies suicidal/homicidal ideation: Yes Patient has not harmed self or others: Yes  For review of initial/current patient goals, please see plan of care.  Estimated Length of Stay:  3-4 days  Reasons for Continued Hospitalization:  Anxiety Depression Medication stabilization   New Problems/Goals identified:    Discharge Plan or Barriers:   Home with outpatient follow up with [facility]  Additional Comments:  Patient is seen face-to-face for psychiatric evaluation and reviewed available medical records. Patient reported he has been suffering with depression, anxiety and chronic back pain over 5 years. Patient was brought in to the emergency department by his mother for her uncontrollable symptoms of anger, cursing, yelling and threatening to harm himself. Patient mother does not feel safe with him at home so she made a involuntary commitment petition. Patient has at least 3 previous acute psychiatric hospitalization for depression and suicidal ideations. Reportedly patient was admitted once in [facility] and 2 times at [facility]. Patient stated his stomach has been hurting because of his IBS as well as fibromyalgia and then tried to reach his mother to get medication and meanwhile his 2 young sisters are ear dropping which he believes was admitted rude and become angry and had rage. Reportedly patient mother informed in the emergency department.patient has been making daily suicidal threats  and yesterday was threatening to hurt his sisters.    Attendees:  Patient:  04/05/2014 8:45 AM   Signature: Mylinda Latina, MD 04/05/2014 8:45 AM  Signature:  Nena Polio, PA 04/05/2014 8:45 AM  Signature:   Briscoe Deutscher, RN 04/05/2014 8:45 AM  Signature:Beverly Danelle Earthly, RN 04/05/2014 8:45 AM  Signature:  Drake Leach, RN 04/05/2014 8:45 AM  Signature:  Joette Catching, LCSW 04/05/2014 8:45 AM  Signature:  Regan Lemming, LCSW 04/05/2014 8:45 AM  Signature:  Lucinda Dell, Care Coordinator [facility] 04/05/2014 8:45 AM  Signature:   04/05/2014 8:45 AM  Signature:  04/05/2014  8:45 AM  Signature:   Lars Pinks, RN [facility] 04/05/2014  8:45 AM  Signature: 04/05/2014  8:45 AM    Scribe for Treatment Team:   Joette Catching,  04/05/2014 8:45 AM

## 2014-04-05 NOTE — Progress Notes (Signed)
NUTRITION ASSESSMENT  Pt identified as at risk on the Malnutrition Screen Tool  INTERVENTION: Educated patient on the importance of nutrition and encouraged intake of food and beverages.  NUTRITION DIAGNOSIS: Unintentional weight loss related to decreased in excessive snacking as evidenced by pt report.   Goal: Pt to meet >/= 90% of their estimated nutrition needs.  Monitor:  PO intake  Assessment:  Patient reported he has been suffering with depression, anxiety and chronic back pain over 5 years. Patient was brought in to the emergency department by his mother for her uncontrollable symptoms of anger, cursing, yelling and threatening to harm himself.   Pt reports he had stomach pain for 3 days PTA but was still able to eat. Before then he states he was eating well, 3 meals/day with good appetite. Reports he weighed 200 pounds 6 months ago, currently weighs 187 pounds, and states the weight loss was due to changes in his medications that caused him to not have the munchies anymore. States his appetite is good, denies needing/wanting any nutritional supplements.   23 y.o. male  Height: Ht Readings from Last 1 Encounters:  04/03/14 6\' 2"  (1.88 m)    Weight: Wt Readings from Last 1 Encounters:  04/03/14 187 lb (84.823 kg)    Weight Hx: Wt Readings from Last 10 Encounters:  04/03/14 187 lb (84.823 kg)  01/30/14 208 lb (94.348 kg)  10/02/13 216 lb (97.977 kg)  03/07/13 229 lb (103.874 kg)  10/18/12 272 lb (123.378 kg)  08/23/12 263 lb (119.296 kg)  08/11/12 258 lb (117.028 kg)  08/02/12 267 lb (121.11 kg)  04/19/12 260 lb (117.935 kg) (99%*, Z = 2.55)  10/12/11 242 lb (109.77 kg) (99%*, Z = 2.29)   * Growth percentiles are based on CDC 2-20 Years data.    BMI:  Body mass index is 24 kg/(m^2). Pt meets criteria for normal weight based on current BMI.  Estimated Nutritional Needs: Kcal: 25-30 kcal/kg Protein: > 1 gram protein/kg Fluid: 1 ml/kcal  Diet Order:  General Pt is also offered choice of unit snacks mid-morning and mid-afternoon.  Pt is eating as desired.   Lab results and medications reviewed.   Mikey College MS, [place], [place] Pager [phone] After Hours Pager

## 2014-04-05 NOTE — Progress Notes (Signed)
D: Pt denies SI/HI/AVH. Pt is pleasant and cooperative. Pt stated he had a good night sleep last night, so will try the same thing and leave out the trazodone , unless needed.   A: Pt was offered support and encouragement. Pt was given scheduled medications. Pt was encourage to attend groups. Q 15 minute checks were done for safety.   R:Pt attends groups and interacts well with peers and staff. Pt is taking medication. Pt has no complaints at this time.Pt receptive to treatment and safety maintained on unit.

## 2014-04-05 NOTE — BHH Group Notes (Signed)
[place] LCSW Group Therapy  Feelings Around Relapse 1:15 -2:30        04/05/2014   Type of Therapy:  Group Therapy  Participation Level:  Minimal  Participation Quality:  Appropriate  Affect:  Appropriate  Cognitive:  Attentive Appropriate  Insight:  Developing/Improving  Engagement in Therapy: Developing/Improving  Modes of Intervention:  Discussion Exploration Problem-Solving Supportive  Summary of Progress/Problems:  The topic for today was feelings around relapse.    Patient listened attentive but did not participate.  Concha Pyo 04/05/2014

## 2014-04-06 NOTE — Progress Notes (Signed)
Psychoeducational Group Note  Date: 04/06/2014 Time:  1015  Group Topic/Focus:  Identifying Needs:   The focus of this group is to help patients identify their personal needs that have been historically problematic and identify healthy behaviors to address their needs.  Participation Level:  Active  Participation Quality:  Appropriate  Affect:  Appropriate  Cognitive:  Oriented  Insight:  Improving  Engagement in Group:  Engaged  Additional Comments:  Pt was attentive and involved in the group.  Carl Hughes

## 2014-04-06 NOTE — BHH Group Notes (Signed)
[place] Group Notes: (Clinical Social Work)   04/06/2014 1:15-2:15PM   Summary of Progress/Problems: The main focus of today's process group was for the patient to identify ways in which they have sabotaged their own mental health wellness/recovery. Motivational interviewing and a handout were used to explore the benefits and costs of their self-sabotaging behavior as well as the benefits and costs of changing this behavior. The Stages of Change were explained to the group using a handout, and patients identified where they are with regard to changing self-defeating behaviors. The patient expressed he self-sabotages with isolation. He feels that this is not something that he chooses, but rather it is imposed on him by others not knowing how to be around him, because he is socially unable to interact. He feels he is in the Preparation Stage, and states that he needs to stay on his medications and stay active physically for his Fibromyalgia.   Type of Therapy: Process Group   Participation Level: Active   Participation Quality: Attentive and Sharing   Affect: Defensive and Depressed   Cognitive: Oriented   Insight: Developing/Improving   Engagement in Therapy: Engaged   Modes of Intervention: Education, Motivational Interviewing   [employer], LCSW  04/06/2014, 4:00pm

## 2014-04-06 NOTE — Progress Notes (Signed)
D: Pt mood is depressed. Affect is flat. He is attending groups and interacts with his peers. He has been appropriate and cooperative. A: Support given. Verbalization encouraged. Medications given as prescribed. Pt encouraged to come to nurse with any concerns.  R: Pt is receptive. No complaints of pain or discomfort at this time. Will continue to monitor pt. Q15 min safety checks maintained.

## 2014-04-06 NOTE — Progress Notes (Signed)
D) Pt has been attending the groups, partisipates and interacts with his peers. Affect remains flat and mood depressed. Pt continues to focus on his physical concerns about not having a complete BM yesterday. Affect is flat and mood depressed. Pt rates his depression and hopelessness both at a 3 and denies SI and HI. A) Given support, reassurance and praise. Encouragement given. Provided Pt with a 1:1. R)Denies SI and HI.  Attending the groups and interacting with his peers.

## 2014-04-06 NOTE — Progress Notes (Signed)
[facility] MD Progress Note  04/06/2014 2:27 PM Carl Hughes  MRN:  [id number] Subjective:  Patient was involuntarily committed by his mother for suicidal ideations.  Patient reported doing well today.  He reported sleeping well and eating well.  Patient stated" now I can focus better, move better and think better"  He denied S//HI/AVH.  Patient asked this writer to keep him on the same medication when he is discharged  Home.  Patient plans to go get a job as soon as he is stabilized and discharged.  He complained about his Fibromyalgia and reported using Mariajuana to treat his pain. This Probation officer advised patient that combining Marijuana and prescribed medication will not help his brain function.  Patient plans not to use Marijuana  to treat his pain.  DSM5:  Total Time spent with patient: 20 minutes  Axis I: Major Depression, Recurrent severe Axis II: Deferred Axis III:  Past Medical History  Diagnosis Date  . Anxiety   . Depression   . GERD (gastroesophageal reflux disease)   . Tobacco use disorder   . Gallbladder polyp   . IBS (irritable bowel syndrome)   . Chronic back pain   . Exercise-induced asthma   . Chronic back pain   . Polyarthralgia   . Bipolar disorder     Dr. Pearson Grippe, [facility]  . Wears glasses    Axis IV: occupational problems, other psychosocial or environmental problems and problems related to social environment Axis V: 41-50 serious symptoms  ADL's:  Intact  Sleep: Good  Appetite:  Good  Suicidal Ideation:  Plan:  na Intent:  na Means:  na Homicidal Ideation:  Plan:  NA Intent:  NA Means:  NA AEB (as evidenced by):  Psychiatric Specialty Exam: Physical Exam  ROS  Blood pressure 118/86, pulse 102, temperature 97.5 F (36.4 C), temperature source Oral, resp. rate 20, height 6\' 2"  (1.88 m), weight 84.823 kg (187 lb).Body mass index is 24 kg/(m^2).  General Appearance: Casual and Fairly Groomed  Engineer, water::  Good  Speech:  Clear  and Coherent and Normal Rate  Volume:  Normal  Mood:  "GOOD, BETTER"  Affect:  Appropriate and Congruent  Thought Process:  Coherent and Goal Directed  Orientation:  Full (Time, Place, and Person)  Thought Content:  WDL  Suicidal Thoughts:  No  Homicidal Thoughts:  No  Memory:  Immediate;   Good Recent;   Good Remote;   Good  Judgement:  Good  Insight:  Good  Psychomotor Activity:  Normal  Concentration:  Good  Recall:  NA  Fund of Knowledge:Good  Language: Good  Akathisia:  NA  Handed:  Right  AIMS (if indicated):     Assets:  Desire for Improvement  Sleep:  Number of Hours: 7   Musculoskeletal: Strength & Muscle Tone: within normal limits Gait & Station: normal Patient leans: N/A  Current Medications: Current Facility-Administered Medications  Medication Dose Route Frequency Provider Last Rate Last Dose  . acetaminophen (TYLENOL) tablet 650 mg  650 mg Oral Q6H PRN Clarene Reamer, MD      . albuterol (PROVENTIL HFA;VENTOLIN HFA) 108 (90 BASE) MCG/ACT inhaler 2 puff  2 puff Inhalation Q6H PRN Clarene Reamer, MD      . alum & mag hydroxide-simeth (MAALOX/MYLANTA) 200-200-20 MG/5ML suspension 30 mL  30 mL Oral Q4H PRN Clarene Reamer, MD      . cyclobenzaprine (FLEXERIL) tablet 10 mg  10 mg Oral QHS Laverle Hobby, PA-C  10 mg at 04/05/14 2229  . diazepam (VALIUM) tablet 10 mg  10 mg Oral QHS Nena Polio, PA-C       Followed by  . [START ON 04/07/2014] diazepam (VALIUM) tablet 5 mg  5 mg Oral QHS Nena Polio, PA-C       Followed by  . [START ON 04/08/2014] diazepam (VALIUM) tablet 2.5 mg  2.5 mg Oral QHS Nena Polio, PA-C      . docusate sodium (COLACE) capsule 100 mg  100 mg Oral Daily Durward Parcel, MD   100 mg at 04/06/14 3474  . DULoxetine (CYMBALTA) DR capsule 120 mg  120 mg Oral Daily Durward Parcel, MD   120 mg at 04/06/14 0838  . lidocaine (LIDODERM) 5 % 1 patch  1 patch Transdermal Daily Durward Parcel, MD   1 patch at  04/06/14 [phone]  . magnesium hydroxide (MILK OF MAGNESIA) suspension 30 mL  30 mL Oral Daily PRN Clarene Reamer, MD      . nicotine polacrilex (NICORETTE) gum 2 mg  2 mg Oral PRN Nena Polio, PA-C   2 mg at 04/06/14 1313  . OLANZapine zydis (ZYPREXA) disintegrating tablet 10 mg  10 mg Oral QHS Durward Parcel, MD   10 mg at 04/05/14 2229  . pantoprazole (PROTONIX) EC tablet 80 mg  80 mg Oral Daily Laverle Hobby, PA-C   80 mg at 04/06/14 6387  . promethazine (PHENERGAN) tablet 12.5 mg  12.5 mg Oral Q8H PRN Durward Parcel, MD   12.5 mg at 04/04/14 0429  . traZODone (DESYREL) tablet 50 mg  50 mg Oral QHS,MR X 1 Laverle Hobby, PA-C        Lab Results: No results found for this or any previous visit (from the past 48 hour(s)).  Physical Findings: AIMS: Facial and Oral Movements Muscles of Facial Expression: None, normal Lips and Perioral Area: None, normal Jaw: None, normal Tongue: None, normal,Extremity Movements Upper (arms, wrists, hands, fingers): None, normal Lower (legs, knees, ankles, toes): None, normal, Trunk Movements Neck, shoulders, hips: None, normal, Overall Severity Severity of abnormal movements (highest score from questions above): None, normal Incapacitation due to abnormal movements: None, normal Patient's awareness of abnormal movements (rate only patient's report): No Awareness, Dental Status Current problems with teeth and/or dentures?: No Does patient usually wear dentures?: No  CIWA:    COWS:  COWS Total Score: 5  Treatment Plan Summary: Daily contact with patient to assess and evaluate symptoms and progress in treatment Medication management  Plan:Plan: Continue with plan of care Continue crisis management Encourage to participate in group and individual sessions Continue medication management/ and review as needed Cymbalta 120 mg po daily for depression Olanzapine 10 mg po qhs for mood Trazodone 50 mg po qhs for sleep as  needed Discharge  Plan in progress Address health issues /V/S as needed    Medical Decision Making Problem Points:  Established problem, stable/improving (1) Data Points:  Review and summation of old records (2)  I certify that inpatient services furnished can reasonably be expected to improve the patient's condition.   Delfin Gant  PMHNP-BC 04/06/2014, 2:27 PM

## 2014-04-06 NOTE — Progress Notes (Signed)
Adult Psychoeducational Group Note  Date:  04/06/2014 Time:  5:05 PM  Group Topic/Focus:  Recovery Goals:   The focus of this group is to identify appropriate goals for recovery and establish a plan to achieve them.  Participation Level:  Minimal  Participation Quality:  Attentive  Affect:  Flat  Cognitive:  Appropriate  Insight: Limited  Engagement in Group:  Engaged  Modes of Intervention:  Education  Additional Comments:  Patient stated when he leaves his goals are to stay busy and create a routine. Patient stated his mother and his neighbors are his support system.  Oralia Manis 04/06/2014, 5:05 PM

## 2014-04-06 NOTE — BHH Group Notes (Addendum)
[place] Group Notes:  (Nursing/MHT/Case Management/Adjunct)  Date:  04/06/2014  Time:  11:37 AM  Type of Therapy:  Psychoeducational Skills  Participation Level:  Active  Participation Quality:  Appropriate  Affect:  Appropriate  Cognitive:  Appropriate  Insight:  Appropriate  Engagement in Group:  Engaged  Modes of Intervention:  Discussion  Summary of Progress/Problems: Pt did attend self inventory group, pt reported that he was negative SI/HI, no AH/VH noted. Pt rated his depression as a 2, and his helplessness/hopelessness as a 2.     Pt reported concerns about his medication, pt advised that doctor will be made aware of his concerns.   Jaquetta Currier Shanta Quiara Killian 04/06/2014, 11:37 AM

## 2014-04-07 MED ORDER — TRAZODONE HCL 50 MG PO TABS
25.0000 mg | ORAL_TABLET | Freq: Every evening | ORAL | Status: DC | PRN
  Filled 2014-04-07: qty 7

## 2014-04-07 MED ORDER — OLANZAPINE 5 MG PO TABS
5.0000 mg | ORAL_TABLET | Freq: Once | ORAL | Status: AC
  Administered 2014-04-07: 5 mg via ORAL
  Filled 2014-04-07: qty 1
  Filled 2014-04-07: qty 2

## 2014-04-07 NOTE — Progress Notes (Signed)
The patient did not attend group.

## 2014-04-07 NOTE — BHH Group Notes (Signed)
[place] Group Notes:  (Clinical Social Work)  04/07/2014   1:15-2:15PM  Summary of Progress/Problems: The main focus of today's process group was to decide on additional supports that can be put in place to help patients remain mentally healthy and in the community. There was an extensive discussion about what constitutes a healthy support versus an unhealthy support. An emphasis was placed on using counselor, doctor, therapy groups, 12-step groups, and problem-specific support groups to expand supports. The patient expressed himself well thoughout group, had a lot of comments and questions, was very interactive.  He stated he has no outside supports currently, so anything will be new to him.  He is interested in groups and seeing a therapist.  Type of Therapy:  Process Group  Participation Level:  Active  Participation Quality:  Attentive and Sharing  Affect:  Flat and anxious  Cognitive:  Appropriate and Oriented  Insight:  Developing/Improving  Engagement in Therapy:  Engaged  Modes of Intervention:  Education,  Support and [employer], LCSW 04/07/2014, 4:00pm

## 2014-04-07 NOTE — Progress Notes (Signed)
D) Pt coming to this writer stating "I am feeling agitated and anxious. I need to have something to help me. I don't want to get angry and mess up my stay here. A) Talked with the NP who ordered 5mg  of Zyprexa to help with the anxiety. R) Pt feelings better after his mother leaft and spent time in a 1:1 with this Probation officer.

## 2014-04-07 NOTE — Progress Notes (Signed)
[facility] MD Progress Note  04/07/2014 12:03 PM Carl Hughes  MRN:  [id number] Subjective:   Interviewed patient this morning who stated he is improving.  Patient was admitted last week for Major depression and he was suicidal at that time.  He denies SI/HI/AVH.  He reported feeling much better, sleeping and eating.  Patient reported 2 days of feeling dizzy in am waking up from sleep.  He reported that he felt more dizzy today.  He was wondering if his medications are causing the dizziness.   Patient stated that over all he is feeing a lot better, participates in group and takes his medications.  We will continue our plan of care, we will decrease his dosage of Trazodone from 50 mg po to 25 mg po QHS.  Patient is in agreement with this plan of care. Diagnosis:   DSM5: Schizophrenia Disorders:   Obsessive-Compulsive Disorders:   Trauma-Stressor Disorders:  Substance/Addictive Disorders:  Depressive Disorders:   Total Time spent with patient: 20 minutes  Axis I: Major Depression, Recurrent severe Axis II: Deferred Axis III:  Past Medical History  Diagnosis Date  . Anxiety   . Depression   . GERD (gastroesophageal reflux disease)   . Tobacco use disorder   . Gallbladder polyp   . IBS (irritable bowel syndrome)   . Chronic back pain   . Exercise-induced asthma   . Chronic back pain   . Polyarthralgia   . Bipolar disorder     Dr. Pearson Grippe, [facility]  . Wears glasses    Axis IV: other psychosocial or environmental problems and problems related to social environment Axis V: 41-50 serious symptoms  ADL's:  Intact  Sleep: Good  Appetite:  Good  Psychiatric Specialty Exam: Physical Exam  ROS  Blood pressure 115/80, pulse 94, temperature 97.9 F (36.6 C), temperature source Oral, resp. rate 16, height 6\' 2"  (1.88 m), weight 84.823 kg (187 lb).Body mass index is 24 kg/(m^2).  General Appearance: Casual and Fairly Groomed  Engineer, water::  Good  Speech:  Clear and  Coherent and Normal Rate  Volume:  Normal  Mood:  Depressed  Affect:  Congruent and Depressed  Thought Process:  Coherent and Goal Directed  Orientation:  Full (Time, Place, and Person)  Thought Content:  WDL  Suicidal Thoughts:  No  Homicidal Thoughts:  No  Memory:  Immediate;   Good Recent;   Good Remote;   Good  Judgement:  Fair  Insight:  Good  Psychomotor Activity:  Normal  Concentration:  Good  Recall:  NA  Fund of Knowledge:Good  Language: Good  Akathisia:  NA  Handed:  Right  AIMS (if indicated):     Assets:  Desire for Improvement  Sleep:  Number of Hours: 4   Musculoskeletal: Strength & Muscle Tone: within normal limits Gait & Station: normal Patient leans: N/A  Current Medications: Current Facility-Administered Medications  Medication Dose Route Frequency Provider Last Rate Last Dose  . acetaminophen (TYLENOL) tablet 650 mg  650 mg Oral Q6H PRN Clarene Reamer, MD      . albuterol (PROVENTIL HFA;VENTOLIN HFA) 108 (90 BASE) MCG/ACT inhaler 2 puff  2 puff Inhalation Q6H PRN Clarene Reamer, MD      . alum & mag hydroxide-simeth (MAALOX/MYLANTA) 200-200-20 MG/5ML suspension 30 mL  30 mL Oral Q4H PRN Clarene Reamer, MD      . cyclobenzaprine (FLEXERIL) tablet 10 mg  10 mg Oral QHS Laverle Hobby, PA-C   10  mg at 04/06/14 2227  . diazepam (VALIUM) tablet 5 mg  5 mg Oral QHS Nena Polio, PA-C       Followed by  . [START ON 04/08/2014] diazepam (VALIUM) tablet 2.5 mg  2.5 mg Oral QHS Nena Polio, PA-C      . docusate sodium (COLACE) capsule 100 mg  100 mg Oral Daily Durward Parcel, MD   100 mg at 04/07/14 0910  . DULoxetine (CYMBALTA) DR capsule 120 mg  120 mg Oral Daily Durward Parcel, MD   120 mg at 04/07/14 0910  . lidocaine (LIDODERM) 5 % 1 patch  1 patch Transdermal Daily Durward Parcel, MD   1 patch at 04/07/14 0910  . magnesium hydroxide (MILK OF MAGNESIA) suspension 30 mL  30 mL Oral Daily PRN Clarene Reamer, MD      .  nicotine polacrilex (NICORETTE) gum 2 mg  2 mg Oral PRN Nena Polio, PA-C   2 mg at 04/07/14 0916  . OLANZapine zydis (ZYPREXA) disintegrating tablet 10 mg  10 mg Oral QHS Durward Parcel, MD   10 mg at 04/06/14 2227  . pantoprazole (PROTONIX) EC tablet 80 mg  80 mg Oral Daily Laverle Hobby, PA-C   80 mg at 04/07/14 2633  . promethazine (PHENERGAN) tablet 12.5 mg  12.5 mg Oral Q8H PRN Durward Parcel, MD   12.5 mg at 04/04/14 0429  . traZODone (DESYREL) tablet 25 mg  25 mg Oral QHS PRN Delfin Gant, NP        Lab Results: No results found for this or any previous visit (from the past 48 hour(s)).  Physical Findings: AIMS: Facial and Oral Movements Muscles of Facial Expression: None, normal Lips and Perioral Area: None, normal Jaw: None, normal Tongue: None, normal,Extremity Movements Upper (arms, wrists, hands, fingers): None, normal Lower (legs, knees, ankles, toes): None, normal, Trunk Movements Neck, shoulders, hips: None, normal, Overall Severity Severity of abnormal movements (highest score from questions above): None, normal Incapacitation due to abnormal movements: None, normal Patient's awareness of abnormal movements (rate only patient's report): No Awareness, Dental Status Current problems with teeth and/or dentures?: No Does patient usually wear dentures?: No  CIWA:    COWS:  COWS Total Score: 5  Treatment Plan Summary: Daily contact with patient to assess and evaluate symptoms and progress in treatment Medication management  Plan: Plan: Continue with plan of care Continue crisis management Encourage to participate in group and individual sessions Continue medication management/ and review as needed Cymbalta 120 mg po daily for depression  Olanzapine 10 mg po qhs for mood  Trazodone 25 mg po qhs for sleep as needed  Discharge  Plan in progress Address health issues /V/S as needed    Medical Decision Making Problem Points:  Established  problem, stable/improving (1) Data Points:  Review and summation of old records (2) Review of medication regiment & side effects (2) Review of new medications or change in dosage (2)  I certify that inpatient services furnished can reasonably be expected to improve the patient's condition.   Delfin Gant   PMHNP-BC 04/07/2014, 12:03 PM

## 2014-04-07 NOTE — Progress Notes (Signed)
Psychoeducational Group Note  Date:  04/07/2014 Time:  1015  Group Topic/Focus:  Making Healthy Choices:   The focus of this group is to help patients identify negative/unhealthy choices they were using prior to admission and identify positive/healthier coping strategies to replace them upon discharge.  Participation Level:  Active  Participation Quality:  Appropriate  Affect:  Appropriate  Cognitive:  Oriented  Insight:  Improving  Engagement in Group:  Engaged   Additional Comments:  Pt has attended the group and participated.   Paulino Rily 04/07/2014

## 2014-04-07 NOTE — Progress Notes (Signed)
Adult Psychoeducational Group Note  Date:  04/07/2014 Time:  12:22 AM  Group Topic/Focus:  Wrap-Up Group:   The focus of this group is to help patients review their daily goal of treatment and discuss progress on daily workbooks.  Participation Level:  Active  Participation Quality:  Appropriate  Affect:  Appropriate  Cognitive:  Appropriate  Insight: Appropriate  Engagement in Group:  Engaged  Modes of Intervention:  Discussion  Additional Comments:  The patient expressed that his day was good.The patient also said that his medications  Made him feel very drowsy but he felt that would get better.  Thayer Dallas Danelly Hassinger 04/07/2014, 12:22 AM

## 2014-04-07 NOTE — Progress Notes (Signed)
D: Patient in the hallway on approach.  Patient states he feels better.  Patient states he has been taking his mediation and states his pain has been controllable.  Patient states his plan is to try to get a job [place] when discharge.  Patient states hopefully his mother with be able to transport him back and forth.  Patient denies SI/HI and denies AVH.   A: Staff to monitor Q 15 mins for safety.  Encouragement and support offered.  Scheduled medications administered per orders. R: Patient remains safe on the unit.  Patient attended group tonight.  Patient visible on the unit and interacting with peers. Patient taking administered medications.

## 2014-04-07 NOTE — Progress Notes (Signed)
D) Pt rates his depression and hopelessness both at as 1 and denies SI and HI. Affect and mood are brighter today. Pt continues to be focused on his bowels and has requested a laxative because "I went yesterday but haven't gone to day yet". Talked with Pt and encouraged Pt to wait till this evening to see if he needs the laxative.  Pt is talking more with his peers and with staff. Affect is less intense and more spontaneous. A) Given support, reassurance and praise. Therapeutic humor used with Pt. In his 1:1      R) Pt denies SI and HI. States he is feeling better.

## 2014-04-07 NOTE — Progress Notes (Signed)
Psychoeducational Group Note  Date: 04/07/2014 Time: 0930 Group Topic/Focus:  Gratefulness:  The focus of this group is to help patients identify what two things they are most grateful for in their lives. What helps ground them and to center them on their work to their recovery.  Participation Level:  Active   Participation Quality:  Appropriate  Affect:  Appropriate  Cognitive:  Oriented  Insight:  Improving  Engagement in Group:  Engaged  Additional Comments:  Pt attended the group, participated and engaged.  Carl Hughes

## 2014-04-08 NOTE — BHH Group Notes (Signed)
[facility] LCSW Aftercare Discharge Planning Group Note   04/08/2014 9:47 AM    Participation Quality:  Appropraite  Mood/Affect:  Appropriate  Depression Rating:  0  Anxiety Rating:  0  Thoughts of Suicide:  No  Will you contract for safety?   NA  Current AVH:  No  Plan for Discharge/Comments:  Patient attended discharge planning group and actively participated in group.  He will follow up with [facility].  CSW provided all participants with daily workbook.   Transportation Means: Patient has transportation.   Supports:  Patient has a support system.   Telina Kleckley, Eulas Post

## 2014-04-08 NOTE — Progress Notes (Signed)
D: Patient in the hallway on approach.  Patient states he feels he is doing better.  Patient states he was upset earlier today during lunch because his mother upset him over the phone.  Patient states he feels he would benefit from having Zyprexa during the day.  Patient denies SI/HI and denies AVH.   A: Staff to monitor Q 15 mins for safety.  Encouragement and support offered.  Scheduled medications administered per orders. R: Patient remains safe on the unit.  Patient attended group tonight.  Patient visible on the unit and interacting with peers.  Patient taking administered medications.

## 2014-04-08 NOTE — Progress Notes (Signed)
Pt attended spiritual care group on grief and loss facilitated by chaplain Jerene Pitch.  Group opened with brief discussion and psycho-social ed around grief and loss in relationships and in relation to self.  Group identified life patterns, circumstances, changes that cause losses. Established group norm of speaking from own life experience. Group goal of establishing open and affirming space for members to share loss and experience with grief, normalize grief experience and provide psycho social education and grief support, identify ways illness intersects with grief.    Carl Hughes shared with the group the loss of his daughter, who was stillborn 6 years prior.  Identified with another patient who had lost children in infancy.  Patients reported that grief around loss of children does not "go away."  Described loss of hopes for children and grief around times when their children would have major life events.  Both identified going to grave site as a way that they connect with their child's memory and continue to find peace with their loss.   Both patients identified anger as a component of their grief and spoke of the difficulty in finding peace with lingering anger.    [place]

## 2014-04-08 NOTE — Tx Team (Signed)
Interdisciplinary Treatment Plan Update   Date Reviewed:  04/08/2014  Time Reviewed:  9:54 AM  Progress in Treatment:   Attending groups: Yes Participating in groups: Yes Taking medication as prescribed: Yes  Tolerating medication: Yes Family/Significant other contact made:  Yes, collateral contact with mother. Patient understands diagnosis: Yes  Discussing patient identified problems/goals with staff: Yes Medical problems stabilized or resolved: Yes Denies suicidal/homicidal ideation: Yes Patient has not harmed self or others: Yes  For review of initial/current patient goals, please see plan of care.  Estimated Length of Stay: 1 day - discharge Tuesday  Reasons for Continued Hospitalization:  Medication Management  New Problems/Goals identified:    Discharge Plan or Barriers:   Home with outpatient follow up with [facility]  Additional Comments:  Patient has declined to allow mother to meet with treatment team.  Attendees:  Patient:   04/08/2014 9:54 AM   Signature: Mylinda Latina, MD 04/08/2014 9:54 AM  Signature:  Nena Polio, PA 04/08/2014 9:54 AM  Signature:   Clinton Sawyer, RN 04/08/2014 9:54 AM  Signature:Beverly Danelle Earthly, RN 04/08/2014 9:54 AM  Signature:   04/08/2014 9:54 AM  Signature:  Joette Catching, LCSW 04/08/2014 9:54 AM  Signature:  Regan Lemming, LCSW 04/08/2014 9:54 AM  Signature:  Lucinda Dell, Care Coordinator [facility] 04/08/2014 9:54 AM  Signature:   04/08/2014 9:54 AM  Signature:  04/08/2014  9:54 AM  Signature:   Lars Pinks, RN [facility] 04/08/2014  9:54 AM  Signature: 04/08/2014  9:54 AM    Scribe for Treatment Team:   Joette Catching,  04/08/2014 9:54 AM

## 2014-04-08 NOTE — Progress Notes (Signed)
Adult Psychoeducational Group Note  Date:  04/08/2014 Time:  08:00pm Group Topic/Focus:  Wrap-Up Group:   The focus of this group is to help patients review their daily goal of treatment and discuss progress on daily workbooks.  Participation Level:  Active  Participation Quality:  Appropriate  Affect:  Appropriate  Cognitive:  Alert and Appropriate  Insight: Appropriate  Engagement in Group:  Engaged  Modes of Intervention:  Discussion and Education  Additional Comments:  Pt attended and participated in group. Policy of the unit discussed and pt was asked How was your day? Pt stated my day was  Good although I thought I was going home.  Gailen Shelter 04/08/2014, 9:10 PM

## 2014-04-08 NOTE — BHH Group Notes (Signed)
[place] LCSW Group Therapy          Overcoming Obstacles       1:15 -2:30        04/08/2014       Type of Therapy:  Group Therapy  Participation Level:  Appropriate  Participation Quality:  Appropriate  Affect:  Appropriate, Alert  Cognitive:  Attentive Appropriate  Insight: Developing/Improving Engaged  Engagement in Therapy: Developing/Imprvoing Engaged  Modes of Intervention:  Discussion Exploration  Education Rapport BuildingProblem-Solving Support  Summary of Progress/Problems:  The main focus of today's group was overcoming obstacles.  Patient shared the obstacle he has to overcome is coming off as a "jerk."  Patient advised it has been a long time since he has had a friend. He stated he gives the impression of not listening or caring and wants to change that perceptions.  Patient able to identify appropriate coping skills including showing more concern for others. He was encouraged to take advantage of services offered by the [facility].   Concha Pyo 04/08/2014

## 2014-04-08 NOTE — Progress Notes (Addendum)
D:  Patient's self inventory sheet, patient sleeps well, good appetite, normal energy level, good attention span.  Denied depression and hopeless.   Rated anxiety 1.  Denied withdrawals  Denied SI.  Zero pain, zero pain goal.  Plans to get a job and stay on routine schedule.  Plans to discharge home with his mother.   No problems with medications after discharge. A:  Medications administered per MD orders.  Emotional support and encouragement given patient. R:  Denied SI and HI, contracts for safety.  Denied A/V hallucinations.  Will continue to monitor patient for safety with 15 minute checks.  Safety maintained.  1500  Patient talked to nurse about his mother's behavior toward him.  Stated his mother had to put him in [place] when she went to the beach approximately 3 years ago.  Patient stayed at [facility] about 2 weeks.  Mother was at the beach one week.  Patient stated the reason he is here today is that he woke up from stomach bug, came downstairs, stomach was ill, came to talk to mom about medications, and mom continued to talk to his sisters and ignore him.  Eventually he gets fed up and argues with his mother and they are yelling at each other in front of his sisters, told sisters it is very rude to easedrop and in [place] someone could be killed because they are easedropping.   Patient stated he was just trying to make his stomach to feel better, mom calls police to take him to [facility].  Patient went to [facility] ED and ED was about to let him go and then patient was served with affidavit saying that he was not taking his medication while his mother was in control of the medications.  Patient stated this is how he came to be a patient at [facility]     Patient stated his dad thinks he is in an asylum.  Patient's dad  is disappointment when he is at [facility].

## 2014-04-09 MED ORDER — DULOXETINE HCL 60 MG PO CPEP: 120.0000 mg | ORAL_CAPSULE | ORAL | Status: DC

## 2014-04-09 MED ORDER — OLANZAPINE 10 MG PO TBDP: 10.0000 mg | ORAL_TABLET | Freq: Every day | ORAL | Status: DC

## 2014-04-09 MED ORDER — TRAZODONE 25 MG HALF TABLET: 25.0000 mg | ORAL_TABLET | Freq: Every evening | ORAL | Status: DC | PRN

## 2014-04-09 MED ORDER — DEXLANSOPRAZOLE 60 MG PO CPDR: 120.0000 mg | DELAYED_RELEASE_CAPSULE | Freq: Every morning | ORAL | Status: DC

## 2014-04-09 NOTE — Progress Notes (Signed)
Discharge Note:  Patient discharged home with his mother.   Patient stated he received all his belongings, clothing, misc items, toiletries, prescriptions, medications, tennis shoes, belt, bandana, wallet with wallet chain, cell phone, cigarettes with lighter, $20 bill.   Suicide prevention information given and reviewed, and patient stated he understood and had no questions.  Denied SI and HI.  Denied A/V hallucinations.  Patient stated he appreciated all assistance received from [facility] staff.

## 2014-04-09 NOTE — Progress Notes (Signed)
The focus of this group is to educate the patient on the purpose and policies of crisis stabilization and provide a format to answer questions about their admission.  The group details unit policies and expectations of patients while admitted.  Patient attended 0900 nurse education orientation group this morning.  Patient actively participated, appropriate affect, alert, appropriate insight and engagement.  Today patient will work on 3 goals for discharge.  

## 2014-04-09 NOTE — BHH Group Notes (Signed)
[facility] LCSW Group Therapy      Feelings About Diagnosis 1:15 - 2:30 PM         04/09/2014    Type of Therapy:  Group Therapy  Participation Level:  Active  Participation Quality:  Appropriate  Affect:  Appropriate  Cognitive:  Appropriate  Insight:  Developing/Improving and Engaged  Engagement in Therapy:  Developing/Improving and Engaged  Modes of Intervention:  Discussion, Education, Exploration, Problem-Solving, Rapport Building, Support  Summary of Progress/Problems:  Patient actively participated in group. Patient discussed past and present diagnosis and the effects it has had on  life.  Patient talked about family and society being judgmental and the stigma associated with having a mental health diagnosis.  He shared he feels he is not meeting the expectations he has for himself and those of others due to his illness.  Patient shared he wants to get better to get a job, an apartment and an "old beat up truck."  Carl Hughes 04/09/2014

## 2014-04-09 NOTE — BHH Suicide Risk Assessment (Addendum)
[place] INPATIENT:  Family/Significant Other Suicide Prevention Education  Suicide Prevention Education:   Probation officer spoke with mother who advised SPE was not reviewed when we spoke on 04/04/14 Education Completed;  Janith Lima, Mother, [phone]; has been identified by the patient as the family member/significant other with whom the patient will be residing, and identified as the person(s) who will aid the patient in the event of a mental health crisis (suicidal ideations/suicide attempt).  With written consent from the patient, the family member/significant other has been provided the following suicide prevention education, prior to the and/or following the discharge of the patient.  The suicide prevention education provided includes the following:  Suicide risk factors  Suicide prevention and interventions  National Suicide Hotline telephone number  [facility] assessment telephone number  [facility] Emergency Assistance [place] and/or Residential Mobile Crisis Unit telephone number  Request made of family/significant other to:  Remove weapons (e.g., guns, rifles, knives), all items previously/currently identified as safety concern.  Mother advised patient does not have access to weapons.   Remove drugs/medications (over-the-counter, prescriptions, illicit drugs), all items previously/currently identified as a safety concern.  The family member/significant other verbalizes understanding of the suicide prevention education information provided.  The family member/significant other agrees to remove the items of safety concern listed above.  Brenetta Penny Hairston Nakina Spatz 04/09/2014, 10:47 AM

## 2014-04-09 NOTE — Progress Notes (Addendum)
[facility] Adult Case Management Discharge Plan :  Will you be returning to the same living situation after discharge: Yes,  Patient is returning home with mother. At discharge, do you have transportation home?:Yes,  Patient to arrange transportation home. Do you have the ability to pay for your medications:Yes,  Patient is able to obtain medications.  Release of information consent forms completed and in the chart;  Patient's signature needed at discharge.  Patient to Follow up at: Follow-up Information   Follow up with Dr. Albertine Patricia - [facility] On 04/17/2014. (Wednesday, April 17, 2014 at 2:30 PM)    Contact information:   [address] [place], [place]   [address]  [facility] [address] [place], [place]   [address]       [phone]  Friday, April 12, 2014 at 8:45 AM  Patient denies SI/HI: Patient no longer endorsing SI/HI or other thoughts of self harm.   Safety Planning and Suicide Prevention discussed:  .Reviewed with all patients during discharge planning group  [place] 04/09/2014, 11:00 AM

## 2014-04-09 NOTE — [facility] Suicide Risk Assessment (Signed)
   Demographic Factors:  Male, Adolescent or young adult, Caucasian, Low socioeconomic status and Unemployed  Total Time spent with patient: 30 minutes  Psychiatric Specialty Exam: Physical Exam  ROS  Blood pressure 120/80, pulse 107, temperature 96.9 F (36.1 C), temperature source Oral, resp. rate 16, height 6\' 2"  (1.88 m), weight 84.823 kg (187 lb).Body mass index is 24 kg/(m^2).  General Appearance: Casual  Eye Contact::  Good  Speech:  Clear and Coherent  Volume:  Normal  Mood:  Anxious  Affect:  Appropriate and Congruent  Thought Process:  Coherent and Goal Directed  Orientation:  Full (Time, Place, and Person)  Thought Content:  WDL  Suicidal Thoughts:  No  Homicidal Thoughts:  No  Memory:  Immediate;   Good Recent;   Good  Judgement:  Good  Insight:  Good  Psychomotor Activity:  Normal  Concentration:  Good  Recall:  Good  Fund of Knowledge:Good  Language: Good  Akathisia:  NA  Handed:  Right  AIMS (if indicated):     Assets:  Communication Skills Desire for Improvement Financial Resources/Insurance Housing Intimacy Leisure Time Physical Health Resilience Social Support Talents/Skills Transportation  Sleep:  Number of Hours: 4.75    Musculoskeletal: Strength & Muscle Tone: within normal limits Gait & Station: normal Patient leans: N/A   Mental Status Per Nursing Assessment::   On Admission:  NA  Current Mental Status by Physician: NA  Loss Factors: NA  Historical Factors: NA  Risk Reduction Factors:   Sense of responsibility to family, Religious beliefs about death, Living with another person, especially a relative, Positive social support, Positive therapeutic relationship and Positive coping skills or problem solving skills  Continued Clinical Symptoms:  Depression:   Recent sense of peace/wellbeing Chronic Pain Unstable or Poor Therapeutic Relationship Previous Psychiatric Diagnoses and Treatments Medical Diagnoses and  Treatments/Surgeries  Cognitive Features That Contribute To Risk:  Polarized thinking    Suicide Risk:  Minimal: No identifiable suicidal ideation.  Patients presenting with no risk factors but with morbid ruminations; may be classified as minimal risk based on the severity of the depressive symptoms  Discharge Diagnoses:   AXIS I:  Major Depression, Recurrent severe AXIS II:  Deferred AXIS III:   Past Medical History  Diagnosis Date  . Anxiety   . Depression   . GERD (gastroesophageal reflux disease)   . Tobacco use disorder   . Gallbladder polyp   . IBS (irritable bowel syndrome)   . Chronic back pain   . Exercise-induced asthma   . Chronic back pain   . Polyarthralgia   . Bipolar disorder     Dr. Pearson Grippe, [facility]  . Wears glasses    AXIS IV:  other psychosocial or environmental problems, problems related to social environment and problems with primary support group AXIS V:  61-70 mild symptoms  Plan Of Care/Follow-up recommendations:  Activity:  As tolerated Diet:  Regular  Is patient on multiple antipsychotic therapies at discharge:  No   Has Patient had three or more failed trials of antipsychotic monotherapy by history:  No  Recommended Plan for Multiple Antipsychotic Therapies: NA    Carl Hughes 04/09/2014, 11:14 AM

## 2014-04-09 NOTE — Progress Notes (Signed)
Recreation Therapy Notes  Animal-Assisted Activity/Therapy (AAA/T) Program Checklist/Progress Notes Patient Eligibility Criteria Checklist & Daily Group note for Rec Tx Intervention  Date: 06.02.2015 Time: 2:45pm Location: [address]    AAA/T Program Assumption of Risk Form signed by Patient/ or Parent Legal Guardian yes  Patient is free of allergies or sever asthma yes  Patient reports no fear of animals yes  Patient reports no history of cruelty to animals yes   Patient understands his/her participation is voluntary yes  Patient washes hands before animal contact yes  Patient washes hands after animal contact yes  Behavioral Response: Appropriate   Education: Hand Washing, Appropriate Animal Interaction   Education Outcome: Acknowledges understanding  Clinical Observations/Feedback: Patient interacted appropriately with therapeutic dog team, peers and LRT, as well as shared stories about her pets with group members.   Laureen Ochs Jamyia Fortune, LRT/CTRS  Lane Hacker 04/09/2014 4:37 PM

## 2014-04-09 NOTE — Discharge Summary (Signed)
Physician Discharge Summary Note  Patient:  Carl Hughes is an 23 y.o., male MRN:  [id number] DOB:  [birth year] Patient phone:  [phone] (home)  Patient address:   [place] [address],  Total Time spent with patient: 30 minutes  Date of Admission:  04/03/2014 Date of Discharge: 04/09/2014  Reason for Admission: MDD with SI  Discharge Diagnoses: Active Problems:   MDD (major depressive disorder), recurrent severe, without psychosis   Psychiatric Specialty Exam: Please see D/C SRA Physical Exam  ROS  Blood pressure 120/80, pulse 107, temperature 96.9 F (36.1 C), temperature source Oral, resp. rate 16, height 6\' 2"  (1.88 m), weight 84.823 kg (187 lb).Body mass index is 24 kg/(m^2).   Past Psychiatric History: Diagnosis:  MDDwith SI  Hospitalizations:  3 previous  Outpatient Care:     Dr. Albertine Patricia  Substance Abuse Care:  denies  Self-Mutilation:     None  Suicidal Attempts:  None  Violent Behaviors:  None but did make threatening gestures toward sister  DSM5: Discharge Diagnoses:  AXIS I: Major Depression, Recurrent severe  AXIS II: Deferred  AXIS III:  Past Medical History   Diagnosis  Date   .  Anxiety    .  Depression    .  GERD (gastroesophageal reflux disease)    .  Tobacco use disorder    .  Gallbladder polyp    .  IBS (irritable bowel syndrome)    .  Chronic back pain    .  Exercise-induced asthma    .  Chronic back pain    .  Polyarthralgia    .  Bipolar disorder      Dr. Pearson Grippe, [facility]   .  Wears glasses     AXIS IV: other psychosocial or environmental problems, problems related to social environment and problems with primary support group  AXIS V: 61-70 mild symptoms  Level of Care:  OP  Hospital Course:  Carl Hughes is a 23 y.o. male who presents via IVC petition, initiated by mother. Pt states he had a stomach ache and was irritable due to the pain and demanding his mother to find medication to help  him with his stomach issues.        Pt states that his mother misunderstood his demeanor and felt that his behavior was threatening and IVC'd patient so he could be transported to the [facility] for a psych evaluation. Pt denies SI/HI/AVH. Per petition, pt is non complaint with taking his medications and has verbalized SI thoughts and made threatening gestures towards his siblings, frightening them. Pt allegedly asked his mother to let him kill himself.         Carl Hughes was admitted to the adult unit. He was evaluated and his symptoms were identified. Medication management was discussed and initiated. He was oriented to the unit and encouraged to participate in unit programming. Medical problems were identified and treated appropriately. Home medication was restarted as needed.        The patient was evaluated each day by a clinical provider to ascertain the patient's response to treatment.  Improvement was noted by the patient's report of decreasing symptoms, improved sleep and appetite, affect, medication tolerance, behavior, and participation in unit programming.  He was asked each day to complete a self inventory noting mood, mental status, pain, new symptoms, anxiety and concerns.           He seemed focused on getting pain  medication and anti anxiolytic medication which seemed out of proportion to his affect or level of discomfort. He did admit a history of Benzo abuse and probation for felony possession with intent to distribute drugs in his past.        His private psychiatrist Dr. Albertine Patricia called and spoke with staff psychiatrist requesting that he be detoxed off of all benzodiazepines.         He responded well to medication and being in a therapeutic and supportive environment. Positive and appropriate behavior was noted.   Coping skills, problem solving as well as relaxation therapies were also part of the unit programming.  Carl Hughes little forward thinking and appeared to be dependent on his  mother or others for his well being and upkeep. He Hughes little insight into marijuana abuse and the implications of a positive drug screen on a job application. 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         By the day of discharge he was in much improved condition than upon admission.  Symptoms were reported as significantly decreased or resolved completely. The patient denied SI/HI and voiced no AVH. He was motivated to continue taking medication with a goal of continued improvement in mental health.          Carl Hughes was discharged home with a plan to follow up as noted below.   Consults:  None  Significant Diagnostic Studies:  None  Discharge Vitals:   Blood pressure 120/80, pulse 107, temperature 96.9 F (36.1 C), temperature source Oral, resp. rate 16, height 6\' 2"  (1.88 m), weight 84.823 kg (187 lb). Body mass index is 24 kg/(m^2). Lab Results:   No results found for this or any previous visit (from the past 72 hour(s)).  Physical Findings: AIMS: Facial and Oral Movements Muscles of Facial Expression: None, normal Lips and Perioral Area: None, normal Jaw: None, normal Tongue: None, normal,Extremity Movements Upper (arms, wrists, hands, fingers): None, normal Lower (legs, knees, ankles, toes): None, normal, Trunk Movements Neck, shoulders, hips: None, normal, Overall Severity Severity of abnormal movements (highest score from questions above): None, normal Incapacitation due to abnormal movements: None, normal Patient's awareness of abnormal movements (rate only patient's report): No Awareness, Dental Status Current problems with teeth and/or dentures?: No Does patient usually wear dentures?: No  CIWA:  CIWA-Ar Total: 1 COWS:  COWS Total Score: 3  Psychiatric Specialty Exam: See Psychiatric Specialty Exam and Suicide Risk Assessment completed by Attending Physician prior to discharge.  Discharge destination:  Home  Is patient on multiple  antipsychotic therapies at discharge:  No   Has Patient had three or more failed trials of antipsychotic monotherapy by history:  No  Recommended Plan for Multiple Antipsychotic Therapies: NA  Discharge Instructions   Diet - low sodium heart healthy    Complete by:  As directed      Discharge instructions    Complete by:  As directed   Take all of your medications as directed. Be sure to keep all of your follow up appointments.  If you are unable to keep your follow up appointment, call your Doctor's office to let them know, and reschedule.  Make sure that you have enough medication to last until your appointment. Be sure to get plenty of rest. Going to bed at the same time each night will help. Try to avoid sleeping during the day.  Increase your activity as tolerated. Regular exercise will  help you to sleep better and improve your mental health. Eating a heart healthy diet is recommended. Try to avoid salty or fried foods. Be sure to avoid all alcohol and illegal drugs.     Increase activity slowly    Complete by:  As directed             Medication List    STOP taking these medications       acetaminophen 500 MG tablet  Commonly known as:  TYLENOL     alprazolam 2 MG tablet  Commonly known as:  XANAX     cyclobenzaprine 10 MG tablet  Commonly known as:  FLEXERIL     diazepam 10 MG tablet  Commonly known as:  VALIUM     hydrocortisone 2.5 % cream     lidocaine 5 %  Commonly known as:  LIDODERM      TAKE these medications     Indication   albuterol 108 (90 BASE) MCG/ACT inhaler  Commonly known as:  VENTOLIN HFA  Inhale 2 puffs into the lungs every 6 (six) hours as needed for wheezing.    wheezing, shortness of breath   dexlansoprazole 60 MG capsule  Commonly known as:  DEXILANT  Take 2 capsules (120 mg total) by mouth every morning.   Indication:  Gastroesophageal Reflux Disease     DULoxetine 60 MG capsule  Commonly known as:  CYMBALTA  Take 2 capsules (120 mg  total) by mouth every morning.   Indication:  Fibromyalgia Syndrome     OLANZapine zydis 10 MG disintegrating tablet  Commonly known as:  ZYPREXA  Take 1 tablet (10 mg total) by mouth at bedtime.   Indication:  Manic-Depression     traZODone 25 mg Tabs tablet  Commonly known as:  DESYREL  Take 0.5 tablets (25 mg total) by mouth at bedtime as needed for sleep.   Indication:  Trouble Sleeping           Follow-up Information   Follow up with Dr. Albertine Patricia - [facility] On 04/17/2014. (Wednesday, April 17, 2014 at 2:30 PM)    Contact information:   [address] [place], [place]   [address]  [phone]      Follow up with [facility] On 04/12/2014. (Friday, April 12, 2014 at 8:45 AM)    Contact information:   [address] [place], [place]   [address]  [phone]      Follow-up recommendations:   Activities: Resume activity as tolerated. Diet: Heart healthy low sodium diet Tests: Follow up testing will be determined by your out patient provider. Comments:    Total Discharge Time:  Greater than 30 minutes.  Signed: Marlane Hatcher. [place] RPAC 10:03 PM 04/09/2014  Patient was seen face-to-face for psychiatric evaluation, suicide risk assessment and case discussed with her treatment team on physician extender. Make appropriate disposition plan and  reviewed the information documented and agree with the treatment plan.  Parke Simmers Len Kluver 04/10/2014 1:43 PM
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
## 2014-04-09 NOTE — Clinical Social Work Note (Signed)
CSW spoke with mother who continues to have concerns about patient being discharged.  She advised patient is having night terrors that have not been addressed.  Writer advised MD and PA of mother's concerns.

## 2014-04-12 ENCOUNTER — Other Ambulatory Visit (HOSPITAL_COMMUNITY): Payer: 59 | Attending: Psychiatry | Admitting: Psychiatry

## 2014-04-12 ENCOUNTER — Encounter (HOSPITAL_COMMUNITY): Payer: Self-pay

## 2014-04-12 DIAGNOSIS — F17200 Nicotine dependence, unspecified, uncomplicated: Secondary | ICD-10-CM | POA: Insufficient documentation

## 2014-04-12 DIAGNOSIS — F411 Generalized anxiety disorder: Secondary | ICD-10-CM | POA: Insufficient documentation

## 2014-04-12 DIAGNOSIS — Z598 Other problems related to housing and economic circumstances: Secondary | ICD-10-CM | POA: Insufficient documentation

## 2014-04-12 DIAGNOSIS — Z5987 Material hardship due to limited financial resources, not elsewhere classified: Secondary | ICD-10-CM | POA: Insufficient documentation

## 2014-04-12 DIAGNOSIS — K219 Gastro-esophageal reflux disease without esophagitis: Secondary | ICD-10-CM | POA: Insufficient documentation

## 2014-04-12 DIAGNOSIS — F339 Major depressive disorder, recurrent, unspecified: Secondary | ICD-10-CM

## 2014-04-12 DIAGNOSIS — IMO0001 Reserved for inherently not codable concepts without codable children: Secondary | ICD-10-CM | POA: Insufficient documentation

## 2014-04-12 DIAGNOSIS — F332 Major depressive disorder, recurrent severe without psychotic features: Secondary | ICD-10-CM | POA: Insufficient documentation

## 2014-04-12 DIAGNOSIS — K589 Irritable bowel syndrome without diarrhea: Secondary | ICD-10-CM | POA: Insufficient documentation

## 2014-04-12 NOTE — Progress Notes (Signed)
Patient Discharge Instructions:  After Visit Summary (AVS):   Faxed to:  04/12/14 Discharge Summary Note:   Faxed to:  04/12/14 Psychiatric Admission Assessment Note:   Faxed to:  04/12/14 Suicide Risk Assessment - Discharge Assessment:   Faxed to:  04/12/14 Faxed/Sent to the Next Level Care provider:  04/12/14 Next Level Care Provider Has Access to the EMR, 04/12/14 Faxed to [facility] @ [phone] Records provided to [facility] via [facility]/Epic access.  Patsey Berthold, 04/12/2014, 2:02 PM
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
## 2014-04-12 NOTE — Progress Notes (Signed)
    Daily Group Progress Note  Program: IOP  Group Time: 9:00-10:30  Participation Level: Active  Behavioral Response: Appropriate  Type of Therapy:  Group Therapy  Summary of Progress: Pt. Met with psychiatrist and case manager.     Group Time: 10:30-12:00  Participation Level:  Active  Behavioral Response: Appropriate  Type of Therapy: Group Therapy  Summary of Progress: Pt. Met with psychiatrist and case manager.  [facility]
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
## 2014-04-15 ENCOUNTER — Other Ambulatory Visit (HOSPITAL_COMMUNITY): Payer: 59

## 2014-04-16 ENCOUNTER — Other Ambulatory Visit (HOSPITAL_COMMUNITY): Payer: 59

## 2014-04-17 ENCOUNTER — Other Ambulatory Visit (HOSPITAL_COMMUNITY): Payer: 59

## 2014-04-18 ENCOUNTER — Other Ambulatory Visit (HOSPITAL_COMMUNITY): Payer: 59

## 2014-04-19 ENCOUNTER — Other Ambulatory Visit (HOSPITAL_COMMUNITY): Payer: 59

## 2014-04-22 ENCOUNTER — Other Ambulatory Visit (HOSPITAL_COMMUNITY): Payer: 59

## 2014-04-23 ENCOUNTER — Other Ambulatory Visit (HOSPITAL_COMMUNITY): Payer: 59

## 2014-04-24 ENCOUNTER — Other Ambulatory Visit (HOSPITAL_COMMUNITY): Payer: 59

## 2014-04-25 ENCOUNTER — Other Ambulatory Visit (HOSPITAL_COMMUNITY): Payer: 59

## 2014-04-26 ENCOUNTER — Encounter: Payer: Self-pay | Admitting: Medical

## 2014-04-26 ENCOUNTER — Ambulatory Visit (INDEPENDENT_AMBULATORY_CARE_PROVIDER_SITE_OTHER): Payer: 59 | Admitting: Medical

## 2014-04-26 ENCOUNTER — Other Ambulatory Visit (HOSPITAL_COMMUNITY): Payer: 59

## 2014-04-26 VITALS — BP 122/80 | HR 102 | Temp 97.9°F | Resp 16 | Wt 187.0 lb

## 2014-04-26 DIAGNOSIS — Z23 Encounter for immunization: Secondary | ICD-10-CM

## 2014-04-26 DIAGNOSIS — M797 Fibromyalgia: Secondary | ICD-10-CM

## 2014-04-26 DIAGNOSIS — M25511 Pain in right shoulder: Secondary | ICD-10-CM

## 2014-04-26 DIAGNOSIS — M25519 Pain in unspecified shoulder: Secondary | ICD-10-CM

## 2014-04-26 DIAGNOSIS — IMO0001 Reserved for inherently not codable concepts without codable children: Secondary | ICD-10-CM

## 2014-04-26 DIAGNOSIS — M25561 Pain in right knee: Secondary | ICD-10-CM

## 2014-04-26 DIAGNOSIS — M25569 Pain in unspecified knee: Secondary | ICD-10-CM

## 2014-04-26 NOTE — Progress Notes (Signed)
   Subjective:   Carl Hughes is a 23 y.o. male presenting on 04/26/2014 with concerns.  Here with mother.   He reports some pain in right shoulder, feels a catch, is painful at times.  Gets to a certain spot is very painful   Felt this x for several weeks now.   Denies fall or injury.  No swelling.  No numbness, tingling, weakness of arm.   No new neck issues.   Left shoulder is fine.    Having right knee pains, intermittent, sometimes feels like leg is giving way.   Hurts to go up and down stairs.  Recently has lost a lot of weight.   Was inpatient at [facility] recently for 1 wk for mental health crisis.    Fibromyalgia- compliant with Cymbalta 60mg  BID, flexeril prn.  Also here for second HPV vaccine today.  No other complaint.  Review of Systems ROS as in subjective      Objective:    Filed Vitals:   04/26/14 1231  BP: 122/80  Pulse: 102  Temp: 97.9 F (36.6 C)  Resp: 16    General appearance: alert, no distress, WD/WN Neck: supple, no lymphadenopathy, no thyromegaly, no masses, normal ROM, nontender MSK: mild pop noise in right shoulder with overhead flexion, but normal ROM, nontender to palpation, no impingement, normal passive and active ROM, no deformity.  Normal UE exam otherwise.  right knee with mild pain with flexion, but no laxity, no deformity, nontender, normal ROM, rest of leg exam unremarkable Back: mid and upper paraspinal muscles tender Pulses: 2+ symmetric, upper and lower extremities, normal cap refill Ext: no edema     Assessment: Encounter Diagnoses  Name Primary?  . Shoulder pain, acute, right Yes  . Knee pain, right   . Need for HPV vaccination   . Fibromyalgia     Plan: Shoulder pain, knee pain - exam mostly unremarkable.   Possible inflammation vs bursitis likely.     Use ice prn.  Gave several home therapy exercises to use daily.  Consider beginning Glucosamine/Chondroitin or Fish Oil daily.  Recheck 44mo  fibromyalgia  - c/t Cymbalta BID, flexeril prn.    HPV vaccine #2 given after consent.  F/u 4-6 mo for final HPV#3.   Carl Hughes was seen today for shoulder pain.  Diagnoses and associated orders for this visit:  Shoulder pain, acute, right  Knee pain, right  Need for HPV vaccination - HPV vaccine quadravalent 3 dose IM  Fibromyalgia    Return in about 1 month (around 05/26/2014).

## 2014-04-26 NOTE — Patient Instructions (Signed)
Consider taking Glucosamine Ch[place]on OR Fish Oil daily supplement Use Ibpuprofen as needed for joint pains    Shoulder Exercises EXERCISES  RANGE OF MOTION (ROM) AND STRETCHING EXERCISES These exercises may help you when beginning to rehabilitate your injury. Your symptoms may resolve with or without further involvement from your physician, physical therapist or athletic trainer. While completing these exercises, remember:   Restoring tissue flexibility helps normal motion to return to the joints. This allows healthier, less painful movement and activity.  An effective stretch should be held for at least 30 seconds.  A stretch should never be painful. You should only feel a gentle lengthening or release in the stretched tissue. ROM - Pendulum  Bend at the waist so that your right / left arm falls away from your body. Support yourself with your opposite hand on a solid surface, such as a table or a countertop.  Your right / left arm should be perpendicular to the ground. If it is not perpendicular, you need to lean over farther. Relax the muscles in your right / left arm and shoulder as much as possible.  Gently sway your hips and trunk so they move your right / left arm without any use of your right / left shoulder muscles.  Progress your movements so that your right / left arm moves side to side, then forward and backward, and finally, both clockwise and counterclockwise.  Complete __________ repetitions in each direction. Many people use this exercise to relieve discomfort in their shoulder as well as to gain range of motion. Repeat __________ times. Complete this exercise __________ times per day. STRETCH - Flexion, Standing  Stand with good posture. With an underhand grip on your right / left hand and an overhand grip on the opposite hand, grasp a broomstick or cane so that your hands are a little more than shoulder-width apart.  Keeping your right / left elbow straight and  shoulder muscles relaxed, push the stick with your opposite hand to raise your right / left arm in front of your body and then overhead. Raise your arm until you feel a stretch in your right / left shoulder, but before you have increased shoulder pain.  Try to avoid shrugging your right / left shoulder as your arm rises by keeping your shoulder blade tucked down and toward your mid-back spine. Hold __________ seconds.  Slowly return to the starting position. Repeat __________ times. Complete this exercise __________ times per day. STRETCH - Internal Rotation  Place your right / left hand behind your back, palm-up.  Throw a towel or belt over your opposite shoulder. Grasp the towel/belt with your right / left hand.  While keeping an upright posture, gently pull up on the towel/belt until you feel a stretch in the front of your right / left shoulder.  Avoid shrugging your right / left shoulder as your arm rises by keeping your shoulder blade tucked down and toward your mid-back spine.  Hold __________. Release the stretch by lowering your opposite hand. Repeat __________ times. Complete this exercise __________ times per day. STRETCH - External Rotation and Abduction  Stagger your stance through a doorframe. It does not matter which foot is forward.  As instructed by your physician, physical therapist or athletic trainer, place your hands:  And forearms above your head and on the door frame.  And forearms at head-height and on the door frame.  At elbow-height and on the door frame.  Keeping your head and chest upright and your  stomach muscles tight to prevent over-extending your low-back, slowly shift your weight onto your front foot until you feel a stretch across your chest and/or in the front of your shoulders.  Hold __________ seconds. Shift your weight to your back foot to release the stretch. Repeat __________ times. Complete this stretch __________ times per day.  STRENGTHENING  EXERCISES  These exercises may help you when beginning to rehabilitate your injury. They may resolve your symptoms with or without further involvement from your physician, physical therapist or athletic trainer. While completing these exercises, remember:   Muscles can gain both the endurance and the strength needed for everyday activities through controlled exercises.  Complete these exercises as instructed by your physician, physical therapist or athletic trainer. Progress the resistance and repetitions only as guided.  You may experience muscle soreness or fatigue, but the pain or discomfort you are trying to eliminate should never worsen during these exercises. If this pain does worsen, stop and make certain you are following the directions exactly. If the pain is still present after adjustments, discontinue the exercise until you can discuss the trouble with your clinician.  If advised by your physician, during your recovery, avoid activity or exercises which involve actions that place your right / left hand or elbow above your head or behind your back or head. These positions stress the tissues which are trying to heal. STRENGTH - Scapular Depression and Adduction  With good posture, sit on a firm chair. Supported your arms in front of you with pillows, arm rests or a table top. Have your elbows in line with the sides of your body.  Gently draw your shoulder blades down and toward your mid-back spine. Gradually increase the tension without tensing the muscles along the top of your shoulders and the back of your neck.  Hold for __________ seconds. Slowly release the tension and relax your muscles completely before completing the next repetition.  After you have practiced this exercise, remove the arm support and complete it in standing as well as sitting. Repeat __________ times. Complete this exercise __________ times per day.  STRENGTH - External Rotators  Secure a rubber exercise  band/tubing to a fixed object so that it is at the same height as your right / left elbow when you are standing or sitting on a firm surface.  Stand or sit so that the secured exercise band/tubing is at your side that is not injured.  Bend your elbow 90 degrees. Place a folded towel or small pillow under your right / left arm so that your elbow is a few inches away from your side.  Keeping the tension on the exercise band/tubing, pull it away from your body, as if pivoting on your elbow. Be sure to keep your body steady so that the movement is only coming from your shoulder rotating.  Hold __________ seconds. Release the tension in a controlled manner as you return to the starting position. Repeat __________ times. Complete this exercise __________ times per day.  STRENGTH - Supraspinatus  Stand or sit with good posture. Grasp a __________ weight or an exercise band/tubing so that your hand is "thumbs-up," like when you shake hands.  Slowly lift your right / left hand from your thigh into the air, traveling about 30 degrees from straight out at your side. Lift your hand to shoulder height or as far as you can without increasing any shoulder pain. Initially, many people do not lift their hands above shoulder height.  Avoid shrugging  your right / left shoulder as your arm rises by keeping your shoulder blade tucked down and toward your mid-back spine.  Hold for __________ seconds. Control the descent of your hand as you slowly return to your starting position. Repeat __________ times. Complete this exercise __________ times per day.  STRENGTH - Shoulder Extensors  Secure a rubber exercise band/tubing so that it is at the height of your shoulders when you are either standing or sitting on a firm arm-less chair.  With a thumbs-up grip, grasp an end of the band/tubing in each hand. Straighten your elbows and lift your hands straight in front of you at shoulder height. Step back away from the  secured end of band/tubing until it becomes tense.  Squeezing your shoulder blades together, pull your hands down to the sides of your thighs. Do not allow your hands to go behind you.  Hold for __________ seconds. Slowly ease the tension on the band/tubing as you reverse the directions and return to the starting position. Repeat __________ times. Complete this exercise __________ times per day.  STRENGTH - Scapular Retractors  Secure a rubber exercise band/tubing so that it is at the height of your shoulders when you are either standing or sitting on a firm arm-less chair.  With a palm-down grip, grasp an end of the band/tubing in each hand. Straighten your elbows and lift your hands straight in front of you at shoulder height. Step back away from the secured end of band/tubing until it becomes tense.  Squeezing your shoulder blades together, draw your elbows back as you bend them. Keep your upper arm lifted away from your body throughout the exercise.  Hold __________ seconds. Slowly ease the tension on the band/tubing as you reverse the directions and return to the starting position. Repeat __________ times. Complete this exercise __________ times per day. STRENGTH - Scapular Depressors  Find a sturdy chair without wheels, such as a from a dining room table.  Keeping your feet on the floor, lift your bottom from the seat and lock your elbows.  Keeping your elbows straight, allow gravity to pull your body weight down. Your shoulders will rise toward your ears.  Raise your body against gravity by drawing your shoulder blades down your back, shortening the distance between your shoulders and ears. Although your feet should always maintain contact with the floor, your feet should progressively support less body weight as you get stronger.  Hold __________ seconds. In a controlled and slow manner, lower your body weight to begin the next repetition. Repeat __________ times. Complete this  exercise __________ times per day.  Document Released: 09/08/2005 Document Revised: 01/17/2012 Document Reviewed: 02/06/2009 [facility] Patient Information 2015 [place], [place]. This information is not intended to replace advice given to you by your health care provider. Make sure you discuss any questions you have with your health care provider.    Knee Exercises EXERCISES RANGE OF MOTION(ROM) AND STRETCHING EXERCISES These exercises may help you when beginning to rehabilitate your injury. Your symptoms may resolve with or without further involvement from your physician, physical therapist or athletic trainer. While completing these exercises, remember:   Restoring tissue flexibility helps normal motion to return to the joints. This allows healthier, less painful movement and activity.  An effective stretch should be held for at least 30 seconds.  A stretch should never be painful. You should only feel a gentle lengthening or release in the stretched tissue. STRETCH - Knee Extension, Prone  Lie on your stomach  on a firm surface, such as a bed or countertop. Place your right / left knee and leg just beyond the edge of the surface. You may wish to place a towel under the far end of your right / left thigh for comfort.  Relax your leg muscles and allow gravity to straighten your knee. Your clinician may advise you to add an ankle weight if more resistance is helpful for you.  You should feel a stretch in the back of your right / left knee. Hold this position for __________ seconds. Repeat __________ times. Complete this stretch __________ times per day. * Your physician, physical therapist or athletic trainer may ask you to add ankle weight to enhance your stretch.  RANGE OF MOTION - Knee Flexion, Active  Lie on your back with both knees straight. (If this causes back discomfort, bend your opposite knee, placing your foot flat on the floor.)  Slowly slide your heel back toward your buttocks  until you feel a gentle stretch in the front of your knee or thigh.  Hold for __________ seconds. Slowly slide your heel back to the starting position. Repeat __________ times. Complete this exercise __________ times per day.  STRETCH - Quadriceps, Prone   Lie on your stomach on a firm surface, such as a bed or padded floor.  Bend your right / left knee and grasp your ankle. If you are unable to reach, your ankle or pant leg, use a belt around your foot to lengthen your reach.  Gently pull your heel toward your buttocks. Your knee should not slide out to the side. You should feel a stretch in the front of your thigh and/or knee.  Hold this position for __________ seconds. Repeat __________ times. Complete this stretch __________ times per day.  STRETCH - Hamstrings, Supine   Lie on your back. Loop a belt or towel over the ball of your right / left foot.  Straighten your right / left knee and slowly pull on the belt to raise your leg. Do not allow the right / left knee to bend. Keep your opposite leg flat on the floor.  Raise the leg until you feel a gentle stretch behind your right / left knee or thigh. Hold this position for __________ seconds. Repeat __________ times. Complete this stretch __________ times per day.  STRENGTHENING EXERCISES These exercises may help you when beginning to rehabilitate your injury. They may resolve your symptoms with or without further involvement from your physician, physical therapist or athletic trainer. While completing these exercises, remember:   Muscles can gain both the endurance and the strength needed for everyday activities through controlled exercises.  Complete these exercises as instructed by your physician, physical therapist or athletic trainer. Progress the resistance and repetitions only as guided.  You may experience muscle soreness or fatigue, but the pain or discomfort you are trying to eliminate should never worsen during these  exercises. If this pain does worsen, stop and make certain you are following the directions exactly. If the pain is still present after adjustments, discontinue the exercise until you can discuss the trouble with your clinician. STRENGTH - Quadriceps, Isometrics  Lie on your back with your right / left leg extended and your opposite knee bent.  Gradually tense the muscles in the front of your right / left thigh. You should see either your knee cap slide up toward your hip or increased dimpling just above the knee. This motion will push the back of the knee down  toward the floor/mat/bed on which you are lying.  Hold the muscle as tight as you can without increasing your pain for __________ seconds.  Relax the muscles slowly and completely in between each repetition. Repeat __________ times. Complete this exercise __________ times per day.  STRENGTH - Quadriceps, Short Arcs   Lie on your back. Place a __________ inch towel roll under your knee so that the knee slightly bends.  Raise only your lower leg by tightening the muscles in the front of your thigh. Do not allow your thigh to rise.  Hold this position for __________ seconds. Repeat __________ times. Complete this exercise __________ times per day.  OPTIONAL ANKLE WEIGHTS: Begin with ____________________, but DO NOT exceed ____________________. Increase in 1 pound/0.5 kilogram increments.  STRENGTH - Quadriceps, Straight Leg Raises  Quality counts! Watch for signs that the quadriceps muscle is working to insure you are strengthening the correct muscles and not "cheating" by substituting with healthier muscles.  Lay on your back with your right / left leg extended and your opposite knee bent.  Tense the muscles in the front of your right / left thigh. You should see either your knee cap slide up or increased dimpling just above the knee. Your thigh may even quiver.  Tighten these muscles even more and raise your leg 4 to 6 inches off the  floor. Hold for __________ seconds.  Keeping these muscles tense, lower your leg.  Relax the muscles slowly and completely in between each repetition. Repeat __________ times. Complete this exercise __________ times per day.  STRENGTH - Hamstring, Curls  Lay on your stomach with your legs extended. (If you lay on a bed, your feet may hang over the edge.)  Tighten the muscles in the back of your thigh to bend your right / left knee up to 90 degrees. Keep your hips flat on the bed/floor.  Hold this position for __________ seconds.  Slowly lower your leg back to the starting position. Repeat __________ times. Complete this exercise __________ times per day.  OPTIONAL ANKLE WEIGHTS: Begin with ____________________, but DO NOT exceed ____________________. Increase in 1 pound/0.5 kilogram increments.  STRENGTH - Quadriceps, Squats  Stand in a door frame so that your feet and knees are in line with the frame.  Use your hands for balance, not support, on the frame.  Slowly lower your weight, bending at the hips and knees. Keep your lower legs upright so that they are parallel with the door frame. Squat only within the range that does not increase your knee pain. Never let your hips drop below your knees.  Slowly return upright, pushing with your legs, not pulling with your hands. Repeat __________ times. Complete this exercise __________ times per day.  STRENGTH - Quadriceps, Wall Slides  Follow guidelines for form closely. Increased knee pain often results from poorly placed feet or knees.  Lean against a smooth wall or door and walk your feet out 18-24 inches. Place your feet hip-width apart.  Slowly slide down the wall or door until your knees bend __________ degrees.* Keep your knees over your heels, not your toes, and in line with your hips, not falling to either side.  Hold for __________ seconds. Stand up to rest for __________ seconds in between each repetition. Repeat __________  times. Complete this exercise __________ times per day. * Your physician, physical therapist or athletic trainer will alter this angle based on your symptoms and progress. Document Released: 09/08/2005 Document Revised: 01/17/2012 Document Reviewed: 02/06/2009  [employer] Patient Information 2015 [employer]. This information is not intended to replace advice given to you by your health care provider. Make sure you discuss any questions you have with your health care provider.  

## 2014-04-29 ENCOUNTER — Other Ambulatory Visit (HOSPITAL_COMMUNITY): Payer: 59

## 2014-04-30 ENCOUNTER — Other Ambulatory Visit (HOSPITAL_COMMUNITY): Payer: 59

## 2014-04-30 ENCOUNTER — Telehealth: Payer: Self-pay | Admitting: Medical

## 2014-04-30 ENCOUNTER — Other Ambulatory Visit: Payer: Self-pay | Admitting: Medical

## 2014-04-30 NOTE — Telephone Encounter (Signed)
Carl Hughes [facility] sent both refills to his pharmacy. CLS

## 2014-04-30 NOTE — Telephone Encounter (Signed)
pls refill both

## 2014-04-30 NOTE — Telephone Encounter (Signed)
Is this okay to refill? 

## 2014-04-30 NOTE — Telephone Encounter (Signed)
i sent the medication

## 2014-04-30 NOTE — Telephone Encounter (Signed)
done

## 2014-05-01 ENCOUNTER — Other Ambulatory Visit (HOSPITAL_COMMUNITY): Payer: 59

## 2014-05-02 ENCOUNTER — Other Ambulatory Visit (HOSPITAL_COMMUNITY): Payer: 59

## 2014-05-03 ENCOUNTER — Other Ambulatory Visit (HOSPITAL_COMMUNITY): Payer: 59

## 2014-05-06 ENCOUNTER — Other Ambulatory Visit (HOSPITAL_COMMUNITY): Payer: 59

## 2014-05-06 ENCOUNTER — Other Ambulatory Visit: Payer: Self-pay | Admitting: Family Medicine

## 2014-05-06 MED ORDER — DEXLANSOPRAZOLE 60 MG PO CPDR: 120.0000 mg | DELAYED_RELEASE_CAPSULE | Freq: Every morning | ORAL | Status: DC

## 2014-05-07 ENCOUNTER — Other Ambulatory Visit (HOSPITAL_COMMUNITY): Payer: 59

## 2014-05-08 ENCOUNTER — Other Ambulatory Visit (HOSPITAL_COMMUNITY): Payer: 59

## 2014-05-08 ENCOUNTER — Telehealth: Payer: Self-pay | Admitting: Internal Medicine

## 2014-05-08 NOTE — Telephone Encounter (Signed)
Faxed over medical records to [facility] @ [phone] 

## 2014-05-09 ENCOUNTER — Ambulatory Visit (INDEPENDENT_AMBULATORY_CARE_PROVIDER_SITE_OTHER): Payer: 59 | Admitting: Medical

## 2014-05-09 ENCOUNTER — Encounter: Payer: Self-pay | Admitting: Medical

## 2014-05-09 ENCOUNTER — Other Ambulatory Visit (HOSPITAL_COMMUNITY): Payer: 59

## 2014-05-09 VITALS — BP 118/70 | HR 76 | Temp 97.8°F | Resp 16 | Wt 184.0 lb

## 2014-05-09 DIAGNOSIS — R3915 Urgency of urination: Secondary | ICD-10-CM

## 2014-05-09 LAB — POCT URINALYSIS DIPSTICK
Bilirubin, UA: NEGATIVE
Blood, UA: NEGATIVE
Glucose, UA: NEGATIVE
KETONES UA: NEGATIVE
LEUKOCYTES UA: NEGATIVE
Nitrite, UA: NEGATIVE
Protein, UA: NEGATIVE
Spec Grav, UA: 1.01
UROBILINOGEN UA: NEGATIVE
pH, UA: 7

## 2014-05-09 NOTE — Progress Notes (Signed)
Subjective: Here for urinary issues.   The last few days having some urinary urgency.  Will urinate, and by the time he finishes washing his hands, feels like he has to go again and again.  This can be intermittent for 30 minutes.   Tip of penis some stinging.  No penile discharge, no testicle pain, no perineal or rectal pain.  No rash. Not much caffeine lately.  Drinks a lot of water.  Drinks 6+ beers nightly.  Has had 1 sexual partner in last few weeks, unprotected.  No stream changes.  No other aggravating or relieving factors.  No other c/o.   ROS as in subjective   Objective: Filed Vitals:   05/09/14 1345  BP: 118/70  Pulse: 76  Temp: 97.8 F (36.6 C)  Resp: 16    General appearance: alert, no distress, WD/WN  Abdomen: +bs, soft, non tender, non distended, no masses, no hepatomegaly, no splenomegaly GU: normal male external genitalia, no rash or erythema, no scrotal tenderness, no mass, but there is some shoddy tender right inguinal nodes    Assessment: Encounter Diagnosis  Name Primary?  . Urinary urgency Yes     Plan: Labs for GC/Chlamydia, urine culture. UA unremarkable.  discussed possible causes.   C/t good hydration, advised he cut back on alcohol significantly.  Limit caffeine, use condoms.

## 2014-05-09 NOTE — Addendum Note (Signed)
Addended by: Louie Bun on: 05/09/2014 02:23 PM   Modules accepted: Orders

## 2014-05-10 LAB — GC/CHLAMYDIA PROBE AMP
CT Probe RNA: NEGATIVE
GC PROBE AMP APTIMA: NEGATIVE

## 2014-05-11 LAB — URINE CULTURE
Colony Count: NO GROWTH
Organism ID, Bacteria: NO GROWTH

## 2014-05-13 ENCOUNTER — Other Ambulatory Visit (HOSPITAL_COMMUNITY): Payer: 59

## 2014-05-14 ENCOUNTER — Other Ambulatory Visit (HOSPITAL_COMMUNITY): Payer: 59

## 2014-05-15 ENCOUNTER — Other Ambulatory Visit (HOSPITAL_COMMUNITY): Payer: 59

## 2014-05-16 ENCOUNTER — Other Ambulatory Visit (HOSPITAL_COMMUNITY): Payer: 59

## 2014-05-17 ENCOUNTER — Other Ambulatory Visit (HOSPITAL_COMMUNITY): Payer: 59

## 2014-05-20 ENCOUNTER — Other Ambulatory Visit (HOSPITAL_COMMUNITY): Payer: 59

## 2014-05-21 ENCOUNTER — Other Ambulatory Visit (HOSPITAL_COMMUNITY): Payer: 59

## 2014-05-22 ENCOUNTER — Other Ambulatory Visit (HOSPITAL_COMMUNITY): Payer: 59

## 2014-05-23 ENCOUNTER — Other Ambulatory Visit (HOSPITAL_COMMUNITY): Payer: 59

## 2014-06-06 ENCOUNTER — Ambulatory Visit: Payer: 59 | Admitting: Medical

## 2014-06-07 ENCOUNTER — Encounter: Payer: Self-pay | Admitting: Medical

## 2014-06-07 ENCOUNTER — Ambulatory Visit (INDEPENDENT_AMBULATORY_CARE_PROVIDER_SITE_OTHER): Payer: 59 | Admitting: Medical

## 2014-06-07 VITALS — BP 120/80 | HR 94 | Temp 97.8°F | Resp 16 | Wt 186.0 lb

## 2014-06-07 DIAGNOSIS — J069 Acute upper respiratory infection, unspecified: Secondary | ICD-10-CM

## 2014-06-07 DIAGNOSIS — Z202 Contact with and (suspected) exposure to infections with a predominantly sexual mode of transmission: Secondary | ICD-10-CM

## 2014-06-07 DIAGNOSIS — J029 Acute pharyngitis, unspecified: Secondary | ICD-10-CM

## 2014-06-07 DIAGNOSIS — Z2089 Contact with and (suspected) exposure to other communicable diseases: Secondary | ICD-10-CM

## 2014-06-07 DIAGNOSIS — Z207 Contact with and (suspected) exposure to pediculosis, acariasis and other infestations: Secondary | ICD-10-CM

## 2014-06-07 LAB — POCT RAPID STREP A (OFFICE): RAPID STREP A SCREEN: NEGATIVE

## 2014-06-07 MED ORDER — PERMETHRIN 5 % EX CREA: 1.0000 "application " | TOPICAL_CREAM | Freq: Once | CUTANEOUS | Status: DC

## 2014-06-07 NOTE — Progress Notes (Signed)
Subjective:  Carl Hughes is a 23 y.o. male who presents for concerns.    Here for 3 days of cough, sore throat, feeling a little bad, headache, but denies fever, SOB, wheezing, NVD.  Using nothing for symptoms.   His new girlfriend who has been sleeping in his bed just got diagnosed with scabies and he has several spots on his chest.   Wants STD testing.  New sexual partner since 65mo ago, no rash, but wants to check things out.   The following portions of the patient's history were reviewed and updated as appropriate: allergies, current medications, past family history, past medical history, past social history, past surgical history and problem list.  ROS as in subjective  Past Medical History  Diagnosis Date  . Anxiety   . Depression   . GERD (gastroesophageal reflux disease)   . Tobacco use disorder   . Gallbladder polyp   . IBS (irritable bowel syndrome)   . Chronic back pain   . Exercise-induced asthma   . Chronic back pain   . Polyarthralgia   . Bipolar disorder     Dr. Pearson Grippe, [facility]  . Wears glasses      Objective: BP 120/80  Pulse 94  Temp(Src) 97.8 F (36.6 C) (Oral)  Resp 16  Wt 186 lb (84.369 kg)   General appearance: Alert, WD/WN, no distress,mildly ill appearing                             Skin: warm, no diaphoresis, upper chest with several small erythematous 2-3 mm papules                           Head: no sinus tenderness                            Eyes: conjunctiva normal, corneas clear, PERRLA                            Ears: pearly TMs, external ear canals normal                          Nose: septum midline, turbinates swollen, with erythema and clear discharge             Mouth/throat: MMM, tongue normal, mild pharyngeal erythema                           Neck: supple, no adenopathy, no thyromegaly, nontender                          Heart: RRR, normal S1, S2, no murmurs                         Lungs: clear, no  rhonchi, no wheezes, no rales                Extremities: no edema, nontender     Assessment: Encounter Diagnoses  Name Primary?  . Venereal disease contact Yes  . Sore throat   . Scabies exposure   . Upper respiratory infection      Plan:  1. Venereal disease contact Discussed safe sex, prevention, general frequency of STD testing/screening.  discused STDs, means of transmission.  Labs today. - GC/Chlamydia Probe Amp - HIV antibody - RPR  2. Sore throat Strep negative.  Discussed supportive care, call if worse/not imorpving in the next few days.  3. Scabies exposure Discussed diagnosis of scabies, prevention, treatment, general hygiene measures.  - permethrin (ELIMITE) 5 % cream; Apply 1 application topically once.  Dispense: 60 g; Refill: 0  4. Upper respiratory infection Strep negative.  Discussed supportive care, call if worse/not improving in the next few days.

## 2014-06-08 LAB — HIV ANTIBODY (ROUTINE TESTING W REFLEX): HIV 1&2 Ab, 4th Generation: NONREACTIVE

## 2014-06-08 LAB — RPR

## 2014-06-08 LAB — GC/CHLAMYDIA PROBE AMP
CT Probe RNA: NEGATIVE
GC Probe RNA: NEGATIVE

## 2014-06-11 ENCOUNTER — Telehealth: Payer: Self-pay | Admitting: Family Medicine

## 2014-06-11 ENCOUNTER — Other Ambulatory Visit: Payer: Self-pay | Admitting: Medical

## 2014-06-11 MED ORDER — CYCLOBENZAPRINE HCL 10 MG PO TABS: 10.0000 mg | ORAL_TABLET | ORAL | Status: DC

## 2014-06-11 NOTE — Telephone Encounter (Signed)
Mom called and asked for Flexeril refill to [facility] out pt pharm

## 2014-06-12 NOTE — Addendum Note (Signed)
Addended by: Christin Bach L on: 06/12/2014 11:43 AM   Modules accepted: Orders

## 2014-06-14 DIAGNOSIS — Z029 Encounter for administrative examinations, unspecified: Secondary | ICD-10-CM

## 2014-07-09 ENCOUNTER — Other Ambulatory Visit: Payer: Self-pay | Admitting: Medical

## 2014-07-10 NOTE — Telephone Encounter (Signed)
Is this okay to refill? 

## 2014-07-10 NOTE — Telephone Encounter (Signed)
Is this okay to refill? He's requesting a 90 day supply

## 2014-07-11 NOTE — Telephone Encounter (Signed)
Called Lidoderm 5% patch #90 [id number] to apply to skin daily and remove within 12 hours [place] per Audelia Acton.

## 2014-07-11 NOTE — Telephone Encounter (Signed)
Ok to RF? 

## 2014-07-11 NOTE — Telephone Encounter (Signed)
pls call out #90, 90 day supply of Lidoderm patch.

## 2014-07-11 NOTE — Telephone Encounter (Signed)
Called rx into [facility] pharmacy.

## 2014-07-18 ENCOUNTER — Ambulatory Visit (INDEPENDENT_AMBULATORY_CARE_PROVIDER_SITE_OTHER): Payer: 59 | Admitting: Medical

## 2014-07-18 ENCOUNTER — Encounter: Payer: Self-pay | Admitting: Medical

## 2014-07-18 VITALS — BP 128/88 | HR 102 | Temp 98.0°F | Resp 15 | Wt 180.0 lb

## 2014-07-18 DIAGNOSIS — L918 Other hypertrophic disorders of the skin: Secondary | ICD-10-CM

## 2014-07-18 DIAGNOSIS — L919 Hypertrophic disorder of the skin, unspecified: Secondary | ICD-10-CM

## 2014-07-18 DIAGNOSIS — L909 Atrophic disorder of skin, unspecified: Secondary | ICD-10-CM

## 2014-07-18 DIAGNOSIS — R37 Sexual dysfunction, unspecified: Secondary | ICD-10-CM

## 2014-07-18 DIAGNOSIS — L708 Other acne: Secondary | ICD-10-CM

## 2014-07-18 DIAGNOSIS — F1010 Alcohol abuse, uncomplicated: Secondary | ICD-10-CM

## 2014-07-18 DIAGNOSIS — G4700 Insomnia, unspecified: Secondary | ICD-10-CM

## 2014-07-18 DIAGNOSIS — L709 Acne, unspecified: Secondary | ICD-10-CM

## 2014-07-18 DIAGNOSIS — F529 Unspecified sexual dysfunction not due to a substance or known physiological condition: Secondary | ICD-10-CM

## 2014-07-18 MED ORDER — CLONIDINE HCL 0.1 MG PO TABS: 0.1000 mg | ORAL_TABLET | Freq: Every day | ORAL | Status: DC

## 2014-07-18 NOTE — Progress Notes (Signed)
Subjective: Feels groggy, not sleeping well.  Random problems with sleep.  Some days is fine, otherwise not so good.  Awakening 5 times nightly.  Drinks 6 beers daily, but has been doing this for last several months.  Currently living in various locations.   Sister freaks out with him in the home, so he is staying with friends, other family members.  Sometimes sleeps in people's sheds, sometimes on friend's couches.    Having problems with premature ejaculation.  Not able to satisfy his girlfriend.  Thinks she may end up cheating on him if he can't improve this.    Has skin tags he wants cut off, bilat axilla, neck.   Has acne on shoulders that flares form time to time.    Objective: Gen: wd, wn, nad Skin: 2 small pedunculated skin tags right axillar, 1 very small skin tag of left and right lateral neck, 1 small pedunculated skin tag left axilla Heart: RRR, normal S1, S2, no murmurs Lungs: clear Pulses normal Ext no edema Psych: answers questions appropriately    Assessment: Encounter Diagnoses  Name Primary?  . Insomnia Yes  . Cutaneous skin tags   . Excessive drinking alcohol   . Sexual dysfunction   . Acne, unspecified acne type    Plan: Insomnia - discussed sleep hygiene, cutting back on alcohol gradually, consistent bedtime, not eating or drinking within 2 hours of bedtime, trying to maximize the sleep environment.  Trial of clonidine today which he has taken prior for the same.   Skin tags - reassured they are not worrisome.  Advised that this is a cosmetic procedure, insurance can be billed, by insurance may or may not pay for this.  He declines procedure today.  Excessive alcohol use - discussed this, advised he cut back gradually to the point of no more than 2 days.  He states AA is a cult and won't go to this.   Recommended counseling, advised that his amount of alcohol consumption is a problem that is interfering with his sexual function and sleep.  He doesn't seem to  receptive of my recommendations.  Sexual dysfunction - discussed ways to deal with premature ejaculation, advised he gradually cut down on alcohol, advised that this was more an emotional/psychiatric issue, not a physical problem in his case.   Lets see if clonidine for sleep also helps with his dysfunction in conjunction with less alcohol intake.    Acne - c/t benzoyl peroxide OTC, good hygiene.   F/u 32mo.

## 2014-08-12 ENCOUNTER — Telehealth: Payer: Self-pay | Admitting: Family Medicine

## 2014-08-12 NOTE — Telephone Encounter (Signed)
[facility] out[facility] req refills for Clonidine HCL 0.1 mg tablet # 30

## 2014-08-13 ENCOUNTER — Other Ambulatory Visit: Payer: Self-pay | Admitting: Medical

## 2014-08-13 MED ORDER — CLONIDINE HCL 0.1 MG PO TABS
0.1000 mg | ORAL_TABLET | Freq: Every day | ORAL | Status: DC
Start: 2014-08-13 — End: 2014-08-27

## 2014-08-13 NOTE — Telephone Encounter (Signed)
Done

## 2014-08-27 ENCOUNTER — Other Ambulatory Visit: Payer: Self-pay | Admitting: Medical

## 2014-08-27 ENCOUNTER — Telehealth: Payer: Self-pay | Admitting: Medical

## 2014-08-27 MED ORDER — CLONIDINE HCL 0.2 MG PO TABS: 0.2000 mg | ORAL_TABLET | Freq: Every day | ORAL | Status: DC

## 2014-08-27 NOTE — Telephone Encounter (Signed)
Patients mother is aware 

## 2014-08-27 NOTE — Telephone Encounter (Signed)
Pt states he is having to take 2 pills of Clonidine at night to sleep, so he is now out of his meds.  This does seem to be helping him sleep and wants to continue with 2 a night.  Is this ok?  Please send in refill to [facility]

## 2014-08-27 NOTE — Telephone Encounter (Signed)
I'll resend 0.2mg  Clonidine, which is the same as taking 2 of the 0.1mg  . However, DO NOT take any more than 2 nightly!!!  recheck 58mo.

## 2014-09-02 ENCOUNTER — Telehealth: Payer: Self-pay | Admitting: Medical

## 2014-09-02 NOTE — Telephone Encounter (Signed)
Pt still continues to have chronic constipation & has for years, tried stool softeners, laxatives, Miralax, drinks large amounts of water.  He was on Hycosamine for years for IBS.  Is there anything you can prescribe him to take daily,  Could he try Amitiza?

## 2014-09-02 NOTE — Telephone Encounter (Signed)
Patients mother is aware. CLS

## 2014-09-02 NOTE — Telephone Encounter (Signed)
At this point, I don't think we've discussed constipation, but we have talked about loose stool and IBS.  If not already taking a fiber supplement, I would recommend he begin Metamucil daily OTC.  Set up f/u appt and we can consider Amitiza or similar.

## 2014-09-06 ENCOUNTER — Ambulatory Visit: Payer: 59 | Admitting: Medical

## 2014-09-06 ENCOUNTER — Telehealth: Payer: Self-pay | Admitting: Medical

## 2014-09-06 ENCOUNTER — Encounter: Payer: Self-pay | Admitting: Medical

## 2014-09-06 ENCOUNTER — Ambulatory Visit (INDEPENDENT_AMBULATORY_CARE_PROVIDER_SITE_OTHER): Payer: 59 | Admitting: Medical

## 2014-09-06 VITALS — BP 100/80 | HR 72 | Temp 97.5°F | Resp 16 | Wt 176.0 lb

## 2014-09-06 DIAGNOSIS — D1801 Hemangioma of skin and subcutaneous tissue: Secondary | ICD-10-CM

## 2014-09-06 DIAGNOSIS — M549 Dorsalgia, unspecified: Secondary | ICD-10-CM

## 2014-09-06 DIAGNOSIS — G4700 Insomnia, unspecified: Secondary | ICD-10-CM

## 2014-09-06 DIAGNOSIS — G8929 Other chronic pain: Secondary | ICD-10-CM

## 2014-09-06 DIAGNOSIS — K5900 Constipation, unspecified: Secondary | ICD-10-CM

## 2014-09-06 DIAGNOSIS — K5909 Other constipation: Secondary | ICD-10-CM

## 2014-09-06 DIAGNOSIS — F99 Mental disorder, not otherwise specified: Secondary | ICD-10-CM

## 2014-09-06 DIAGNOSIS — L918 Other hypertrophic disorders of the skin: Secondary | ICD-10-CM

## 2014-09-06 MED ORDER — MUPIROCIN 2 % EX OINT: 1.0000 "application " | TOPICAL_OINTMENT | Freq: Three times a day (TID) | CUTANEOUS | Status: DC

## 2014-09-06 NOTE — Telephone Encounter (Signed)
Refer to [employer] Linzess constipation study.     Please refer to Dr. Clydell Hakim at [facility] for chronic back pain. Send chart ortho records, my records, imaging, labs.

## 2014-09-06 NOTE — Progress Notes (Signed)
Subjective: Here for constipation.  Has dealt with this for years.  Has tried laxatives, stool softeners, enemas, he drinks lots of water in general.  Drinks 3 cokes a day.  Not a lot of cheese or big portions.  No prior coloscopy, but has seen GI/Dr. Collene Mares several years ago.  Hx/O IBS combined type.   Prior has been on hyoscyamine for years, align, metamucil, miralax.   He notes a blood blister on upper back or some ongoing bump that he picks at due to itching.  Wants several skin tags removed we discussed last visit.  Insomnia - doing ok on Clonidine 0.2mg  daily QHS  Here for recheck on chronic back pain.  He has a long hx/o chronic pain.   Has seen orthopedist in the past, various doctors in the past, has been to chiropractor, PT, had EDSI in the past.  Since I have been working with him the last several months, he has continued on Cymbalta BID, Flexeril prn, Lidoderm patch daily, and occasional NSAID, but says this still doesn't help the pain.  Using percocet or Hydrocodone that he buys off the street, and this still doesn't help.    Objective: Filed Vitals:   09/06/14 1043  BP: 100/80  Pulse: 72  Temp: 97.5 F (36.4 C)  Resp: 16    General appearance: alert, no distress, WD/WN Neck: supple, no lymphadenopathy, no thyromegaly, no masses Heart: RRR, normal S1, S2, no murmurs Lungs: CTA bilaterally, no wheezes, rhonchi, or rales Abdomen: +bs, soft, non tender, non distended, no masses, no hepatomegaly, no splenomegaly Back: tender throughout Musculoskeletal: nontender, no swelling, no obvious deformity Extremities: no edema, no cyanosis, no clubbing Pulses: 2+ symmetric, upper and lower extremities, normal cap refill Skin: right upper back with tattoo, small 8mm diameter slightly raised red/purple round lesion suggestive of hemangioma, inflamed.   Right neck with pedunculated 32mm pink lesion c/w skin tag, right axillar with 2 separate 1-2 mm pedunculated raised pink lesions c/w skin  tags.      Assessment: Encounter Diagnoses  Name Primary?  . Chronic back pain Yes  . Chronic constipation   . Hemangioma of skin   . Cutaneous skin tags   . Insomnia   . Mental health disorder    Plan: Chronic back pain - referral to neurology/specialist, or possibly pain management  Chronic constipation-discuss symptoms, risk factors including street use of narcotics, long history of chronic constipation in general, discussed fiber and water intake, over-the-counter remedies, referral to [employer] for Linzess constipation study.  Hemangioma skin-discuss risk and benefits of procedure, he gives consent, cleaned and prepped the area in usual sterile fashion, use local anesthesia 1% lidocaine without epi to the right upper back, use Hyphercator to obliterate the lesion, patient tolerated procedure well covered with sterile dressing, discussed wound care  Skin tags-discuss risk and benefits of procedure, he gives consent, cleaned and prepped the areas in usual sterile fashion, including right lateral neck x 1 tag, right axilla x 2 tags, use ethyl acetate spray for local anesthesia, excised cut lesions with forceps and scissors at the base, patient tolerated procedure well covered with sterile dressing, discussed wound care  insomnia - c/t clonidine 0.2mg  QHS  Mental health disorder - his problems are confounding by his mental health problems.  On cymbalta for pain and depression, has been on numerous other medications before.  Advised to f/u with psychiatry.

## 2014-09-09 NOTE — Telephone Encounter (Signed)
I fax over the referral to [place] for the [place] study. I also fax over the referral to [facility] to Dr. Maryjean Ka for chronic back pain. Dr. Maryjean Ka office will contact the patient for his appointment.

## 2014-09-11 ENCOUNTER — Telehealth: Payer: Self-pay | Admitting: Medical

## 2014-09-11 ENCOUNTER — Other Ambulatory Visit: Payer: Self-pay | Admitting: Medical

## 2014-09-11 MED ORDER — LINACLOTIDE 290 MCG PO CAPS: 290.0000 ug | ORAL_CAPSULE | Freq: Every day | ORAL | Status: DC

## 2014-09-11 NOTE — Telephone Encounter (Signed)
I fax over the referral to [facility]. Waiting on a response from there office.  I gave the patient sample's of Linzess the lower dose and the higher dose.  Patient is aware to follow in 1 month on Constipation.

## 2014-09-11 NOTE — Telephone Encounter (Signed)
Pt doesn't qualify for constipation study per [employer].  Can he try Amitiza or Linzess samples if you have any?

## 2014-09-11 NOTE — Telephone Encounter (Signed)
Refer to pain clinic.  Let him know he doesn't qualify for study, so begin Linzess once daily for constipation.  Recheck 82mo on constipation.

## 2014-12-13 ENCOUNTER — Encounter: Payer: Self-pay | Admitting: Medical

## 2014-12-13 ENCOUNTER — Ambulatory Visit (INDEPENDENT_AMBULATORY_CARE_PROVIDER_SITE_OTHER): Payer: 59 | Admitting: Medical

## 2014-12-13 VITALS — BP 110/80 | HR 69 | Temp 97.9°F | Resp 15 | Wt 170.0 lb

## 2014-12-13 DIAGNOSIS — M79671 Pain in right foot: Secondary | ICD-10-CM

## 2014-12-13 DIAGNOSIS — H9193 Unspecified hearing loss, bilateral: Secondary | ICD-10-CM

## 2014-12-13 DIAGNOSIS — M79672 Pain in left foot: Secondary | ICD-10-CM

## 2014-12-13 NOTE — Progress Notes (Signed)
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 Lately feet hurt all over bilaterally. Denies fall, trauma, swelling, numbness or tingling or weakness. No other prior foot issues no other leg pains. No particular treatment for this.  Thinks he may broke his right second toe a few weeks ago he bumped a door  He says people have to yell at him all at time thinks his hearing is abnormal. Denies ringing. No recent popping, allergy symptoms, head congestion no lightheaded or dizziness  Past Medical History  Diagnosis Date  . Anxiety   . Depression   . GERD (gastroesophageal reflux disease)   . Tobacco use disorder   . Gallbladder polyp   . IBS (irritable bowel syndrome)   . Chronic back pain   . Exercise-induced asthma   . Chronic back pain   . Polyarthralgia   . Bipolar disorder     Dr. Pearson Grippe, [facility]  . Wears glasses    Review of systems as in subjective   Objective: BP 110/80 mmHg  Pulse 69  Temp(Src) 97.9 F (36.6 C) (Oral)  Resp 15  Wt 170 lb (77.111 kg)  Gen: wd, wn, nad Hent: unremarkable, normal TMs, no fluid Neck supple nontender no lymphadenopathy, no mass mild tenderness of right second toe at the PIP, otherwise mild tenderness of the foot generalized bilaterally, no deformity good arch, normal toe and ankle range of motion no swelling no deformity Ext: no edema Feet neurovascularly intact   Assessment: Encounter Diagnoses  Name Primary?  Carl Hughes Foot pain, bilateral Yes  . Hearing decreased, bilateral     Plan Foot pain - based on exam the toe doesn't appear to be fractured, declines x-ray, good range of motion no deformity normal strength of the second right toe.  He has generalized foot pain with mild generalized foot tenderness nonspecific.  Discussed trying different work boots to find a better fit, advise daily tennis ball massage and  stretch of the feet in the mornings, ice and elevation in the evenings. Call or return if this worsens or doesn't improve over the next several weeks  Hearing decreased - screen somewhat abnormal, normal ear exam, discussed wearing hearing protection, avoid loud noises, and if desired we can refer for hearing consultation with the hearing clinic or he can make the appointment himself. He will let me know

## 2015-02-13 ENCOUNTER — Ambulatory Visit (INDEPENDENT_AMBULATORY_CARE_PROVIDER_SITE_OTHER): Payer: 59 | Admitting: Medical

## 2015-02-13 ENCOUNTER — Encounter: Payer: Self-pay | Admitting: Medical

## 2015-02-13 VITALS — BP 110/70 | HR 76 | Temp 98.0°F | Resp 16

## 2015-02-13 DIAGNOSIS — S61219A Laceration without foreign body of unspecified finger without damage to nail, initial encounter: Secondary | ICD-10-CM

## 2015-02-13 NOTE — Progress Notes (Signed)
  History of Present Illness   Carl Hughes is a 24 y.o. male.  Chief Complaint  Patient presents with  . lacerated finger    cut finger on mower blade a few minutes ago, actively bleeding   Patient presents for evaluation of a laceration to the middle, right finger. Injury occurred 10 minutes ago. The mechanism of the wound was a metal edge.  He was taking off a lawn mower blade when the blade slipped cutting his finger. The patient reports no coldness, no numbness,+ pain at the are of wound, no paresthesias. There was not other injuries.  Patient denies numbness, weakness and or other injury. The tetanus status is within past 5 years.  No other aggravating or relieving factors. No other complaint.  Review of systems as in subjective  Physical Exam   BP 110/70 mmHg  Pulse 76  Temp(Src) 98 F (36.7 C) (Oral)  Resp 16  Wt   There is a linear laceration measuring approximately 2 cm in length on the middle,  3rd finger dorsal right phalanx.  Examination of the wound for foreign bodies and devitalized tissue showed none.  Examination of the surrounding area for neural or vascular damage showed none.  Normal extensor and flexor strengh, sensastion of finger normal.  othewrise hands noraml appearing  Assessment and Plan   Encounter Diagnosis  Name Primary?  . Finger laceration, initial encounter Yes    Discussed his wound findings, he used tap water in the office at this time to thoroughly flush and clean out the wound. Discussed laceration repair, risk and benefits, and he consents to procedure.  Right middle finger/third finger, middle phalanx dorsal side with linear wound-cleaned and prepped the area in normal sterile fashion, used local anesthesia with 1% lidocaine without epinephrine, explored the wound, no tendon laceration, otherwise exam as above, placed 3 simple interrupted sutures, 5. 0 nylon. Covered the wound with sterile bandage. Discussed wound care, avoiding contamination to  the wound, discussed f/u, and rehceck in 7-10 days, sooner prn.

## 2015-03-11 ENCOUNTER — Telehealth: Payer: Self-pay | Admitting: Medical

## 2015-03-11 NOTE — Telephone Encounter (Signed)
Pt would like referral to Rheumatologist for chronic back pain & fibromyalgia.  Pt was seen years ago by Dr. Trudie Reed t# [phone] and would like to go back there,  Which ever doctor or P.A. could see him first would be fine. Thanks

## 2015-03-13 NOTE — Telephone Encounter (Signed)
Make referral, send copy of last 1-2 notes pertaining to chronic pains, and last ortho or prior rheum notes in electronic or paper chart

## 2015-03-19 NOTE — Telephone Encounter (Signed)
I fax the patients information over to Dr. Dora Sims Fax # [phone]

## 2015-05-16 ENCOUNTER — Other Ambulatory Visit: Payer: Self-pay | Admitting: Medical

## 2015-05-16 NOTE — Telephone Encounter (Signed)
Is this okay to refill? 

## 2015-07-31 ENCOUNTER — Encounter: Payer: Self-pay | Admitting: Medical

## 2015-07-31 ENCOUNTER — Ambulatory Visit (INDEPENDENT_AMBULATORY_CARE_PROVIDER_SITE_OTHER): Payer: 59 | Admitting: Medical

## 2015-07-31 VITALS — BP 118/80 | HR 80 | Temp 98.2°F | Resp 16 | Wt 154.0 lb

## 2015-07-31 DIAGNOSIS — J309 Allergic rhinitis, unspecified: Secondary | ICD-10-CM

## 2015-07-31 DIAGNOSIS — G4700 Insomnia, unspecified: Secondary | ICD-10-CM | POA: Diagnosis not present

## 2015-07-31 DIAGNOSIS — H9202 Otalgia, left ear: Secondary | ICD-10-CM | POA: Diagnosis not present

## 2015-07-31 DIAGNOSIS — J3489 Other specified disorders of nose and nasal sinuses: Secondary | ICD-10-CM

## 2015-07-31 MED ORDER — AMOXICILLIN 875 MG PO TABS
875.0000 mg | ORAL_TABLET | Freq: Two times a day (BID) | ORAL | Status: DC
Start: 2015-07-31 — End: 2015-10-24

## 2015-07-31 MED ORDER — AMITRIPTYLINE HCL 50 MG PO TABS: 50.0000 mg | ORAL_TABLET | Freq: Every day | ORAL | Status: DC

## 2015-07-31 NOTE — Progress Notes (Signed)
Subjective: Chief Complaint  Patient presents with  . ear pain    left ear pain, no drainage or fever   Here for 4 day hx/o left ear pain, headache, some sinus pressure, scratchy throat.   No fever, no NVD, no chills, no body aches, no cough.  No ear drainage, no recent swimming.  No sick contacts  He reports ongoing issues with insomnia.  Has not tolerated Ambien, clonidine didn't help which is what we tried last.     Past Medical History  Diagnosis Date  . Anxiety   . Depression   . GERD (gastroesophageal reflux disease)   . Tobacco use disorder   . Gallbladder polyp   . IBS (irritable bowel syndrome)   . Chronic back pain   . Exercise-induced asthma   . Chronic back pain   . Polyarthralgia   . Bipolar disorder     Dr. Pearson Grippe, [facility]  . Wears glasses    ROS as in subjective   Objective: BP 118/80 mmHg  Pulse 80  Temp(Src) 98.2 F (36.8 C) (Oral)  Resp 16  Wt 154 lb (69.854 kg)  General appearance: alert, no distress, WD/WN HEENT: normocephalic, sclerae anicteric, conjunctiva pink and moist, TMs pearly, nares patent, no discharge or erythema, pharynx with mild post nasal drainage, otherwise normal, tonsils unremarkable Oral cavity: MMM, no lesions Neck: supple, no lymphadenopathy, no thyromegaly, no masses Heart: RRR, normal S1, S2, no murmurs Lungs: CTA bilaterally, no wheezes, rhonchi, or rales Pulses: 2+ symmetric    Assessment: Encounter Diagnoses  Name Primary?  . Otalgia of left ear Yes  . Sinus pressure   . Allergic rhinitis, unspecified allergic rhinitis type   . Insomnia     Plan: Otalgia, sinus pressure, allergic rhinitis - etiology suggests some allergen triggers, sinus pressure, post nasal drainage.  Begin Benadryl OTC, hydrate well, and if sinus pressure and ear pain much worse within the next week, can begin amoxicillin, but current etiology appears to be allergen trigger  Insomnia - discussed sleep hygiene, prior  medications.   Begin trial of elavil   Markail was seen today for ear pain.  Diagnoses and all orders for this visit:  Otalgia of left ear  Sinus pressure  Allergic rhinitis, unspecified allergic rhinitis type  Insomnia  Other orders -     amitriptyline (ELAVIL) 50 MG tablet; Take 1 tablet (50 mg total) by mouth at bedtime. -     amoxicillin (AMOXIL) 875 MG tablet; Take 1 tablet (875 mg total) by mouth 2 (two) times daily.

## 2015-07-31 NOTE — Addendum Note (Signed)
Addended by: Carlena Hurl on: 07/31/2015 03:23 PM   Modules accepted: Orders, Medications

## 2015-10-22 ENCOUNTER — Ambulatory Visit: Payer: 59 | Admitting: Medical

## 2015-10-24 ENCOUNTER — Encounter: Payer: Self-pay | Admitting: Medical

## 2015-10-24 ENCOUNTER — Ambulatory Visit (INDEPENDENT_AMBULATORY_CARE_PROVIDER_SITE_OTHER): Payer: 59 | Admitting: Medical

## 2015-10-24 VITALS — BP 98/70 | HR 88 | Wt 169.0 lb

## 2015-10-24 DIAGNOSIS — M26629 Arthralgia of temporomandibular joint, unspecified side: Secondary | ICD-10-CM

## 2015-10-24 DIAGNOSIS — G4700 Insomnia, unspecified: Secondary | ICD-10-CM | POA: Diagnosis not present

## 2015-10-24 DIAGNOSIS — R369 Urethral discharge, unspecified: Secondary | ICD-10-CM | POA: Diagnosis not present

## 2015-10-24 DIAGNOSIS — Z202 Contact with and (suspected) exposure to infections with a predominantly sexual mode of transmission: Secondary | ICD-10-CM

## 2015-10-24 LAB — POCT URINALYSIS DIPSTICK
Bilirubin, UA: NEGATIVE
Blood, UA: NEGATIVE
Glucose, UA: NEGATIVE
Ketones, UA: NEGATIVE
Leukocytes, UA: NEGATIVE
Nitrite, UA: NEGATIVE
PH UA: 6
PROTEIN UA: NEGATIVE
UROBILINOGEN UA: NEGATIVE

## 2015-10-24 MED ORDER — SUVOREXANT 15 MG PO TABS: 1.0000 | ORAL_TABLET | Freq: Every evening | ORAL | Status: DC | PRN

## 2015-10-24 NOTE — Progress Notes (Signed)
Subjective: Chief Complaint  Patient presents with  . sleep issues    states that ever since the weather changed he cant sleep at night and naps all day and not by trying to sleep. tmj is acting up   Here for several concerns.    Sleep issues - having problems with sleep for a few months.  Trouble getting to sleep.  Most of the time can stay asleep once he gets to sleep.  Is falling asleep in the day not meaning too, but thinks its because he isn't sleeping well at night.  Has had insomnia issues prior.  Has been on OTC benadryl, tylenol PM, mirtazapine, valium, clonazepam, xanax, amitriptyline, Seroquel.  Doesn't think he snores.  No prior sleep study.    TMJ problems - having pain and popping in left TMJ area.  Been having this lately since its gotten cold but has had this prior.  No URI symptoms.   Wants STD screen.   He denies discharge, but feels sore in the penis.  No soreness or swelling of scrotum.   No rash..  Using condoms sometimes.  Sometimes white soapy looking discharge with intercourse.    Past Medical History  Diagnosis Date  . Anxiety   . Depression   . GERD (gastroesophageal reflux disease)   . Tobacco use disorder   . Gallbladder polyp   . IBS (irritable bowel syndrome)   . Chronic back pain   . Exercise-induced asthma   . Chronic back pain   . Polyarthralgia   . Bipolar disorder ([place])     Dr. Pearson Grippe, [facility]  . Wears glasses    ROS as in subjective  Objective: BP 98/70 mmHg  Pulse 88  Wt 169 lb (76.658 kg)  Gen: wd, wn, nad Heart RRR, normal s1, s2, no murmurs Lungs clear Mild tenderness left TMJ, otherwise head nontender, HENT otherwise negative Neck: supple, nontender, no mass, no thyromegaly, no lymphadenopathy GU: normal male, circumcised, no mass, no lymphadenopathy, no rash or discharge, nontender Pulses normal psych pleasant, good eye contact     Assessment: Encounter Diagnoses  Name Primary?  . Insomnia Yes  .  TMJ (dislocation of temporomandibular joint), initial encounter   . Venereal disease contact   . Penile discharge     Plan: Insomnia - discussed sleep hygiene, he has used various remedies in the past.   Advised against day time naps.   Trial of Belsomra samples, 10 and 15mg  samples given.  TMJ - can use heat or ice, topical icy hot, Tylenol, less chewing for short term.  Recheck if not improving. Screen for STD given discomfort and concerns.  discussed safe sex. F/u pending labs.

## 2015-10-25 LAB — GC/CHLAMYDIA PROBE AMP
CT Probe RNA: NOT DETECTED
GC Probe RNA: NOT DETECTED

## 2015-10-25 LAB — RPR

## 2015-10-25 LAB — HIV ANTIBODY (ROUTINE TESTING W REFLEX): HIV: NONREACTIVE

## 2015-10-29 ENCOUNTER — Telehealth: Payer: Self-pay | Admitting: Medical

## 2015-10-29 ENCOUNTER — Other Ambulatory Visit: Payer: Self-pay | Admitting: Medical

## 2015-10-29 MED ORDER — SUVOREXANT 10 MG PO TABS: 1.0000 | ORAL_TABLET | Freq: Every day | ORAL | Status: DC

## 2015-10-29 NOTE — Telephone Encounter (Signed)
Pt said Br[place]made working good and asked for more samples of the 10mg 

## 2015-10-29 NOTE — Telephone Encounter (Signed)
Call out Belsomra 10mg  #30 for sleep, no refill, f/u 3-4 wk

## 2015-10-30 NOTE — Telephone Encounter (Signed)
Phoned in to [facility], Mickel Baas I forgot to save pts number in chart could you do that for me so that I will be able to let him know to follow up in 3-4 weeks?

## 2015-11-04 ENCOUNTER — Other Ambulatory Visit: Payer: Self-pay | Admitting: Medical

## 2015-11-04 MED ORDER — OSELTAMIVIR PHOSPHATE 75 MG PO CAPS: 75.0000 mg | ORAL_CAPSULE | Freq: Every day | ORAL | Status: DC

## 2015-11-06 NOTE — Telephone Encounter (Signed)
Ok # added to profile

## 2015-11-07 ENCOUNTER — Telehealth: Payer: Self-pay | Admitting: Medical

## 2015-11-07 ENCOUNTER — Other Ambulatory Visit: Payer: Self-pay | Admitting: Medical

## 2015-11-07 MED ORDER — SUVOREXANT 15 MG PO TABS: 15.0000 mg | ORAL_TABLET | Freq: Every day | ORAL | Status: DC

## 2015-11-07 NOTE — Telephone Encounter (Signed)
Pt states 15mg  works better, Will you send in Rx for Belsomra for 15 mg tabs for 90 days to [place] today please

## 2015-11-07 NOTE — Telephone Encounter (Signed)
Phoned in.

## 2015-11-07 NOTE — Telephone Encounter (Signed)
pls phone in medication to [facility], Belsroma 15mg  #90 and no refill

## 2015-11-21 MED FILL — DULoxetine HCL 60 MG CPEP: 60 | 90 days supply | Qty: 180 | Fill #0

## 2015-12-10 MED FILL — ARMODAFINIL 150 MG TABLET: 150 | 30 days supply | Qty: 30 | Fill #0

## 2015-12-31 DIAGNOSIS — F332 Major depressive disorder, recurrent severe without psychotic features: Secondary | ICD-10-CM | POA: Diagnosis not present

## 2015-12-31 MED FILL — clonazePAM 0.5 MG TABS: 0.5 | 30 days supply | Qty: 30 | Fill #0

## 2016-01-14 ENCOUNTER — Other Ambulatory Visit: Payer: Self-pay | Admitting: Medical

## 2016-01-14 MED ORDER — OSELTAMIVIR PHOSPHATE 75 MG PO CAPS: 75.0000 mg | ORAL_CAPSULE | Freq: Every day | ORAL | Status: DC

## 2016-01-14 MED FILL — OSELTAMIVIR PHOS 75 MG CAP: 75 | 10 days supply | Qty: 10 | Fill #0

## 2016-01-28 MED FILL — ARMODAFINIL 150 MG TABLET: 150 | 30 days supply | Qty: 30 | Fill #0

## 2016-01-28 MED FILL — diazePAM 10 MG TABS: 10 | 90 days supply | Qty: 90 | Fill #0

## 2016-02-16 MED FILL — DULoxetine HCL 60 MG CPEP: 60 | 90 days supply | Qty: 180 | Fill #0

## 2016-03-09 ENCOUNTER — Ambulatory Visit (INDEPENDENT_AMBULATORY_CARE_PROVIDER_SITE_OTHER): Payer: 59 | Admitting: Medical

## 2016-03-09 ENCOUNTER — Encounter: Payer: Self-pay | Admitting: Medical

## 2016-03-09 VITALS — BP 110/86 | HR 87 | Wt 154.0 lb

## 2016-03-09 DIAGNOSIS — M549 Dorsalgia, unspecified: Secondary | ICD-10-CM

## 2016-03-09 DIAGNOSIS — G8929 Other chronic pain: Secondary | ICD-10-CM | POA: Diagnosis not present

## 2016-03-09 DIAGNOSIS — L989 Disorder of the skin and subcutaneous tissue, unspecified: Secondary | ICD-10-CM

## 2016-03-09 DIAGNOSIS — G4700 Insomnia, unspecified: Secondary | ICD-10-CM

## 2016-03-09 MED ORDER — TIZANIDINE HCL 2 MG PO CAPS: 2.0000 mg | ORAL_CAPSULE | Freq: Every evening | ORAL | Status: DC | PRN

## 2016-03-09 MED FILL — tiZANidine HCL 2 MG TABS: 2 | 30 days supply | Qty: 30 | Fill #0

## 2016-03-09 NOTE — Progress Notes (Signed)
Subjective: Chief Complaint  Patient presents with  . bump on rear    showed up about a week ago. said that has been having a lot of pain from it, used bacitracin.    Here for bump on his buttocks.  Saw a white bump on left buttock that was tender and sore, been using Mupirocin ointment.  Seems improved today.   Wants to be sure its no tick or other issue.  No hx/o MRSA.   No recent abrasion or break in skin.  Has hx/o insomnia, chronic back pain.  Wants to change from flexeril to Zanaflex as the flexeril makes him feel hung over the next day even at the lower dose.  Past Medical History  Diagnosis Date  . Anxiety   . Depression   . GERD (gastroesophageal reflux disease)   . Tobacco use disorder   . Gallbladder polyp   . IBS (irritable bowel syndrome)   . Chronic back pain   . Exercise-induced asthma   . Chronic back pain   . Polyarthralgia   . Bipolar disorder ([place])     Dr. Pearson Grippe, [facility]  . Wears glasses    ROS as in subjective    Objective: BP 110/86 mmHg  Pulse 87  Wt 154 lb (69.854 kg)  Gen: wd, wn, nad Skin: anus and perianal region without obvious skin lesion, tenderness, fluctuance, warmth, erythema or induration.  No specific lesion seen.  Back: nontender, relatively normal ROM, no scoliosis Legs nontender, normal ROM Legs neurovascularly intact     Assessment: Encounter Diagnoses  Name Primary?  . Insomnia Yes  . Chronic back pain   . Skin lesion      Plan: Skin lesion - no obvious lesion present today.  Reassured.   It sounds as if his topical treatment of mupirocin was successful for a likely boil or pustule.    Insomnia, chronic back pain - c/t routine stretching and exercise.   Change to Zanaflex.  Stop flexeril.

## 2016-03-16 MED FILL — ARMODAFINIL 150 MG TABLET: 150 | 30 days supply | Qty: 30 | Fill #0

## 2016-03-16 MED FILL — clonazePAM 0.5 MG TABS: 0.5 | 30 days supply | Qty: 30 | Fill #0

## 2016-04-01 ENCOUNTER — Telehealth: Payer: Self-pay

## 2016-04-01 NOTE — Telephone Encounter (Signed)
Records faxed to [facility] per pt request via signed faxed authorization. Release ID # [id number]. Carl Hughes

## 2016-04-06 DIAGNOSIS — H5213 Myopia, bilateral: Secondary | ICD-10-CM | POA: Diagnosis not present

## 2016-04-19 MED FILL — LIDOCAINE 5% PATCH: 5 | 90 days supply | Qty: 90 | Fill #1

## 2016-04-19 MED FILL — tiZANidine HCL 2 MG TABS: 2 | 30 days supply | Qty: 30 | Fill #1

## 2016-04-22 DIAGNOSIS — F3181 Bipolar II disorder: Secondary | ICD-10-CM | POA: Diagnosis not present

## 2016-04-22 MED FILL — clonazePAM 1 MG TABS: 1 | 90 days supply | Qty: 90 | Fill #0

## 2016-04-26 MED FILL — diazePAM 10 MG TABS: 10 | 30 days supply | Qty: 30 | Fill #0

## 2016-05-24 ENCOUNTER — Other Ambulatory Visit: Payer: Self-pay | Admitting: Medical

## 2016-05-24 MED FILL — DULoxetine HCL 60 MG CPEP: 60 | 90 days supply | Qty: 180 | Fill #0

## 2016-05-24 NOTE — Telephone Encounter (Signed)
Is this ok to refill?  

## 2016-05-25 MED FILL — tiZANidine HCL 2 MG TABS: 2 | 30 days supply | Qty: 30 | Fill #0

## 2016-05-27 MED FILL — diazePAM 10 MG TABS: 10 | 90 days supply | Qty: 90 | Fill #0

## 2016-05-28 ENCOUNTER — Ambulatory Visit (INDEPENDENT_AMBULATORY_CARE_PROVIDER_SITE_OTHER): Payer: 59 | Admitting: Psychology

## 2016-05-28 DIAGNOSIS — F331 Major depressive disorder, recurrent, moderate: Secondary | ICD-10-CM | POA: Diagnosis not present

## 2016-05-28 DIAGNOSIS — F411 Generalized anxiety disorder: Secondary | ICD-10-CM

## 2016-06-01 MED FILL — clonazePAM 1 MG TABS: 1 | 90 days supply | Qty: 90 | Fill #0

## 2016-06-29 ENCOUNTER — Ambulatory Visit: Payer: 59 | Admitting: Psychology

## 2016-07-02 ENCOUNTER — Ambulatory Visit: Payer: 59 | Admitting: Psychology

## 2016-08-04 ENCOUNTER — Ambulatory Visit: Payer: 59 | Admitting: Medical

## 2016-08-06 ENCOUNTER — Ambulatory Visit (INDEPENDENT_AMBULATORY_CARE_PROVIDER_SITE_OTHER): Payer: 59 | Admitting: Medical

## 2016-08-06 ENCOUNTER — Encounter: Payer: Self-pay | Admitting: Medical

## 2016-08-06 VITALS — BP 102/62 | HR 97 | Temp 97.5°F | Ht 74.0 in | Wt 159.2 lb

## 2016-08-06 DIAGNOSIS — Z113 Encounter for screening for infections with a predominantly sexual mode of transmission: Secondary | ICD-10-CM | POA: Diagnosis not present

## 2016-08-06 DIAGNOSIS — R231 Pallor: Secondary | ICD-10-CM | POA: Diagnosis not present

## 2016-08-06 DIAGNOSIS — Z202 Contact with and (suspected) exposure to infections with a predominantly sexual mode of transmission: Secondary | ICD-10-CM

## 2016-08-06 DIAGNOSIS — K589 Irritable bowel syndrome without diarrhea: Secondary | ICD-10-CM | POA: Diagnosis not present

## 2016-08-06 DIAGNOSIS — R1013 Epigastric pain: Secondary | ICD-10-CM | POA: Diagnosis not present

## 2016-08-06 DIAGNOSIS — R109 Unspecified abdominal pain: Secondary | ICD-10-CM | POA: Diagnosis not present

## 2016-08-06 LAB — CBC
HCT: 39.6 % (ref 38.5–50.0)
Hemoglobin: 13.5 g/dL (ref 13.2–17.1)
MCH: 31.9 pg (ref 27.0–33.0)
MCHC: 34.1 g/dL (ref 32.0–36.0)
MCV: 93.6 fL (ref 80.0–100.0)
MPV: 9 fL (ref 7.5–12.5)
Platelets: 229 K/uL (ref 140–400)
RBC: 4.23 MIL/uL (ref 4.20–5.80)
RDW: 13.5 % (ref 11.0–15.0)
WBC: 5.5 K/uL (ref 4.0–10.5)

## 2016-08-06 LAB — COMPREHENSIVE METABOLIC PANEL WITH GFR
ALT: 19 U/L (ref 9–46)
AST: 24 U/L (ref 10–40)
Albumin: 4.9 g/dL (ref 3.6–5.1)
Alkaline Phosphatase: 56 U/L (ref 40–115)
BUN: 17 mg/dL (ref 7–25)
CO2: 31 mmol/L (ref 20–31)
Calcium: 9.7 mg/dL (ref 8.6–10.3)
Chloride: 105 mmol/L (ref 98–110)
Creat: 0.81 mg/dL (ref 0.60–1.35)
Glucose, Bld: 61 mg/dL — ABNORMAL LOW (ref 65–99)
Potassium: 4.3 mmol/L (ref 3.5–5.3)
Sodium: 142 mmol/L (ref 135–146)
Total Bilirubin: 0.7 mg/dL (ref 0.2–1.2)
Total Protein: 7.2 g/dL (ref 6.1–8.1)

## 2016-08-06 LAB — TSH: TSH: 1.22 m[IU]/L (ref 0.40–4.50)

## 2016-08-06 MED ORDER — OMEPRAZOLE 40 MG PO CPDR: 40.0000 mg | DELAYED_RELEASE_CAPSULE | Freq: Every day | ORAL | 0 refills | Status: DC

## 2016-08-06 MED FILL — OMEPRAZOLE DR 40 MG CAPSULE: 40 | 30 days supply | Qty: 30 | Fill #0

## 2016-08-06 NOTE — Progress Notes (Signed)
Subjective: Chief Complaint  Patient presents with  . Abdominal Pain    chronic, increased stomach pain, heartburn and indigestion   Here for chronic abdomina pain, mostly central abdomen, heartburn, indigestion.   Has had ongoing problems for months, 4-5 months.   Takes and hour to eat a sandwich due to the pains.   Gets terrible indigestion.  Has pain during and after eating.  He has used consider amount of NSAIDs in the past.  Has used prevacid in the past regularly, but no lately.   In recent months has daily soft BM, no recent problems with diarrhea, constipation.   Has had some nausea.   Has some discomfort with urination, wants STD screen.  Hasn't been drinking a lot of alcohol lately.  No fever, no weight loss, no blood in urine or stool.  No other aggravating or relieving factors. No other complaint.  Past Medical History:  Diagnosis Date  . Anxiety   . Bipolar disorder ([place])    Dr. Pearson Grippe, [facility]  . Chronic back pain   . Chronic back pain   . Depression   . Exercise-induced asthma   . Gallbladder polyp   . GERD (gastroesophageal reflux disease)   . IBS (irritable bowel syndrome)   . Polyarthralgia   . Tobacco use disorder   . Wears glasses    ROS as in subjective   Objective: BP 102/62 (BP Location: Right Arm, Patient Position: Sitting, Cuff Size: Normal)   Pulse 97   Temp 97.5 F (36.4 C) (Oral)   Ht 6\' 2"  (1.88 m)   Wt 159 lb 3.2 oz (72.2 kg)   SpO2 98%   BMI 20.44 kg/m   General appearance: alert, no distress, WD/WN, pale in general HEENT: normocephalic, sclerae anicteric, TMs pearly, nares patent, no discharge or erythema, pharynx normal Oral cavity: MMM, no lesions Neck: supple, no lymphadenopathy, no thyromegaly, no masses Heart: RRR, normal S1, S2, no murmurs Lungs: CTA bilaterally, no wheezes, rhonchi, or rales Abdomen: +bs, soft, non tender, non distended, no masses, no hepatomegaly, no splenomegaly Pulses: 2+ symmetric,  upper and lower extremities, normal cap refill No edema   Assessment: Encounter Diagnoses  Name Primary?  . Abdominal pain, unspecified abdominal location Yes  . Venereal disease contact   . Screen for STD (sexually transmitted disease)   . IBS (irritable bowel syndrome)   . Dyspepsia   . Pallor     Plan: discussed possible etiology, could include hiatal hernia, GERD, ulcer, h pylori, other.  Begin omeprazole, limit alcohol, NSAIDs, and pending labs consider EGD/GI referral.   STD labs today, discussed safe sex.   Aarav was seen today for abdominal pain.  Diagnoses and all orders for this visit:  Abdominal pain, unspecified abdominal location -     HIV antibody -     RPR -     GC/Chlamydia Probe Amp -     Comprehensive metabolic panel -     CBC -     TSH  Venereal disease contact -     HIV antibody -     RPR -     GC/Chlamydia Probe Amp  Screen for STD (sexually transmitted disease) -     HIV antibody -     RPR -     GC/Chlamydia Probe Amp  IBS (irritable bowel syndrome)  Dyspepsia  Pallor  Other orders -     omeprazole (PRILOSEC) 40 MG capsule; Take 1 capsule (40 mg total) by mouth daily.

## 2016-08-07 LAB — HIV ANTIBODY (ROUTINE TESTING W REFLEX): HIV 1&2 Ab, 4th Generation: NONREACTIVE

## 2016-08-07 LAB — RPR

## 2016-08-07 LAB — GC/CHLAMYDIA PROBE AMP
CT Probe RNA: NOT DETECTED
GC Probe RNA: NOT DETECTED

## 2016-08-10 ENCOUNTER — Other Ambulatory Visit: Payer: Self-pay | Admitting: Medical

## 2016-08-10 DIAGNOSIS — R109 Unspecified abdominal pain: Secondary | ICD-10-CM

## 2016-08-10 DIAGNOSIS — E162 Hypoglycemia, unspecified: Secondary | ICD-10-CM

## 2016-08-10 DIAGNOSIS — F3181 Bipolar II disorder: Secondary | ICD-10-CM | POA: Diagnosis not present

## 2016-08-10 DIAGNOSIS — R630 Anorexia: Secondary | ICD-10-CM

## 2016-08-10 DIAGNOSIS — F332 Major depressive disorder, recurrent severe without psychotic features: Secondary | ICD-10-CM | POA: Diagnosis not present

## 2016-08-23 MED FILL — DULoxetine HCL 60 MG CPEP: 60 | 90 days supply | Qty: 180 | Fill #0

## 2016-08-27 MED FILL — diazePAM 10 MG TABS: 10 | 90 days supply | Qty: 90 | Fill #0

## 2016-08-30 ENCOUNTER — Ambulatory Visit (INDEPENDENT_AMBULATORY_CARE_PROVIDER_SITE_OTHER): Payer: 59 | Admitting: Psychology

## 2016-08-30 DIAGNOSIS — F331 Major depressive disorder, recurrent, moderate: Secondary | ICD-10-CM

## 2016-08-30 DIAGNOSIS — F411 Generalized anxiety disorder: Secondary | ICD-10-CM | POA: Diagnosis not present

## 2016-09-01 ENCOUNTER — Telehealth: Payer: Self-pay | Admitting: Medical

## 2016-09-01 ENCOUNTER — Encounter: Payer: Self-pay | Admitting: Nurse Practitioner

## 2016-09-01 DIAGNOSIS — R109 Unspecified abdominal pain: Secondary | ICD-10-CM

## 2016-09-01 MED FILL — clonazePAM 1 MG TABS: 1 | 90 days supply | Qty: 90 | Fill #0

## 2016-09-01 NOTE — Telephone Encounter (Signed)
Order was placed and sent to [facility]

## 2016-09-01 NOTE — Telephone Encounter (Signed)
Patient called still having abdominal issues.   Please refer to GI/ [facility] [facility] for consult

## 2016-09-10 ENCOUNTER — Ambulatory Visit: Payer: 59 | Admitting: Psychology

## 2016-09-13 ENCOUNTER — Encounter: Payer: Self-pay | Admitting: Nurse Practitioner

## 2016-09-13 ENCOUNTER — Ambulatory Visit (INDEPENDENT_AMBULATORY_CARE_PROVIDER_SITE_OTHER): Payer: 59 | Admitting: Nurse Practitioner

## 2016-09-13 VITALS — BP 110/70 | HR 76 | Ht 75.0 in | Wt 154.0 lb

## 2016-09-13 DIAGNOSIS — R1010 Upper abdominal pain, unspecified: Secondary | ICD-10-CM

## 2016-09-13 DIAGNOSIS — R634 Abnormal weight loss: Secondary | ICD-10-CM | POA: Diagnosis not present

## 2016-09-13 DIAGNOSIS — K219 Gastro-esophageal reflux disease without esophagitis: Secondary | ICD-10-CM

## 2016-09-13 NOTE — Patient Instructions (Addendum)
You have been scheduled for an endoscopy. Please follow written instructions given to you at your visit today. If you use inhalers (even only as needed), please bring them with you on the day of your procedure. Your physician has requested that you go to www.startemmi.com and enter the access code given to you at your visit today. This web site gives a general overview about your procedure. However, you should still follow specific instructions given to you by our office regarding your preparation for the procedure.  If you are age 24 or younger, your body mass index should be between 19-25. Your Body mass index is 19.25 kg/m. If this is out of the aformentioned range listed, please consider follow up with your Primary Care Provider.     Gastroesophageal Reflux Disease, Adult Normally, food travels down the esophagus and stays in the stomach to be digested. However, when a person has gastroesophageal reflux disease (GERD), food and stomach acid move back up into the esophagus. When this happens, the esophagus becomes sore and inflamed. Over time, GERD can create small holes (ulcers) in the lining of the esophagus.  CAUSES This condition is caused by a problem with the muscle between the esophagus and the stomach (lower esophageal sphincter, or LES). Normally, the LES muscle closes after food passes through the esophagus to the stomach. When the LES is weakened or abnormal, it does not close properly, and that allows food and stomach acid to go back up into the esophagus. The LES can be weakened by certain dietary substances, medicines, and medical conditions, including:  Tobacco use.  Pregnancy.  Having a hiatal hernia.  Heavy alcohol use.  Certain foods and beverages, such as coffee, chocolate, onions, and peppermint. RISK FACTORS This condition is more likely to develop in:  People who have an increased body weight.  People who have connective tissue disorders.  People who use NSAID  medicines. SYMPTOMS Symptoms of this condition include:  Heartburn.  Difficult or painful swallowing.  The feeling of having a lump in the throat.  Abitter taste in the mouth.  Bad breath.  Having a large amount of saliva.  Having an upset or bloated stomach.  Belching.  Chest pain.  Shortness of breath or wheezing.  Ongoing (chronic) cough or a night-time cough.  Wearing away of tooth enamel.  Weight loss. Different conditions can cause chest pain. Make sure to see your health care provider if you experience chest pain. DIAGNOSIS Your health care provider will take a medical history and perform a physical exam. To determine if you have mild or severe GERD, your health care provider may also monitor how you respond to treatment. You may also have other tests, including:  An endoscopy toexamine your stomach and esophagus with a small camera.  A test thatmeasures the acidity level in your esophagus.  A test thatmeasures how much pressure is on your esophagus.  A barium swallow or modified barium swallow to show the shape, size, and functioning of your esophagus. TREATMENT The goal of treatment is to help relieve your symptoms and to prevent complications. Treatment for this condition may vary depending on how severe your symptoms are. Your health care provider may recommend:  Changes to your diet.  Medicine.  Surgery. HOME CARE INSTRUCTIONS Diet  Follow a diet as recommended by your health care provider. This may involve avoiding foods and drinks such as:  Coffee and tea (with or without caffeine).  Drinks that containalcohol.  Energy drinks and sports drinks.  Carbonated drinks or sodas.  Chocolate and cocoa.  Peppermint and mint flavorings.  Garlic and onions.  Horseradish.  Spicy and acidic foods, including peppers, chili powder, curry powder, vinegar, hot sauces, and barbecue sauce.  Citrus fruit juices and citrus fruits, such as oranges,  lemons, and limes.  Tomato-based foods, such as red sauce, chili, salsa, and pizza with red sauce.  Fried and fatty foods, such as donuts, french fries, potato chips, and high-fat dressings.  High-fat meats, such as hot dogs and fatty cuts of red and white meats, such as rib eye steak, sausage, ham, and bacon.  High-fat dairy items, such as whole milk, butter, and cream cheese.  Eat small, frequent meals instead of large meals.  Avoid drinking large amounts of liquid with your meals.  Avoid eating meals during the 2-3 hours before bedtime.  Avoid lying down right after you eat.  Do not exercise right after you eat. General Instructions  Pay attention to any changes in your symptoms.  Take over-the-counter and prescription medicines only as told by your health care provider. Do not take aspirin, ibuprofen, or other NSAIDs unless your health care provider told you to do so.  Do not use any tobacco products, including cigarettes, chewing tobacco, and e-cigarettes. If you need help quitting, ask your health care provider.  Wear loose-fitting clothing. Do not wear anything tight around your waist that causes pressure on your abdomen.  Raise (elevate) the head of your bed 6 inches (15cm).  Try to reduce your stress, such as with yoga or meditation. If you need help reducing stress, ask your health care provider.  If you are overweight, reduce your weight to an amount that is healthy for you. Ask your health care provider for guidance about a safe weight loss goal.  Keep all follow-up visits as told by your health care provider. This is important. SEEK MEDICAL CARE IF:  You have new symptoms.  You have unexplained weight loss.  You have difficulty swallowing, or it hurts to swallow.  You have wheezing or a persistent cough.  Your symptoms do not improve with treatment.  You have a hoarse voice. SEEK IMMEDIATE MEDICAL CARE IF:  You have pain in your arms, neck, jaw,  teeth, or back.  You feel sweaty, dizzy, or light-headed.  You have chest pain or shortness of breath.  You vomit and your vomit looks like blood or coffee grounds.  You faint.  Your stool is bloody or black.  You cannot swallow, drink, or eat.   This information is not intended to replace advice given to you by your health care provider. Make sure you discuss any questions you have with your health care provider.   Document Released: 08/04/2005 Document Revised: 07/16/2015 Document Reviewed: 02/19/2015 Elsevier Interactive Patient Education [employer].

## 2016-09-14 NOTE — Progress Notes (Signed)
HPI: Patient is a 25 year old male referred by PCP, Chana Bode, P.A for abdominal pain. Patient states he has had abdominal pain since middle school. She's been diagnosed with irritable bowel syndrome and GERD. Patient took Hyoscyamine for years, stopped because it started making him dizzy. He is taken several different PPIs. Patient has chronic back pain/fibromyalgia. He took naproxen for many years. Patient is here with his mother for evaluation of weight loss and abdominal pain. Over the last four years ago patient has lost 118 pounds though weight stable around 154 pounds in the last 6 months. He drinks Ensure twice a day. Food feels like "sandpaper" in his stomach and causes intestines to feel knotted up (points to mid and upper abdomen). He has to stand up to eat now. Bowel movements are normal.  Currently not taking anything for GERD since nothing helped.  He has made several dietary adjustments to help with GERD  Patient has a longstanding history of depression / bipolar disorder. Per mom patient was once taking 26 pills a day. Patient on the least amount on psychiatric meds he has ever been on.  Followed by Psychiatry.    Past Medical History:  Diagnosis Date  . Anal fissure   . Anxiety   . Bipolar disorder ([place])    Dr. Pearson Grippe, [facility]  . Chronic back pain   . Depression   . Exercise-induced asthma   . Fibromyalgia   . Gallbladder polyp   . GERD (gastroesophageal reflux disease)   . IBS (irritable bowel syndrome)   . Polyarthralgia   . Tobacco use disorder   . Wears glasses      Past Surgical History:  Procedure Laterality Date  . WISDOM TOOTH EXTRACTION     Family History  Problem Relation Age of Onset  . Depression Father   . Other Father     chronic back pain  . Arthritis Father   . Gout Father   . Heart disease Paternal Grandfather   . Depression Paternal Grandfather   . Colon polyps Mother     benign  . Irritable bowel  syndrome Mother   . Heart disease Maternal Grandmother   . Diabetes Maternal Grandfather   . Cancer Maternal Grandfather     colorectal  . Hypertension Maternal Grandfather   . Hyperlipidemia Maternal Grandfather    Social History  Substance Use Topics  . Smoking status: Current Every Day Smoker    Packs/day: 1.00    Years: 7.00    Types: Cigarettes  . Smokeless tobacco: Never Used     Comment: tobacco info given today  . Alcohol use No   Current Outpatient Prescriptions  Medication Sig Dispense Refill  . clonazePAM (KLONOPIN) 1 MG tablet Take 1 mg by mouth daily as needed for anxiety.    . cyclobenzaprine (FLEXERIL) 10 MG tablet Take 1 tablet (10 mg total) by mouth 1 day or 1 dose. 30 tablet 1  . diazepam (VALIUM) 10 MG tablet Take 10 mg by mouth at bedtime.    . DULoxetine (CYMBALTA) 60 MG capsule Take 2 capsules (120 mg total) by mouth every morning. 60 capsule 0  . hydrocortisone 2.5 % cream APPLY TO AFFECTED AREA(S) TWICE DAILY. 28 g PRN  . omeprazole (PRILOSEC) 40 MG capsule Take 1 capsule (40 mg total) by mouth daily. 30 capsule 0  . tiZANidine (ZANAFLEX) 2 MG tablet TAKE 1 TABLET BY MOUTH ONCE DAILY AT BEDTIME AS NEEDED FOR MUSCLE  SPASMS. 30 tablet 0   No current facility-administered medications for this visit.    Allergies  Allergen Reactions  . Zofran [Ondansetron Hcl] Rash  . Buspar [Buspirone Hcl]     unknown  . Ibuprofen [Ibuprofen] Hives  . Lamictal [Lamotrigine]     unknown     Review of Systems: Positive for anxiety, back pain, depression, headaches, muscle pains and cramps, and sleeping problems . All other systems reviewed and negative except where noted in HPI.    Physical Exam: BP 110/70   Pulse 76   Ht 6\' 3"  (1.905 m)   Wt 154 lb (69.9 kg)   BMI 19.25 kg/m  Constitutional: Very thin white male in no acute distress. HEENT: Normocephalic and atraumatic. Conjunctivae are normal. No scleral icterus. Neck supple.  Cardiovascular: Normal rate,  regular rhythm.  Pulmonary/chest: Effort normal and breath sounds normal. No wheezing, rales or rhonchi. Abdominal: Soft, nondistended, nontender. Bowel sounds active throughout. There are no masses palpable. No hepatomegaly. Extremities: no edema Lymphadenopathy: No cervical adenopathy noted. Neurological: Alert and oriented to person place and time. Skin: Skin is warm and dry. No rashes noted. Psychiatric: Normal mood and affect. Behavior is normal.   ASSESSMENT AND PLAN: 20. 25 year old male with chronic (years) mid and upper abdominal pain exacerbated by eating, some nausea. Took Naprosyn for years which has mother worried. No recent NSAIDS. Patient also has longstanding GERD with heartburn refractory to multiple PPIs.He has lost 118 pounds over last 4 years though reassuring that weight stable over last 6 months.  -His symptoms may be functional but need to exclude PUD, severe gastritis, etc..Marland KitchenWill schedule patient for EGD. The risks and benefits of EGD were discussed and the patient agrees to proceed -no need to repeat labs as CBC, TSH, HIV and chem profile in late Sept were unremarkable, including a normal albumin.   2. Longstanding history of depression / bipolar, under care of psych. On medication.   3. Chronic back pain / fibromyalgia.   On Cymbalta, muscle relaxers  4. Tobacco use   Tye Savoy  09/14/2016, 3:49 PM  Cc:  Carlena Hurl, PA-C

## 2016-09-14 NOTE — Progress Notes (Signed)
Reviewed and agree with documentation and assessment and plan. K. Veena Nandigam , MD   

## 2016-09-21 ENCOUNTER — Encounter: Payer: Self-pay | Admitting: Gastroenterology

## 2016-09-27 ENCOUNTER — Ambulatory Visit (AMBULATORY_SURGERY_CENTER): Payer: 59 | Admitting: Gastroenterology

## 2016-09-27 ENCOUNTER — Encounter: Payer: Self-pay | Admitting: Gastroenterology

## 2016-09-27 VITALS — BP 99/57 | HR 61 | Temp 98.0°F | Resp 18 | Ht 75.0 in | Wt 154.0 lb

## 2016-09-27 DIAGNOSIS — F319 Bipolar disorder, unspecified: Secondary | ICD-10-CM | POA: Diagnosis not present

## 2016-09-27 DIAGNOSIS — R1011 Right upper quadrant pain: Secondary | ICD-10-CM | POA: Diagnosis not present

## 2016-09-27 DIAGNOSIS — R1010 Upper abdominal pain, unspecified: Secondary | ICD-10-CM

## 2016-09-27 DIAGNOSIS — R634 Abnormal weight loss: Secondary | ICD-10-CM

## 2016-09-27 MED ORDER — SODIUM CHLORIDE 0.9 % IV SOLN: 500.0000 mL | INTRAVENOUS | Status: DC

## 2016-09-27 NOTE — Op Note (Signed)
[place] Patient Name: Carl Hughes Procedure Date: 09/27/2016 10:20 AM MRN: [id number] Endoscopist: Mauri Pole , MD Age: 25 Referring MD:  Date of Birth: Aug 22, 1991 Gender: Male Account #: [id number] Procedure:                Upper GI endoscopy Indications:              Upper abdominal symptoms that persist despite an                            appropriate trial of therapy, Upper abdominal                            symptoms associated with anorexia (suggesting                            structural disease), Upper abdominal symptoms                            associated with weight loss (suggesting structural                            disease) Medicines:                Monitored Anesthesia Care Procedure:                Pre-Anesthesia Assessment:                           - Prior to the procedure, a History and Physical                            was performed, and patient medications and                            allergies were reviewed. The patient's tolerance of                            previous anesthesia was also reviewed. The risks                            and benefits of the procedure and the sedation                            options and risks were discussed with the patient.                            All questions were answered, and informed consent                            was obtained. Prior Anticoagulants: The patient has                            taken no previous anticoagulant or antiplatelet  agents. ASA Grade Assessment: II - A patient with                            mild systemic disease. After reviewing the risks                            and benefits, the patient was deemed in                            satisfactory condition to undergo the procedure.                           After obtaining informed consent, the endoscope was                            passed under direct vision. Throughout the                   procedure, the patient's blood pressure, pulse, and                            oxygen saturations were monitored continuously. The                            Model GIF-[id number]) scope was introduced                            through the mouth, and advanced to the second part                            of duodenum. The upper GI endoscopy was                            accomplished without difficulty. The patient                            tolerated the procedure well. Scope In: Scope Out: Findings:                 The esophagus was normal.                           A small hiatal hernia was present.                           The exam was otherwise without abnormality.                           The examined duodenum was normal. Complications:            No immediate complications. Estimated Blood Loss:     Estimated blood loss: none. Impression:               - Normal esophagus.                           - Small hiatal hernia.                           -  The examination was otherwise normal.                           - Normal examined duodenum.                           - No specimens collected. Recommendation:           - Patient has a contact number available for                            emergencies. The signs and symptoms of potential                            delayed complications were discussed with the                            patient. Return to normal activities tomorrow.                            Written discharge instructions were provided to the                            patient.                           - Resume previous diet.                           - Continue present medications.                           - No repeat upper endoscopy.                           - Return to GI office PRN. Mauri Pole, MD 09/27/2016 10:30:18 AM This report has been signed electronically.

## 2016-09-27 NOTE — Progress Notes (Signed)
A/ox3, pleased with MAC, report to RN 

## 2016-09-27 NOTE — Progress Notes (Signed)
Patient used marijuana on Saturday, 09/25/16.

## 2016-09-27 NOTE — Patient Instructions (Signed)
YOU HAD AN ENDOSCOPIC PROCEDURE TODAY AT THE [facility]:   Refer to the procedure report that was given to you for any specific questions about what was found during the examination.  If the procedure report does not answer your questions, please call your gastroenterologist to clarify.  If you requested that your care partner not be given the details of your procedure findings, then the procedure report has been included in a sealed envelope for you to review at your convenience later.  YOU SHOULD EXPECT: Some feelings of bloating in the abdomen. Passage of more gas than usual.  Walking can help get rid of the air that was put into your GI tract during the procedure and reduce the bloating. If you had a lower endoscopy (such as a colonoscopy or flexible sigmoidoscopy) you may notice spotting of blood in your stool or on the toilet paper. If you underwent a bowel prep for your procedure, you may not have a normal bowel movement for a few days.  Please Note:  You might notice some irritation and congestion in your nose or some drainage.  This is from the oxygen used during your procedure.  There is no need for concern and it should clear up in a day or so.  SYMPTOMS TO REPORT IMMEDIATELY:     Following upper endoscopy (EGD)  Vomiting of blood or coffee ground material  New chest pain or pain under the shoulder blades  Painful or persistently difficult swallowing  New shortness of breath  Fever of 100F or higher  Black, tarry-looking stools  For urgent or emergent issues, a gastroenterologist can be reached at any hour by calling [phone].   DIET:  We do recommend a small meal at first, but then you may proceed to your regular diet.  Drink plenty of fluids but you should avoid alcoholic beverages for 24 hours.  ACTIVITY:  You should plan to take it easy for the rest of today and you should NOT DRIVE or use heavy machinery until tomorrow (because of the sedation medicines  used during the test).    FOLLOW UP: Our staff will call the number listed on your records the next business day following your procedure to check on you and address any questions or concerns that you may have regarding the information given to you following your procedure. If we do not reach you, we will leave a message.  However, if you are feeling well and you are not experiencing any problems, there is no need to return our call.  We will assume that you have returned to your regular daily activities without incident.  If any biopsies were taken you will be contacted by phone or by letter within the next 1-3 weeks.  Please call us at [phone] if you have not heard about the biopsies in 3 weeks.    SIGNATURES/CONFIDENTIALITY: You and/or your care partner have signed paperwork which will be entered into your electronic medical record.  These signatures attest to the fact that that the information above on your After Visit Summary has been reviewed and is understood.  Full responsibility of the confidentiality of this discharge information lies with you and/or your care-partner.   Resume medications. Information given on hiatal hernia. 

## 2016-09-28 ENCOUNTER — Telehealth: Payer: Self-pay | Admitting: *Deleted

## 2016-09-28 NOTE — Telephone Encounter (Signed)
  Follow up Call-  Call back number 09/27/2016  Post procedure Call Back phone  # [phone]  Permission to leave phone message Yes  Some recent data might be hidden     Patient questions:  Do you have a fever, pain , or abdominal swelling? No. Pain Score  0 *  Have you tolerated food without any problems? Yes.    Have you been able to return to your normal activities? Yes.    Do you have any questions about your discharge instructions: Diet   No. Medications  No. Follow up visit  No.  Do you have questions or concerns about your Care? No.  Actions: * If pain score is 4 or above: No action needed, pain <4.

## 2016-11-02 MED FILL — LIDOCAINE 5% PATCH: 5 | 90 days supply | Qty: 90 | Fill #2

## 2016-11-22 MED FILL — DULoxetine HCL 60 MG CPEP: 60 | 90 days supply | Qty: 180 | Fill #0

## 2016-11-30 MED FILL — diazePAM 10 MG TABS: 10 | 90 days supply | Qty: 90 | Fill #0

## 2016-12-03 MED FILL — clonazePAM 1 MG TABS: 1 | 90 days supply | Qty: 90 | Fill #0

## 2016-12-21 DIAGNOSIS — F3181 Bipolar II disorder: Secondary | ICD-10-CM | POA: Diagnosis not present

## 2016-12-30 MED FILL — CONCERTA 36 MG TABLET ER: 36 | 30 days supply | Qty: 30 | Fill #0

## 2017-01-28 ENCOUNTER — Encounter: Payer: Self-pay | Admitting: Nurse Practitioner

## 2017-01-28 ENCOUNTER — Ambulatory Visit (INDEPENDENT_AMBULATORY_CARE_PROVIDER_SITE_OTHER): Payer: 59 | Admitting: Nurse Practitioner

## 2017-01-28 VITALS — BP 100/60 | HR 80 | Ht 74.0 in | Wt 164.4 lb

## 2017-01-28 DIAGNOSIS — R109 Unspecified abdominal pain: Secondary | ICD-10-CM

## 2017-01-28 DIAGNOSIS — R112 Nausea with vomiting, unspecified: Secondary | ICD-10-CM

## 2017-01-28 MED ORDER — RANITIDINE HCL 150 MG PO TABS: 150.0000 mg | ORAL_TABLET | Freq: Two times a day (BID) | ORAL | 11 refills | Status: DC

## 2017-01-28 MED FILL — raNITIdine HCL 150 MG TABS: 150 | 30 days supply | Qty: 60 | Fill #0

## 2017-01-28 NOTE — Progress Notes (Signed)
     HPI: Patient is a 26 year old male who I saw in November for abdominal pain. At the time patient gave a very long history of abdominal pain/IBS/GERD.Marland Kitchen Patient states he lost a lot of weight since I saw him last.. He has a long-standing history of depression and bipolar disorder. Scheduled him for an upper endoscopy which was normal except for a small hiatal hernia. Patient comes in today to discuss results. He cannot recall findings of the exam. He is his mother Mickel Baas. Patient continues to have the same symptoms. He has postprandial diffuse abdominal pain. He has too stand to eat or feels like food will not digest. Frequent bloating and nausea. He has regurgitation and heartburn, concerned about being on PPI longterm. Was on a bowel antispasmodic for years but then developed severe vertigo so doesn't want to take anymore. Mother states she knows Wrigley has mental illness but doesn't feel it it causing all these symptoms for years.     Past Medical History:  Diagnosis Date  . Anal fissure   . Anxiety   . Bipolar disorder ([place])    Dr. Pearson Grippe, [facility]  . Chronic back pain   . Depression   . Exercise-induced asthma   . Fibromyalgia   . Gallbladder polyp   . GERD (gastroesophageal reflux disease)   . IBS (irritable bowel syndrome)   . Polyarthralgia   . Substance abuse   . Tobacco use disorder   . Wears glasses     Patient's surgical history, family medical history, social history, medications and allergies were all reviewed in Epic    Physical Exam: BP 100/60   Pulse 80   Ht 6\' 2"  (1.88 m)   Wt 164 lb 6.4 oz (74.6 kg)   SpO2 98%   BMI 21.11 kg/m   GENERAL: very thin white male in NAD NEURO: Alert and oriented x 3, response time a little delayed    ASSESSMENT and PLAN:   26 year old male here with mother to discuss EGD findings and next step. Still has same chronic postprandial abdominal pain. He has to eat standing up or "food doesn't digest".  He has bloating and uncontrolled GERD symptoms. He reports significant weight loss since I saw him last, by our scales he has gained. I'll ask nurse to recheck before leaving.  -Difficult situation, patient has significant psychiatric history, history of substance abuse. Intolerate to bowel antispasmodics now. Doesn't want to take PPI long term. I wonder if he doesn't have gastroparesis though even if he did it would likely be answer to all his symptoms. Given psych history I am hesitant to given empirical trial of Reglan. Will obtain a gastric emptying study. He agrees to hold any meds necessary for the procedure.  -trial of zantac for GERD symptoms.     Tye Savoy , NP 01/28/2017, 2:43 PM

## 2017-01-28 NOTE — Patient Instructions (Signed)
We have sent the following medications to your pharmacy for you to pick up at your convenience: Zantac.  You have been scheduled for a gastric emptying scan at [facility] on 02/03/17 at 9:00am. Please arrive at least 15 minutes prior to your appointment for registration. Please make certain not to have anything to eat or drink after midnight the night before your test. Hold all stomach medications (ex: Zofran, phenergan, Reglan) 48 hours prior to your test. If you need to reschedule your appointment, please contact radiology scheduling at [phone]. _____________________________________________________________________ A gastric-emptying study measures how long it takes for food to move through your stomach. There are several ways to measure stomach emptying. In the most common test, you eat food that contains a small amount of radioactive material. A scanner that detects the movement of the radioactive material is placed over your abdomen to monitor the rate at which food leaves your stomach. This test normally takes about 4 hours to complete. _____________________________________________________________________

## 2017-01-31 ENCOUNTER — Telehealth: Payer: Self-pay | Admitting: Nurse Practitioner

## 2017-02-03 ENCOUNTER — Ambulatory Visit (HOSPITAL_COMMUNITY): Payer: 59

## 2017-02-03 NOTE — Progress Notes (Signed)
Reviewed and agree with documentation and assessment and plan. K. Veena Maximilian Tallo , MD   

## 2017-02-17 MED FILL — DULoxetine HCL 60 MG CPEP: 60 | 30 days supply | Qty: 60 | Fill #0

## 2017-02-28 MED FILL — diazePAM 10 MG TABS: 10 | 90 days supply | Qty: 90 | Fill #0

## 2017-03-03 ENCOUNTER — Ambulatory Visit: Payer: Self-pay | Admitting: Gastroenterology

## 2017-03-03 MED FILL — clonazePAM 1 MG TABS: 1 | 30 days supply | Qty: 30 | Fill #0

## 2017-03-16 ENCOUNTER — Ambulatory Visit (HOSPITAL_COMMUNITY)
Admission: RE | Admit: 2017-03-16 | Discharge: 2017-03-16 | Disposition: A | Discharge status: Home / Self Care | Payer: 59 | Source: Ambulatory Visit | Attending: Nurse Practitioner | Admitting: Nurse Practitioner

## 2017-03-16 DIAGNOSIS — R109 Unspecified abdominal pain: Secondary | ICD-10-CM | POA: Diagnosis not present

## 2017-03-16 DIAGNOSIS — R112 Nausea with vomiting, unspecified: Secondary | ICD-10-CM | POA: Insufficient documentation

## 2017-03-16 MED ORDER — TECHNETIUM TC 99M SULFUR COLLOID
2.0000 | Freq: Once | INTRAVENOUS | Status: AC | PRN
  Administered 2017-03-16: 2 via INTRAVENOUS

## 2017-03-18 ENCOUNTER — Telehealth: Payer: Self-pay | Admitting: Gastroenterology

## 2017-03-18 NOTE — Telephone Encounter (Signed)
Beth, only a mild delay in stomach emptying which cannot account for his symptoms. Please get him in to see Nandigam. I have seen in 3 times. I would like her input on him . Thanks

## 2017-03-18 NOTE — Telephone Encounter (Signed)
The results are in [facility]

## 2017-03-21 MED FILL — DULoxetine HCL 60 MG CPEP: 60 | 30 days supply | Qty: 60 | Fill #1

## 2017-03-21 NOTE — Telephone Encounter (Signed)
Confirmed the appointment with [facility]

## 2017-03-21 NOTE — Telephone Encounter (Signed)
Spoke with the patient's mother. Advised of the results. Offered an appointmen t5/15/18 with Dr Silverio Decamp. She will check her schedule and call back to confirm or change the appointment.

## 2017-03-22 ENCOUNTER — Ambulatory Visit (INDEPENDENT_AMBULATORY_CARE_PROVIDER_SITE_OTHER): Payer: 59 | Admitting: Gastroenterology

## 2017-03-22 ENCOUNTER — Encounter (INDEPENDENT_AMBULATORY_CARE_PROVIDER_SITE_OTHER): Payer: Self-pay

## 2017-03-22 ENCOUNTER — Encounter: Payer: Self-pay | Admitting: Gastroenterology

## 2017-03-22 VITALS — BP 80/60 | HR 82 | Ht 75.0 in | Wt 165.0 lb

## 2017-03-22 DIAGNOSIS — K5900 Constipation, unspecified: Secondary | ICD-10-CM | POA: Diagnosis not present

## 2017-03-22 DIAGNOSIS — R110 Nausea: Secondary | ICD-10-CM

## 2017-03-22 DIAGNOSIS — K589 Irritable bowel syndrome without diarrhea: Secondary | ICD-10-CM | POA: Diagnosis not present

## 2017-03-22 DIAGNOSIS — R1013 Epigastric pain: Secondary | ICD-10-CM

## 2017-03-22 DIAGNOSIS — R109 Unspecified abdominal pain: Secondary | ICD-10-CM

## 2017-03-22 NOTE — Progress Notes (Signed)
Carl Hughes    [id number]    [birth year]  Primary Care Physician:Tysinger, Camelia Eng, PA-C  Referring Physician: Carlena Hurl, PA-C [address] [place], [place] [address]  Chief complaint:  Abdominal pain  HPI:  26 year old male with history of bipolar disorder, anxiety, fibromyalgia, substance abuse history here with complaints of chronic abdominal pain for follow-up visit. Patient was last seen by Tye Savoy 01/28/17 Workup so far is unrevealing for any significant pathology. EGD was unremarkable. Gastric emptying study showed mild delay in emptying at 4 hours. He complains of waking up with pain sometimes after 6 or 8 hours of sleep. He has irregular sleep pattern and eating habits. Patient could not provide detailed history. He said he can wake up in the morning at 8 AM or any time later, sometimes he sleeps all day and wakes up around 5 PM. Denies any vomiting but has intermittent nausea. No melena or blood per rectum. History of constipation with irregular bowel movement and bloating.  He has tried gabapentin in the past with no improvement of abdominal pain. According to his mother she has weaned him off multiple medications as weren't helping with his symptoms  Outpatient Encounter Prescriptions as of 03/22/2017  Medication Sig  . cetirizine (ZYRTEC) 10 MG tablet Take 10 mg by mouth daily.  . clonazePAM (KLONOPIN) 1 MG tablet Take 1 mg by mouth daily as needed for anxiety.  . cyclobenzaprine (FLEXERIL) 10 MG tablet Take 1 tablet (10 mg total) by mouth 1 day or 1 dose.  . diazepam (VALIUM) 10 MG tablet Take 10 mg by mouth at bedtime.  . DULoxetine (CYMBALTA) 60 MG capsule Take 2 capsules (120 mg total) by mouth every morning.  . hydrocortisone 2.5 % cream APPLY TO AFFECTED AREA(S) TWICE DAILY.  . ranitidine (ZANTAC) 150 MG tablet Take 1 tablet (150 mg total) by mouth 2 (two) times daily.  Marland Kitchen tiZANidine (ZANAFLEX) 2 MG tablet TAKE 1 TABLET BY MOUTH ONCE DAILY AT  BEDTIME AS NEEDED FOR MUSCLE SPASMS.   Facility-Administered Encounter Medications as of 03/22/2017  Medication  . 0.9 %  sodium chloride infusion    Allergies as of 03/22/2017 - Review Complete 03/22/2017  Allergen Reaction Noted  . Zofran [ondansetron hcl] Rash 11/10/2013  . Buspar [buspirone hcl]    . Ibuprofen [ibuprofen] Hives   . Lamictal [lamotrigine]      Past Medical History:  Diagnosis Date  . Anal fissure   . Anxiety   . Bipolar disorder ([place])    Dr. Pearson Grippe, [facility]  . Chronic back pain   . Depression   . Exercise-induced asthma   . Fibromyalgia   . Gallbladder polyp   . GERD (gastroesophageal reflux disease)   . IBS (irritable bowel syndrome)   . Polyarthralgia   . Substance abuse   . Tobacco use disorder   . Wears glasses     Past Surgical History:  Procedure Laterality Date  . WISDOM TOOTH EXTRACTION      Family History  Problem Relation Age of Onset  . Depression Father   . Other Father        chronic back pain  . Arthritis Father   . Gout Father   . Heart disease Paternal Grandfather   . Depression Paternal Grandfather   . Colon polyps Mother        benign  . Irritable bowel syndrome Mother   . Heart disease Maternal Grandmother   .  Diabetes Maternal Grandfather   . Cancer Maternal Grandfather        colorectal  . Hypertension Maternal Grandfather   . Hyperlipidemia Maternal Grandfather     Social History   Social History  . Marital status: Single    Spouse name: N/A  . Number of children: 0  . Years of education: N/A   Occupational History  . unemployed    Social History Main Topics  . Smoking status: Current Every Day Smoker    Packs/day: 0.25    Years: 7.00    Types: Cigarettes  . Smokeless tobacco: Never Used     Comment: tobacco info given today  . Alcohol use No  . Drug use: Yes    Types: Marijuana  . Sexual activity: Yes    Birth control/ protection: None, Condom   Other Topics Concern    . Not on file   Social History Narrative   No relationship currently.   Unemployed.   Hobbies - none.  Enjoys his puppy      Review of systems: Review of Systems  Constitutional: Negative for fever and chills.  HENT: Negative.   Eyes: Negative for blurred vision.  Respiratory: Negative for cough, shortness of breath and wheezing.   Cardiovascular: Negative for chest pain and palpitations.  Gastrointestinal: as per HPI Genitourinary: Negative for dysuria, urgency, frequency and hematuria.  Musculoskeletal: Negative for myalgias, back pain and joint pain.  Skin: Negative for itching and rash.  Neurological: Negative for dizziness, tremors, focal weakness, seizures and loss of consciousness.  Endo/Heme/Allergies: Positive for seasonal allergies.  Psychiatric/Behavioral: Negative for depression, suicidal ideas and hallucinations.  All other systems reviewed and are negative.   Physical Exam: Vitals:   03/22/17 1516  BP: (!) 80/60  Pulse: 82   Body mass index is 20.62 kg/m. Gen:      No acute distress HEENT:  EOMI, sclera anicteric Neck:     No masses; no thyromegaly Lungs:    Clear to auscultation bilaterally; normal respiratory effort CV:         Regular rate and rhythm; no murmurs Abd:      + bowel sounds; soft, non-tender; no palpable masses, no distension Ext:    No edema; adequate peripheral perfusion Skin:      Warm and dry; no rash Neuro: alert and oriented x 3 Psych: normal mood and affect  Data Reviewed:  Reviewed labs, radiology imaging, old records and pertinent past GI work up   Assessment and Plan/Recommendations:  26 year old male accompanied by his mother with history of depression, anxiety disorder and chronic abdominal pain here for follow-up visit  Patient had mild delay in gastric emptying at 4 hours likely related to his eating disorder with decreased by mouth intake and irregular eating habits Discussed with patient to eat frequent small  meals Promotility agents unlikely to help [place] patient clearly has underlying eating disorder and behavioral issues  Trial of FD Donald Prose and IB Gard 1 capsule 3 times a day as needed for dyspepsia and abdominal discomfort  Constipation: Start linzess 72 g daily Increase dietary fluid intake and regular meals  Return in 3 months or sooner if needed   Damaris Hippo , MD [phone] Mon-Fri 8a-5p [phone] after 5p, weekends, holidays  CC: Tysinger, Camelia Eng, PA-C

## 2017-03-22 NOTE — Patient Instructions (Signed)
We have given you samples of FDGard and IBGard to take daily  We have also given you samples of Linzess to take once daily for constipation  Follow up in 3 months

## 2017-04-01 MED FILL — clonazePAM 1 MG TABS: 1 | 30 days supply | Qty: 30 | Fill #1

## 2017-04-19 DIAGNOSIS — H5213 Myopia, bilateral: Secondary | ICD-10-CM | POA: Diagnosis not present

## 2017-04-20 MED FILL — DULoxetine HCL 60 MG CPEP: 60 | 30 days supply | Qty: 60 | Fill #2

## 2017-04-29 MED FILL — clonazePAM 1 MG TABS: 1 | 30 days supply | Qty: 30 | Fill #2

## 2017-05-18 MED FILL — DULoxetine HCL 60 MG CPEP: 60 | 90 days supply | Qty: 180 | Fill #0

## 2017-06-01 MED FILL — diazePAM 10 MG TABS: 10 | 90 days supply | Qty: 90 | Fill #0

## 2017-06-23 MED FILL — clonazePAM 1 MG TABS: 1 | 90 days supply | Qty: 90 | Fill #0

## 2017-06-29 NOTE — Telephone Encounter (Signed)
Orders were sent and ov was sch for 5.15.18

## 2017-07-07 DIAGNOSIS — F3181 Bipolar II disorder: Secondary | ICD-10-CM | POA: Diagnosis not present

## 2017-07-07 MED FILL — DEXTROAMP-AMPHETAMIN 20 MG: 20 | 30 days supply | Qty: 30 | Fill #0

## 2017-07-07 MED FILL — PRAZOSIN 2 MG CAPSULE: 2 | 30 days supply | Qty: 30 | Fill #0

## 2017-07-07 MED FILL — QUETIAPINE FUMARATE 100 MG: 100 | 30 days supply | Qty: 30 | Fill #0

## 2017-08-15 MED FILL — DULoxetine HCL 60 MG CPEP: 60 | 30 days supply | Qty: 60 | Fill #0

## 2017-08-15 MED FILL — AMPHETAMINE SALTS 20 MG TAB: 20 | 30 days supply | Qty: 30 | Fill #0

## 2017-08-29 MED FILL — diazePAM 10 MG TABS: 10 | 30 days supply | Qty: 30 | Fill #0

## 2017-09-19 MED FILL — DULoxetine HCL 60 MG CPEP: 60 | 30 days supply | Qty: 60 | Fill #1

## 2017-10-12 MED FILL — diazePAM 10 MG TABS: 10 | 30 days supply | Qty: 30 | Fill #1

## 2017-10-13 MED FILL — DULoxetine HCL 60 MG CPEP: 60 | 30 days supply | Qty: 60 | Fill #2

## 2017-11-11 ENCOUNTER — Other Ambulatory Visit: Payer: Self-pay | Admitting: Medical

## 2017-11-11 MED ORDER — OSELTAMIVIR PHOSPHATE 75 MG PO CAPS: 75.0000 mg | ORAL_CAPSULE | Freq: Every day | ORAL | 0 refills | Status: DC

## 2017-11-11 MED FILL — OSELTAMIVIR PHOS 75 MG CAP: 75 | 10 days supply | Qty: 10 | Fill #0

## 2017-11-16 MED FILL — diazePAM 10 MG TABS: 10 | 30 days supply | Qty: 30 | Fill #2

## 2017-11-18 MED FILL — DULoxetine HCL 60 MG CPEP: 60 | 90 days supply | Qty: 180 | Fill #0

## 2017-12-13 DIAGNOSIS — M5412 Radiculopathy, cervical region: Secondary | ICD-10-CM | POA: Diagnosis not present

## 2017-12-13 DIAGNOSIS — Z681 Body mass index (BMI) 19 or less, adult: Secondary | ICD-10-CM | POA: Diagnosis not present

## 2017-12-13 DIAGNOSIS — M545 Low back pain: Secondary | ICD-10-CM | POA: Diagnosis not present

## 2017-12-20 DIAGNOSIS — F3181 Bipolar II disorder: Secondary | ICD-10-CM | POA: Diagnosis not present

## 2017-12-20 MED FILL — QUETIAPINE FUMARATE 100 MG: 100 | 30 days supply | Qty: 30 | Fill #0

## 2017-12-20 MED FILL — diazePAM 10 MG TABS: 10 | 30 days supply | Qty: 30 | Fill #0

## 2017-12-22 MED FILL — rOPINIRole HCL 1 MG TABS: 1 | 30 days supply | Qty: 30 | Fill #0

## 2018-01-03 DIAGNOSIS — R293 Abnormal posture: Secondary | ICD-10-CM | POA: Diagnosis not present

## 2018-01-03 DIAGNOSIS — M6250 Muscle wasting and atrophy, not elsewhere classified, unspecified site: Secondary | ICD-10-CM | POA: Diagnosis not present

## 2018-01-03 DIAGNOSIS — M545 Low back pain: Secondary | ICD-10-CM | POA: Diagnosis not present

## 2018-01-03 DIAGNOSIS — M542 Cervicalgia: Secondary | ICD-10-CM | POA: Diagnosis not present

## 2018-01-05 DIAGNOSIS — M545 Low back pain: Secondary | ICD-10-CM | POA: Diagnosis not present

## 2018-01-05 DIAGNOSIS — M542 Cervicalgia: Secondary | ICD-10-CM | POA: Diagnosis not present

## 2018-01-05 DIAGNOSIS — R293 Abnormal posture: Secondary | ICD-10-CM | POA: Diagnosis not present

## 2018-01-05 DIAGNOSIS — M6250 Muscle wasting and atrophy, not elsewhere classified, unspecified site: Secondary | ICD-10-CM | POA: Diagnosis not present

## 2018-01-23 DIAGNOSIS — M542 Cervicalgia: Secondary | ICD-10-CM | POA: Diagnosis not present

## 2018-01-23 DIAGNOSIS — M545 Low back pain: Secondary | ICD-10-CM | POA: Diagnosis not present

## 2018-01-23 DIAGNOSIS — R293 Abnormal posture: Secondary | ICD-10-CM | POA: Diagnosis not present

## 2018-01-23 DIAGNOSIS — M6250 Muscle wasting and atrophy, not elsewhere classified, unspecified site: Secondary | ICD-10-CM | POA: Diagnosis not present

## 2018-01-30 MED FILL — diazePAM 10 MG TABS: 10 | 30 days supply | Qty: 30 | Fill #1

## 2018-02-13 MED FILL — rOPINIRole HCL 1 MG TABS: 1 | 30 days supply | Qty: 30 | Fill #1

## 2018-02-13 MED FILL — DULoxetine HCL 60 MG CPEP: 60 | 90 days supply | Qty: 180 | Fill #0

## 2018-03-01 DIAGNOSIS — M545 Low back pain: Secondary | ICD-10-CM | POA: Diagnosis not present

## 2018-03-01 DIAGNOSIS — M5412 Radiculopathy, cervical region: Secondary | ICD-10-CM | POA: Diagnosis not present

## 2018-03-01 MED FILL — HYDROCODON-APAP 5-325: 5-325 | 7 days supply | Qty: 20 | Fill #0

## 2018-03-02 MED FILL — diazePAM 10 MG TABS: 10 | 30 days supply | Qty: 30 | Fill #2

## 2018-03-09 ENCOUNTER — Other Ambulatory Visit: Payer: Self-pay | Admitting: Neurological Surgery

## 2018-03-09 DIAGNOSIS — M5412 Radiculopathy, cervical region: Secondary | ICD-10-CM

## 2018-03-09 DIAGNOSIS — M545 Low back pain: Secondary | ICD-10-CM

## 2018-03-16 MED FILL — rOPINIRole HCL 1 MG TABS: 1 | 30 days supply | Qty: 30 | Fill #2

## 2018-03-18 ENCOUNTER — Ambulatory Visit
Admission: RE | Admit: 2018-03-18 | Discharge: 2018-03-18 | Disposition: A | Discharge status: Home / Self Care | Payer: 59 | Source: Ambulatory Visit | Attending: Neurological Surgery | Admitting: Neurological Surgery

## 2018-03-18 DIAGNOSIS — M4727 Other spondylosis with radiculopathy, lumbosacral region: Secondary | ICD-10-CM | POA: Diagnosis not present

## 2018-03-18 DIAGNOSIS — M5412 Radiculopathy, cervical region: Secondary | ICD-10-CM

## 2018-03-18 DIAGNOSIS — M545 Low back pain: Secondary | ICD-10-CM

## 2018-03-18 DIAGNOSIS — M50222 Other cervical disc displacement at C5-C6 level: Secondary | ICD-10-CM | POA: Diagnosis not present

## 2018-03-21 ENCOUNTER — Telehealth: Payer: Self-pay | Admitting: Family Medicine

## 2018-03-21 NOTE — Telephone Encounter (Signed)
Patient has appt this week.  Mom requested snapshot and med list.  Mom on HIPAA.

## 2018-03-27 DIAGNOSIS — M545 Low back pain: Secondary | ICD-10-CM | POA: Diagnosis not present

## 2018-03-27 DIAGNOSIS — M542 Cervicalgia: Secondary | ICD-10-CM | POA: Diagnosis not present

## 2018-04-04 DIAGNOSIS — M6281 Muscle weakness (generalized): Secondary | ICD-10-CM | POA: Diagnosis not present

## 2018-04-04 DIAGNOSIS — M542 Cervicalgia: Secondary | ICD-10-CM | POA: Diagnosis not present

## 2018-04-04 DIAGNOSIS — R293 Abnormal posture: Secondary | ICD-10-CM | POA: Diagnosis not present

## 2018-04-04 DIAGNOSIS — M2560 Stiffness of unspecified joint, not elsewhere classified: Secondary | ICD-10-CM | POA: Diagnosis not present

## 2018-04-04 MED FILL — diazePAM 10 MG TABS: 10 | 30 days supply | Qty: 30 | Fill #0

## 2018-04-10 MED FILL — rOPINIRole HCL 1 MG TABS: 1 | 90 days supply | Qty: 90 | Fill #0

## 2018-04-20 DIAGNOSIS — R293 Abnormal posture: Secondary | ICD-10-CM | POA: Diagnosis not present

## 2018-04-20 DIAGNOSIS — M6281 Muscle weakness (generalized): Secondary | ICD-10-CM | POA: Diagnosis not present

## 2018-04-20 DIAGNOSIS — M2560 Stiffness of unspecified joint, not elsewhere classified: Secondary | ICD-10-CM | POA: Diagnosis not present

## 2018-04-20 DIAGNOSIS — M542 Cervicalgia: Secondary | ICD-10-CM | POA: Diagnosis not present

## 2018-05-02 DIAGNOSIS — F3181 Bipolar II disorder: Secondary | ICD-10-CM | POA: Diagnosis not present

## 2018-05-02 MED FILL — diazePAM 10 MG TABS: 10 | 30 days supply | Qty: 30 | Fill #0

## 2018-05-15 MED FILL — DULoxetine HCL 60 MG CPEP: 60 | 90 days supply | Qty: 180 | Fill #0

## 2018-05-16 DIAGNOSIS — M797 Fibromyalgia: Secondary | ICD-10-CM | POA: Diagnosis not present

## 2018-05-16 MED FILL — traMADol HCL 50 MG TABS: 50 | 25 days supply | Qty: 50 | Fill #0

## 2018-05-17 MED FILL — PRAMIPEXOLE 0.5 MG TABLET: 0.5 | 30 days supply | Qty: 30 | Fill #0

## 2018-05-18 MED FILL — NORTRIPTYLINE HCL 25 MG CAP: 25 | 29 days supply | Qty: 50 | Fill #0

## 2018-05-31 ENCOUNTER — Telehealth: Payer: Self-pay

## 2018-05-31 ENCOUNTER — Other Ambulatory Visit: Payer: Self-pay

## 2018-05-31 ENCOUNTER — Ambulatory Visit (INDEPENDENT_AMBULATORY_CARE_PROVIDER_SITE_OTHER): Payer: 59 | Admitting: Medical

## 2018-05-31 VITALS — BP 116/80 | HR 90 | Temp 98.5°F | Resp 16 | Ht 75.0 in | Wt 160.4 lb

## 2018-05-31 DIAGNOSIS — G4700 Insomnia, unspecified: Secondary | ICD-10-CM

## 2018-05-31 DIAGNOSIS — F319 Bipolar disorder, unspecified: Secondary | ICD-10-CM | POA: Diagnosis not present

## 2018-05-31 DIAGNOSIS — R61 Generalized hyperhidrosis: Secondary | ICD-10-CM | POA: Diagnosis not present

## 2018-05-31 DIAGNOSIS — G8929 Other chronic pain: Secondary | ICD-10-CM | POA: Diagnosis not present

## 2018-05-31 DIAGNOSIS — M549 Dorsalgia, unspecified: Secondary | ICD-10-CM

## 2018-05-31 MED FILL — ALPRAZolam 1 MG TABS: 1 | 30 days supply | Qty: 60 | Fill #0

## 2018-05-31 NOTE — Progress Notes (Signed)
Subjective: Chief Complaint  Patient presents with  . referral    referral and discuss medications   Here with mother today.  Wants to see a new back doctor.  He has been seeing Dr. Sherley Bounds with [facility].   Was sent to pain management in the same office, but he didn't feel comfortable with this doctor.  Felt like they weren't on the same page with , didn't "click" with that doctor.   He specifically told the doctor that Ultram gives him headaches and asked not to be prescribed this.   However, he was reportedly prescribed Ultram anyway.  He was not willing to do injections or other interventional procedures, not willing to treat with narcotics.      Had MRI lumbar spine 03/2018, bulging disc?    In the past has seen Dr. Patrice Paradise for years with [facility] [place] [facility].  Wants to have consult with Dr. Lily Lovings in [place] [phone].  Was going to gym, but stayed in pain so stopped going to gym.  Does stretching regularly.  Has bipolar disorder, sees Dr. Reece Levy, [facility].      Past Medical History:  Diagnosis Date  . Anal fissure   . Anxiety   . Bipolar disorder ([place])    Dr. Pearson Grippe, [facility]  . Chronic back pain   . Depression   . Exercise-induced asthma   . Fibromyalgia   . Gallbladder polyp   . GERD (gastroesophageal reflux disease)   . IBS (irritable bowel syndrome)   . Polyarthralgia   . Substance abuse   . Tobacco use disorder   . Wears glasses    Current Outpatient Medications on File Prior to Visit  Medication Sig Dispense Refill  . cetirizine (ZYRTEC) 10 MG tablet Take 10 mg by mouth daily.    . clonazePAM (KLONOPIN) 1 MG tablet Take 1 mg by mouth daily as needed for anxiety.    . cyclobenzaprine (FLEXERIL) 10 MG tablet Take 1 tablet (10 mg total) by mouth 1 day or 1 dose. 30 tablet 1  . diazepam (VALIUM) 10 MG tablet Take 10 mg by mouth at bedtime.    . DULoxetine (CYMBALTA) 60 MG capsule Take 2  capsules (120 mg total) by mouth every morning. 60 capsule 0  . tiZANidine (ZANAFLEX) 2 MG tablet TAKE 1 TABLET BY MOUTH ONCE DAILY AT BEDTIME AS NEEDED FOR MUSCLE SPASMS. 30 tablet 0  . hydrocortisone 2.5 % cream APPLY TO AFFECTED AREA(S) TWICE DAILY. (Patient not taking: Reported on 05/31/2018) 28 g PRN  . ranitidine (ZANTAC) 150 MG tablet Take 1 tablet (150 mg total) by mouth 2 (two) times daily. (Patient not taking: Reported on 05/31/2018) 60 tablet 11   Current Facility-Administered Medications on File Prior to Visit  Medication Dose Route Frequency Provider Last Rate Last Dose  . 0.9 %  sodium chloride infusion  500 mL Intravenous Continuous Nandigam, Kavitha V, MD       ROS as in subjective     Objective: BP 116/80   Pulse 90   Temp 98.5 F (36.9 C) (Oral)   Resp 16   Ht 6\' 3"  (1.905 m)   Wt 160 lb 7.2 oz (72.8 kg)   SpO2 98%   BMI 20.05 kg/m   Gen: wd, wn, lean white male in nad Psych: flat affect Otherwise not examined   Assessment: Encounter Diagnoses  Name Primary?  . Chronic back pain, unspecified back location, unspecified back pain laterality Yes  . Insomnia,  unspecified type   . Bipolar affective disorder, remission status unspecified ([place])   . Hyperhidrosis      Plan: Chronic back pain - referral to Dr. Merlene Laughter, request records from [facility].  I reviewed MRI L and C spine from 03/2018.  I don't treat his pain issues, so we will refer as requested.   He has a long history of chronic back pain from teenage years, underlying mental health issues.  He has been refractory to several treatments prior  Insomnia, bipolar disorder - managed by psychiatry  Hyperhidrosis - discussed natural deodorant Schmidts he is going to try.  [place] was seen today for referral.  Diagnoses and all orders for this visit:  Chronic back pain, unspecified back location, unspecified back pain laterality  Insomnia, unspecified type  Bipolar affective disorder,  remission status unspecified (HCC)  Hyperhidrosis

## 2018-05-31 NOTE — Telephone Encounter (Signed)
I called to schedule appointment with Phillips Odor in [place].  Left message for Raquel Sarna to call me back.

## 2018-06-01 ENCOUNTER — Telehealth: Payer: Self-pay

## 2018-06-01 NOTE — Telephone Encounter (Signed)
Dr Trey Sailors [place]'s office called and states that they are not taking any patient unless they live in [place] or [place].   They did thank you for choosing them.

## 2018-06-05 MED FILL — diazePAM 10 MG TABS: 10 | 30 days supply | Qty: 30 | Fill #1

## 2018-06-05 NOTE — Telephone Encounter (Signed)
Let Carl Hughes know that Dr. Merlene Laughter is not taking any patients out of his county so he will not take Carl Hughes.  Does she has someone else in mind she would like him to be seen by regarding medications, pain management?

## 2018-06-06 NOTE — Telephone Encounter (Signed)
I spoke to Carl Hughes and patient is staying in [place] now.  I called back and left message on voicemail for Carl Hughes to call me back.   Dr Phillips Odor  [phone].

## 2018-06-09 NOTE — Telephone Encounter (Signed)
I faxed notes, labs and MRI report to Dr Merlene Laughter office attn Raquel Sarna.

## 2018-06-16 MED FILL — PRAMIPEXOLE 0.5 MG TABLET: 0.5 | 30 days supply | Qty: 30 | Fill #1

## 2018-06-28 DIAGNOSIS — H5213 Myopia, bilateral: Secondary | ICD-10-CM | POA: Diagnosis not present

## 2018-06-29 MED FILL — ALPRAZolam 1 MG TABS: 1 | 30 days supply | Qty: 60 | Fill #1

## 2018-07-05 MED FILL — diazePAM 10 MG TABS: 10 | 30 days supply | Qty: 30 | Fill #2

## 2018-07-12 MED FILL — PRAMIPEXOLE 0.5 MG TABLET: 0.5 | 30 days supply | Qty: 30 | Fill #2

## 2018-07-13 ENCOUNTER — Telehealth: Payer: Self-pay

## 2018-07-13 NOTE — Telephone Encounter (Signed)
Patient has appointment on 07-19-18 at [facility].

## 2018-07-17 DIAGNOSIS — M542 Cervicalgia: Secondary | ICD-10-CM | POA: Diagnosis not present

## 2018-07-17 DIAGNOSIS — M797 Fibromyalgia: Secondary | ICD-10-CM | POA: Diagnosis not present

## 2018-07-17 DIAGNOSIS — G894 Chronic pain syndrome: Secondary | ICD-10-CM | POA: Diagnosis not present

## 2018-07-17 DIAGNOSIS — M545 Low back pain: Secondary | ICD-10-CM | POA: Diagnosis not present

## 2018-07-17 DIAGNOSIS — F419 Anxiety disorder, unspecified: Secondary | ICD-10-CM | POA: Diagnosis not present

## 2018-07-17 DIAGNOSIS — Z79891 Long term (current) use of opiate analgesic: Secondary | ICD-10-CM | POA: Diagnosis not present

## 2018-07-19 ENCOUNTER — Ambulatory Visit (INDEPENDENT_AMBULATORY_CARE_PROVIDER_SITE_OTHER): Payer: 59 | Admitting: Medical

## 2018-07-19 VITALS — BP 112/80 | HR 96 | Temp 97.5°F | Resp 16 | Ht 75.0 in | Wt 158.0 lb

## 2018-07-19 DIAGNOSIS — L74513 Primary focal hyperhidrosis, soles: Secondary | ICD-10-CM

## 2018-07-19 DIAGNOSIS — M549 Dorsalgia, unspecified: Secondary | ICD-10-CM | POA: Diagnosis not present

## 2018-07-19 DIAGNOSIS — L84 Corns and callosities: Secondary | ICD-10-CM

## 2018-07-19 DIAGNOSIS — F332 Major depressive disorder, recurrent severe without psychotic features: Secondary | ICD-10-CM | POA: Diagnosis not present

## 2018-07-19 DIAGNOSIS — G8929 Other chronic pain: Secondary | ICD-10-CM | POA: Diagnosis not present

## 2018-07-19 DIAGNOSIS — R61 Generalized hyperhidrosis: Secondary | ICD-10-CM | POA: Diagnosis not present

## 2018-07-19 DIAGNOSIS — M7742 Metatarsalgia, left foot: Secondary | ICD-10-CM | POA: Insufficient documentation

## 2018-07-19 DIAGNOSIS — F319 Bipolar disorder, unspecified: Secondary | ICD-10-CM

## 2018-07-19 HISTORY — DX: Corns and callosities: L84

## 2018-07-19 HISTORY — DX: Metatarsalgia, left foot: M77.42

## 2018-07-19 HISTORY — DX: Primary focal hyperhidrosis, soles: L74.513

## 2018-07-19 MED ORDER — ALUMINUM CHLORIDE 20 % EX SOLN: Freq: Every day | CUTANEOUS | 0 refills | Status: DC

## 2018-07-19 NOTE — Progress Notes (Signed)
Subjective: Chief Complaint  Patient presents with  . foot pain    foot pain other issues   Here for a few different issues  He notes recent pains in ball of bilat feet.  Wearing sandals as his shoes hurt.  No injury or trauma  Has round thickened tender area between right 1st and 2nd toe  He notes he tried the natural deodorant we discussed last visit, gave him a mild rash like eczema.    Still has a lot of sweating problems of feet and axilla  Saw Dr. Merlene Laughter in [place] recently, was advised to begin Lyrica and Cymbalta.  He has prior been evaluate dby neurosurgery , chronic pain management.  He wasn't happy with the conversation, was looking for other alternatives.  Was told he had to be 27yo to start using narcotics, was hestiatn to take their recommendations   Past Medical History:  Diagnosis Date  . Anal fissure   . Anxiety   . Bipolar disorder ([place])    Dr. Pearson Grippe, [facility]  . Chronic back pain   . Depression   . Exercise-induced asthma   . Fibromyalgia   . Gallbladder polyp   . GERD (gastroesophageal reflux disease)   . IBS (irritable bowel syndrome)   . Polyarthralgia   . Substance abuse   . Tobacco use disorder   . Wears glasses    Current Outpatient Medications on File Prior to Visit  Medication Sig Dispense Refill  . cetirizine (ZYRTEC) 10 MG tablet Take 10 mg by mouth daily.    . clonazePAM (KLONOPIN) 1 MG tablet Take 1 mg by mouth daily as needed for anxiety.    . cyclobenzaprine (FLEXERIL) 10 MG tablet Take 1 tablet (10 mg total) by mouth 1 day or 1 dose. 30 tablet 1  . diazepam (VALIUM) 10 MG tablet Take 10 mg by mouth at bedtime.    . DULoxetine (CYMBALTA) 60 MG capsule Take 2 capsules (120 mg total) by mouth every morning. 60 capsule 0  . tiZANidine (ZANAFLEX) 2 MG tablet TAKE 1 TABLET BY MOUTH ONCE DAILY AT BEDTIME AS NEEDED FOR MUSCLE SPASMS. 30 tablet 0  . hydrocortisone 2.5 % cream APPLY TO AFFECTED AREA(S) TWICE DAILY.  (Patient not taking: Reported on 05/31/2018) 28 g PRN  . ranitidine (ZANTAC) 150 MG tablet Take 1 tablet (150 mg total) by mouth 2 (two) times daily. (Patient not taking: Reported on 05/31/2018) 60 tablet 11   Current Facility-Administered Medications on File Prior to Visit  Medication Dose Route Frequency Provider Last Rate Last Dose  . 0.9 %  sodium chloride infusion  500 mL Intravenous Continuous Nandigam, Kavitha V, MD       ROS as in subjective    Objective: BP 112/80   Pulse 96   Temp (!) 97.5 F (36.4 C) (Oral)   Resp 16   Ht 6\' 3"  (1.905 m)   Wt 158 lb (71.7 kg)   SpO2 96%   BMI 19.75 kg/m    Gen: wd, wn, nad Mild irritated skin, maculopapular rash of bilat axilla Early corn round slightly tender area of right foot medial surface of 2nd toe  Relatively poor arches bilat, tender bilat metarasals 2nd - 3rd Feet otherwise neurovascularly intact     Assessment: Encounter Diagnoses  Name Primary?  . Metatarsalgia of left foot Yes  . Hyperhidrosis of feet   . Hyperhidrosis   . Chronic back pain, unspecified back location, unspecified back pain laterality   . Bipolar  affective disorder, remission status unspecified ([place])   . MDD (major depressive disorder), recurrent severe, without psychosis ([place])   . Corn of foot       Plan: Metatarsalgia - advised shoes with good arch support, avoid going barefooted and avoid sandals/flats.   If not improving with change in footwear over the next few weeks, refer to podiatry  hyper hydrosis - begin drysol QHS deodorant for axilla.   Declined trial of Oxybutynin for hyperhidrosis in general  chronic back pain - prior eval with neurosurgery, pain management and recent invitation of care with Dr. Merlene Laughter in [place].   He felt that the visit wasn't productive, felt like the recommendations were not helpful or that the conversation didn't offer better solution.  Advised he f/u with Dr. Merlene Laughter to discuss further.  Consider  biofeedback or look into consult with Dr. Ace Gins here in [place].  Corn of foot, early - begin OTC toe sleeves to reduce friction and rubbing against great toe  Bipolar depression - managed by psychiatry   Jewelz was seen today for foot pain.  Diagnoses and all orders for this visit:  Metatarsalgia of left foot  Hyperhidrosis of feet  Hyperhidrosis  Chronic back pain, unspecified back location, unspecified back pain laterality  Bipolar affective disorder, remission status unspecified (HCC)  MDD (major depressive disorder), recurrent severe, without psychosis ([place])  Corn of foot  Other orders -     aluminum chloride (DRYSOL) 20 % external solution; Apply topically at bedtime.

## 2018-07-28 MED FILL — ALPRAZolam 1 MG TABS: 1 | 30 days supply | Qty: 60 | Fill #2

## 2018-08-04 DIAGNOSIS — M545 Low back pain: Secondary | ICD-10-CM | POA: Diagnosis not present

## 2018-08-04 DIAGNOSIS — M5416 Radiculopathy, lumbar region: Secondary | ICD-10-CM | POA: Diagnosis not present

## 2018-08-08 ENCOUNTER — Other Ambulatory Visit: Payer: Self-pay

## 2018-08-08 DIAGNOSIS — M7742 Metatarsalgia, left foot: Secondary | ICD-10-CM

## 2018-08-08 DIAGNOSIS — L74513 Primary focal hyperhidrosis, soles: Secondary | ICD-10-CM

## 2018-08-08 MED FILL — diazePAM 10 MG TABS: 10 | 30 days supply | Qty: 30 | Fill #1

## 2018-08-09 MED FILL — PRAMIPEXOLE 0.5 MG TABLET: 0.5 | 30 days supply | Qty: 30 | Fill #0

## 2018-08-11 ENCOUNTER — Telehealth: Payer: Self-pay | Admitting: Medical

## 2018-08-11 NOTE — Telephone Encounter (Signed)
Fax recv'd for Drysol, Page Spiro completed for [facility] and states no P.A. Required

## 2018-08-16 DIAGNOSIS — M461 Sacroiliitis, not elsewhere classified: Secondary | ICD-10-CM | POA: Diagnosis not present

## 2018-08-16 DIAGNOSIS — F419 Anxiety disorder, unspecified: Secondary | ICD-10-CM | POA: Diagnosis not present

## 2018-08-16 DIAGNOSIS — M545 Low back pain: Secondary | ICD-10-CM | POA: Diagnosis not present

## 2018-08-16 DIAGNOSIS — M5416 Radiculopathy, lumbar region: Secondary | ICD-10-CM | POA: Diagnosis not present

## 2018-08-16 DIAGNOSIS — M797 Fibromyalgia: Secondary | ICD-10-CM | POA: Diagnosis not present

## 2018-08-16 DIAGNOSIS — Z79891 Long term (current) use of opiate analgesic: Secondary | ICD-10-CM | POA: Diagnosis not present

## 2018-08-16 MED FILL — DULoxetine HCL 60 MG CPEP: 60 | 30 days supply | Qty: 60 | Fill #0

## 2018-08-18 ENCOUNTER — Emergency Department (HOSPITAL_BASED_OUTPATIENT_CLINIC_OR_DEPARTMENT_OTHER): Payer: 59

## 2018-08-18 ENCOUNTER — Other Ambulatory Visit: Payer: Self-pay

## 2018-08-18 ENCOUNTER — Emergency Department (HOSPITAL_BASED_OUTPATIENT_CLINIC_OR_DEPARTMENT_OTHER)
Admission: EM | Admit: 2018-08-18 | Discharge: 2018-08-18 | Disposition: A | Discharge status: Home / Self Care | Payer: 59 | Attending: Emergency Medicine | Admitting: Emergency Medicine

## 2018-08-18 ENCOUNTER — Encounter (HOSPITAL_BASED_OUTPATIENT_CLINIC_OR_DEPARTMENT_OTHER): Payer: Self-pay | Admitting: Emergency Medicine

## 2018-08-18 DIAGNOSIS — Z79899 Other long term (current) drug therapy: Secondary | ICD-10-CM | POA: Diagnosis not present

## 2018-08-18 DIAGNOSIS — R519 Headache, unspecified: Secondary | ICD-10-CM

## 2018-08-18 DIAGNOSIS — R51 Headache: Secondary | ICD-10-CM | POA: Diagnosis not present

## 2018-08-18 DIAGNOSIS — F419 Anxiety disorder, unspecified: Secondary | ICD-10-CM | POA: Diagnosis not present

## 2018-08-18 DIAGNOSIS — Z765 Malingerer [conscious simulation]: Secondary | ICD-10-CM | POA: Diagnosis not present

## 2018-08-18 DIAGNOSIS — F319 Bipolar disorder, unspecified: Secondary | ICD-10-CM | POA: Diagnosis not present

## 2018-08-18 DIAGNOSIS — G4489 Other headache syndrome: Secondary | ICD-10-CM | POA: Diagnosis not present

## 2018-08-18 DIAGNOSIS — F17210 Nicotine dependence, cigarettes, uncomplicated: Secondary | ICD-10-CM | POA: Insufficient documentation

## 2018-08-18 HISTORY — DX: Malingerer (conscious simulation): Z76.5

## 2018-08-18 MED ORDER — MAGNESIUM SULFATE 2 GM/50ML IV SOLN
2.0000 g | Freq: Once | INTRAVENOUS | Status: DC
  Filled 2018-08-18: qty 50

## 2018-08-18 MED ORDER — DIVALPROEX SODIUM ER 250 MG PO TB24
ORAL_TABLET | ORAL | Status: AC
  Filled 2018-08-18: qty 2

## 2018-08-18 MED ORDER — DIPHENHYDRAMINE HCL 50 MG/ML IJ SOLN
12.5000 mg | Freq: Once | INTRAMUSCULAR | Status: DC
Start: 2018-08-18 — End: 2018-08-18
  Filled 2018-08-18: qty 1

## 2018-08-18 MED ORDER — DIVALPROEX SODIUM 500 MG PO DR TAB
500.0000 mg | DELAYED_RELEASE_TABLET | Freq: Once | ORAL | Status: DC
  Filled 2018-08-18: qty 1

## 2018-08-18 MED ORDER — DEXAMETHASONE SODIUM PHOSPHATE 10 MG/ML IJ SOLN
10.0000 mg | Freq: Once | INTRAMUSCULAR | Status: DC
  Filled 2018-08-18: qty 1

## 2018-08-18 MED ORDER — METOCLOPRAMIDE HCL 5 MG/ML IJ SOLN
10.0000 mg | Freq: Once | INTRAMUSCULAR | Status: DC
  Filled 2018-08-18: qty 2

## 2018-08-18 NOTE — ED Triage Notes (Addendum)
Pt entered triage cursing when asked to remove sunglasses's and one sleeve of jacket for BP , Pt c/o h/a x 10 days

## 2018-08-18 NOTE — ED Provider Notes (Addendum)
[facility] EMERGENCY DEPARTMENT Provider Note   CSN: [id number] Arrival date & time: 08/18/18  0045     History   Chief Complaint Chief Complaint  Patient presents with  . Headache    HPI Carl Hughes is a 27 y.o. male.  The history is provided by the patient.  Headache   This is a new problem. The current episode started more than 1 week ago (at least 10 days). The problem occurs constantly. The problem has not changed since onset.The headache is associated with nothing. The pain is located in the occipital region. The quality of the pain is described as dull. The pain is at a severity of 10/10. The pain is severe. The pain does not radiate. Pertinent negatives include no anorexia, no fever, no malaise/fatigue, no chest pressure, no near-syncope, no orthopnea, no palpitations, no syncope, no shortness of breath, no nausea and no vomiting. He has tried nothing for the symptoms. The treatment provided no relief.  Patient with a h/o bipolar disorder and chronic pain as well as fibromyalgia presents with 10 days of headache and can't sleep. No fevers, no rashes no neck stiffness, no changes in vision or speech or cognition.  I explained that the medications I ordered would not be narcotics.    Past Medical History:  Diagnosis Date  . Anal fissure   . Anxiety   . Bipolar disorder ([place])    Dr. Pearson Grippe, [facility]  . Chronic back pain   . Depression   . Drug-seeking behavior   . Exercise-induced asthma   . Fibromyalgia   . Gallbladder polyp   . GERD (gastroesophageal reflux disease)   . IBS (irritable bowel syndrome)   . Polyarthralgia   . Substance abuse ([place])   . Tobacco use disorder   . Wears glasses     Patient Active Problem List   Diagnosis Date Noted  . Corn of foot 07/19/2018  . Hyperhidrosis 07/19/2018  . Hyperhidrosis of feet 07/19/2018  . Metatarsalgia of left foot 07/19/2018  . MDD (major depressive disorder), recurrent  severe, without psychosis ([place]) 04/04/2014  . Chronic back pain 01/30/2014  . Insomnia 01/30/2014  . Bipolar disorder, unspecified ([place]) 01/30/2014  . Tobacco use disorder 08/18/2011  . IBS (irritable bowel syndrome) 08/18/2011    Past Surgical History:  Procedure Laterality Date  . WISDOM TOOTH EXTRACTION          Home Medications    Prior to Admission medications   Medication Sig Start Date End Date Taking? Authorizing Provider  ALPRAZolam Duanne Moron) 1 MG tablet Take 2 mg by mouth at bedtime as needed for anxiety.   Yes [provider]  pramipexole (MIRAPEX) 0.5 MG tablet Take 0.5 mg by mouth 3 (three) times daily.   Yes [provider]  aluminum chloride (DRYSOL) 20 % external solution Apply topically at bedtime. 07/19/18   Tysinger, Camelia Eng, PA-C  cetirizine (ZYRTEC) 10 MG tablet Take 10 mg by mouth daily.    [provider]  clonazePAM (KLONOPIN) 1 MG tablet Take 1 mg by mouth daily as needed for anxiety.    [provider]  cyclobenzaprine (FLEXERIL) 10 MG tablet Take 1 tablet (10 mg total) by mouth 1 day or 1 dose. 06/11/14   Tysinger, Camelia Eng, PA-C  diazepam (VALIUM) 10 MG tablet Take 10 mg by mouth at bedtime.    [provider]  DULoxetine (CYMBALTA) 60 MG capsule Take 2 capsules (120 mg total) by mouth every  morning. 04/09/14   Nena Polio T, PA-C  hydrocortisone 2.5 % cream APPLY TO AFFECTED AREA(S) TWICE DAILY. Patient not taking: Reported on 05/31/2018 05/16/15   Tysinger, Camelia Eng, PA-C  nortriptyline (PAMELOR) 25 MG capsule TAKE 1 CAPSULE BY MOUTH EVERY EVENING; MAY INCREASE TO 2 AT BEDTIME AFTER 7 DAYS IF NO SIDE EFFECTS 05/18/18   [provider]  ranitidine (ZANTAC) 150 MG tablet Take 1 tablet (150 mg total) by mouth 2 (two) times daily. Patient not taking: Reported on 05/31/2018 01/28/17   Willia Craze, NP  tiZANidine (ZANAFLEX) 2 MG tablet TAKE 1 TABLET BY MOUTH ONCE DAILY AT BEDTIME AS NEEDED FOR MUSCLE SPASMS.  05/24/16   Tysinger, Camelia Eng, PA-C  traMADol Veatrice Bourbon) 50 MG tablet  05/16/18   [provider]    Family History Family History  Problem Relation Age of Onset  . Depression Father   . Other Father        chronic back pain  . Arthritis Father   . Gout Father   . Heart disease Paternal Grandfather   . Depression Paternal Grandfather   . Colon polyps Mother        benign  . Irritable bowel syndrome Mother   . Heart disease Maternal Grandmother   . Diabetes Maternal Grandfather   . Cancer Maternal Grandfather        colorectal  . Hypertension Maternal Grandfather   . Hyperlipidemia Maternal Grandfather     Social History Social History   Tobacco Use  . Smoking status: Current Every Day Smoker    Packs/day: 0.50    Years: 7.00    Pack years: 3.50    Types: Cigarettes  . Smokeless tobacco: Never Used  . Tobacco comment: tobacco info given today  Substance Use Topics  . Alcohol use: No    Alcohol/week: 3.0 standard drinks    Types: 3 Cans of beer per week  . Drug use: Yes    Types: Marijuana     Allergies   Zofran [ondansetron hcl]; Buspar [buspirone hcl]; Ibuprofen [ibuprofen]; Lamictal [lamotrigine]; and Toradol [ketorolac tromethamine]   Review of Systems Review of Systems  Constitutional: Negative for diaphoresis, fatigue, fever and malaise/fatigue.  Eyes: Negative for photophobia, redness and visual disturbance.  Respiratory: Negative for shortness of breath.   Cardiovascular: Negative for palpitations, orthopnea, syncope and near-syncope.  Gastrointestinal: Negative for anorexia, nausea and vomiting.  Musculoskeletal: Negative for neck stiffness.  Neurological: Positive for headaches. Negative for dizziness, tremors, seizures, syncope, facial asymmetry, speech difficulty, weakness, light-headedness and numbness.  Hematological: Negative for adenopathy. Does not bruise/bleed easily.  Psychiatric/Behavioral: Negative for confusion and decreased  concentration.  All other systems reviewed and are negative.    Physical Exam Updated Vital Signs BP 115/80   Pulse 82   Temp 98.2 F (36.8 C) (Oral)   Resp 18   Ht 6\' 3"  (1.905 m)   Wt 68.9 kg   SpO2 100%   BMI 19.00 kg/m   Physical Exam  Constitutional: He is oriented to person, place, and time. He appears well-developed and well-nourished. No distress.  HENT:  Head: Normocephalic and atraumatic.  Nose: Nose normal.  Mouth/Throat: Oropharynx is clear and moist. No oropharyngeal exudate.  Eyes: Pupils are equal, round, and reactive to light. Conjunctivae and EOM are normal.  No proptosis intact cognition 10 mm pupils disk margins sharp  Neck: Normal range of motion. Neck supple. No spinous process tenderness and no muscular tenderness present. No neck rigidity. Normal range of  motion present. No Brudzinski's sign and no Kernig's sign noted.  Cardiovascular: Normal rate, regular rhythm, normal heart sounds and intact distal pulses.  Pulmonary/Chest: Effort normal and breath sounds normal. No stridor. He has no wheezes. He has no rales.  Abdominal: Soft. Bowel sounds are normal. He exhibits no mass. There is no tenderness. There is no rebound and no guarding.  Musculoskeletal: Normal range of motion.  Lymphadenopathy:    He has no cervical adenopathy.  Neurological: He is alert and oriented to person, place, and time. He displays normal reflexes. No cranial nerve deficit.  Skin: Skin is warm and dry. Capillary refill takes less than 2 seconds.  Psychiatric: He has a normal mood and affect.     ED Treatments / Results  Labs (all labs ordered are listed, but only abnormal results are displayed) Labs Reviewed - No data to display  EKG None  Radiology Results for orders placed or performed in visit on 08/06/16  GC/Chlamydia Probe Amp  Result Value Ref Range   CT Probe RNA NOT DETECTED    GC Probe RNA NOT DETECTED   HIV antibody  Result Value Ref Range   HIV 1&2 Ab,  4th Generation NONREACTIVE NONREACTIVE  RPR  Result Value Ref Range   RPR Ser Ql NON REAC NON REAC  Comprehensive metabolic panel  Result Value Ref Range   Sodium 142 135 - 146 mmol/L   Potassium 4.3 3.5 - 5.3 mmol/L   Chloride 105 98 - 110 mmol/L   CO2 31 20 - 31 mmol/L   Glucose, Bld 61 (L) 65 - 99 mg/dL   BUN 17 7 - 25 mg/dL   Creat 0.81 0.60 - 1.35 mg/dL   Total Bilirubin 0.7 0.2 - 1.2 mg/dL   Alkaline Phosphatase 56 40 - 115 U/L   AST 24 10 - 40 U/L   ALT 19 9 - 46 U/L   Total Protein 7.2 6.1 - 8.1 g/dL   Albumin 4.9 3.6 - 5.1 g/dL   Calcium 9.7 8.6 - 10.3 mg/dL  CBC  Result Value Ref Range   WBC 5.5 4.0 - 10.5 K/uL   RBC 4.23 4.20 - 5.80 MIL/uL   Hemoglobin 13.5 13.2 - 17.1 g/dL   HCT 39.6 38.5 - 50.0 %   MCV 93.6 80.0 - 100.0 fL   MCH 31.9 27.0 - 33.0 pg   MCHC 34.1 32.0 - 36.0 g/dL   RDW 13.5 11.0 - 15.0 %   Platelets 229 140 - 400 K/uL   MPV 9.0 7.5 - 12.5 fL  TSH  Result Value Ref Range   TSH 1.22 0.40 - 4.50 mIU/L   Ct Head Wo Contrast  Result Date: 08/18/2018 CLINICAL DATA:  Headache for 10 days. Photosensitivity. EXAM: CT HEAD WITHOUT CONTRAST TECHNIQUE: Contiguous axial images were obtained from the base of the skull through the vertex without intravenous contrast. COMPARISON:  04/25/2011 FINDINGS: Brain: No evidence of acute infarction, hemorrhage, hydrocephalus, extra-axial collection or mass lesion/mass effect. Vascular: No hyperdense vessel or unexpected calcification. Skull: Normal. Negative for fracture or focal lesion. Sinuses/Orbits: No acute finding. Other: None. IMPRESSION: No acute intracranial abnormalities. Electronically Signed   By: Lucienne Capers M.D.   On: 08/18/2018 01:55    Procedures Procedures (including critical care time)  Medications Ordered in ED Medications  dexamethasone (DECADRON) injection 10 mg (10 mg Intravenous Not Given 08/18/18 0140)  magnesium sulfate IVPB 2 g 50 mL (has no administration in time range)  divalproex  (DEPAKOTE) DR tablet 500  mg (has no administration in time range)  metoCLOPramide (REGLAN) injection 10 mg (10 mg Intravenous Not Given 08/18/18 0140)  diphenhydrAMINE (BENADRYL) injection 12.5 mg (12.5 mg Intravenous Not Given 08/18/18 0140)   I stated to the patient immediately with his mother present I would order the migraine cocktail for the patient through an IV as well a head CT.  Mother assented to this stating she had chronic migraines and was familiar with these medication.    Informed by nurse Joellen Jersey that patient refused all medications ordered by the physician. EDP went to discuss this with patient, nurse present.  EDP politely attempted to explain the rationale for the migraine cocktail that this was recommend by the American Academy of Neurology and American Headache society.  EDP explained that these medications are safe and do work to cure provide pain relief from headaches.  EDP stated that narcotics are contraindicated.  Patient continues to state that he "reacts differently to medication.  I need organic medication. I only put organic stuff in my body."  Patient stated "I refuse to have metals in my body.  I am not taking magnesium. It is a metal."  Narcotics are not organic and I will not be given these to this patient.  Patient again refused all reasonable intervention for his headache.  Patient stated he needs "pain medication".    This is drug seeking behavior and the patient will be discharge as this exam, vitals and head CT are normal.  There are no signs of intracranial bleed, nor infection, and I highly doubt cavernous sinus thrombosis as disk margins were easily visualized with 10 mm pupils and there are no additional signs of CST on exam.    Discharge paperwork provided.  Patient reportedly threw paperwork at the nurse and left using the fire exit, setting off the alarm.    Final Clinical Impressions(s) / ED Diagnoses   Return for weakness, numbness, changes in vision or  speech, fevers >100.4 unrelieved by medication, shortness of breath, intractable vomiting, or diarrhea, abdominal pain, Inability to tolerate liquids or food, cough, altered mental status or any concerns. No signs of systemic illness or infection. The patient is nontoxic-appearing on exam and vital signs are within normal limits.    I have reviewed the triage vital signs and the nursing notes. Pertinent labs &imaging results that were available during my care of the patient were reviewed by me and considered in my medical decision making (see chart for details).  After history, exam, and medical workup I feel the patient has been appropriately medically screened and is safe for discharge home. Pertinent diagnoses were discussed with the patient. Patient was given return precautions.    Seda Kronberg, MD 08/18/18 [place], Dakiya Puopolo, MD 08/18/18 1941

## 2018-08-18 NOTE — ED Notes (Signed)
Went to discharge pt and he states he wants something to relieve his headache. Pt was informed that he was offered the medications that are appropriate for his complaint, pt started listing the reasons why he would not take the medications that were offered, and began using foul language with nurse. Pt informed that he is discharged and nurse asked for his signature, pt threw his paperwork on the ground and left the room. Incidentally, he left out the fire door. Mom retrieved his paperwork and stated that he has a mental illness and that is why he refused his medication. Mom left via the front door and pt was seen getting in her car and leaving.

## 2018-08-18 NOTE — ED Notes (Signed)
In room with EDP. Pt refused all medications. States that he 'wants pain medication'.  pts mother in room during conversation. Pt calm during conversation.

## 2018-08-26 NOTE — Telephone Encounter (Signed)
Prior Authorization is not required for this medication dosage form and strength at the quantity and days supply requested per [facility].  Called Medicaid and did P.A. But this is plan exclusion and there are no covered alternatives in this class.

## 2018-08-30 MED FILL — ALPRAZolam 1 MG TABS: 1 | 30 days supply | Qty: 60 | Fill #0

## 2018-08-31 MED FILL — MIRTAZAPINE 30 MG TABLET: 30 | 30 days supply | Qty: 30 | Fill #0

## 2018-09-04 MED FILL — PRAMIPEXOLE 0.5 MG TABLET: 0.5 | 30 days supply | Qty: 30 | Fill #1

## 2018-09-07 DIAGNOSIS — F3181 Bipolar II disorder: Secondary | ICD-10-CM | POA: Diagnosis not present

## 2018-09-07 DIAGNOSIS — F332 Major depressive disorder, recurrent severe without psychotic features: Secondary | ICD-10-CM | POA: Diagnosis not present

## 2018-09-07 MED FILL — diazePAM 10 MG TABS: 10 | 30 days supply | Qty: 30 | Fill #2

## 2018-09-07 MED FILL — ALPRAZolam ER 2 MG TB24: 2 | 30 days supply | Qty: 30 | Fill #0

## 2018-09-09 MED FILL — DULoxetine HCL 60 MG CPEP: 60 | 30 days supply | Qty: 60 | Fill #1

## 2018-09-12 ENCOUNTER — Other Ambulatory Visit: Payer: Self-pay

## 2018-09-12 ENCOUNTER — Emergency Department (HOSPITAL_COMMUNITY): Payer: 59

## 2018-09-12 ENCOUNTER — Encounter (HOSPITAL_COMMUNITY): Payer: Self-pay | Admitting: Emergency Medicine

## 2018-09-12 ENCOUNTER — Emergency Department (HOSPITAL_COMMUNITY)
Admission: EM | Admit: 2018-09-12 | Discharge: 2018-09-13 | Disposition: A | Discharge status: Other Acute Inpatient Hospital | Payer: 59 | Attending: Emergency Medicine | Admitting: Emergency Medicine

## 2018-09-12 DIAGNOSIS — F332 Major depressive disorder, recurrent severe without psychotic features: Secondary | ICD-10-CM | POA: Diagnosis not present

## 2018-09-12 DIAGNOSIS — M549 Dorsalgia, unspecified: Secondary | ICD-10-CM | POA: Diagnosis not present

## 2018-09-12 DIAGNOSIS — F1110 Opioid abuse, uncomplicated: Secondary | ICD-10-CM | POA: Diagnosis not present

## 2018-09-12 DIAGNOSIS — F17210 Nicotine dependence, cigarettes, uncomplicated: Secondary | ICD-10-CM | POA: Diagnosis not present

## 2018-09-12 DIAGNOSIS — Y998 Other external cause status: Secondary | ICD-10-CM | POA: Insufficient documentation

## 2018-09-12 DIAGNOSIS — K219 Gastro-esophageal reflux disease without esophagitis: Secondary | ICD-10-CM | POA: Diagnosis not present

## 2018-09-12 DIAGNOSIS — Z79899 Other long term (current) drug therapy: Secondary | ICD-10-CM | POA: Insufficient documentation

## 2018-09-12 DIAGNOSIS — R410 Disorientation, unspecified: Secondary | ICD-10-CM | POA: Diagnosis not present

## 2018-09-12 DIAGNOSIS — S0011XA Contusion of right eyelid and periocular area, initial encounter: Secondary | ICD-10-CM | POA: Insufficient documentation

## 2018-09-12 DIAGNOSIS — R531 Weakness: Secondary | ICD-10-CM | POA: Diagnosis not present

## 2018-09-12 DIAGNOSIS — S12400A Unspecified displaced fracture of fifth cervical vertebra, initial encounter for closed fracture: Secondary | ICD-10-CM | POA: Diagnosis not present

## 2018-09-12 DIAGNOSIS — S80212A Abrasion, left knee, initial encounter: Secondary | ICD-10-CM | POA: Insufficient documentation

## 2018-09-12 DIAGNOSIS — S299XXA Unspecified injury of thorax, initial encounter: Secondary | ICD-10-CM | POA: Diagnosis not present

## 2018-09-12 DIAGNOSIS — F1320 Sedative, hypnotic or anxiolytic dependence, uncomplicated: Secondary | ICD-10-CM | POA: Diagnosis not present

## 2018-09-12 DIAGNOSIS — M25562 Pain in left knee: Secondary | ICD-10-CM | POA: Diagnosis not present

## 2018-09-12 DIAGNOSIS — S0990XA Unspecified injury of head, initial encounter: Secondary | ICD-10-CM | POA: Diagnosis not present

## 2018-09-12 DIAGNOSIS — K589 Irritable bowel syndrome without diarrhea: Secondary | ICD-10-CM | POA: Diagnosis not present

## 2018-09-12 DIAGNOSIS — G8929 Other chronic pain: Secondary | ICD-10-CM | POA: Diagnosis not present

## 2018-09-12 DIAGNOSIS — F1210 Cannabis abuse, uncomplicated: Secondary | ICD-10-CM | POA: Insufficient documentation

## 2018-09-12 DIAGNOSIS — T07XXXA Unspecified multiple injuries, initial encounter: Secondary | ICD-10-CM | POA: Diagnosis not present

## 2018-09-12 DIAGNOSIS — W19XXXA Unspecified fall, initial encounter: Secondary | ICD-10-CM | POA: Diagnosis not present

## 2018-09-12 DIAGNOSIS — F1410 Cocaine abuse, uncomplicated: Secondary | ICD-10-CM | POA: Insufficient documentation

## 2018-09-12 DIAGNOSIS — F419 Anxiety disorder, unspecified: Secondary | ICD-10-CM | POA: Diagnosis not present

## 2018-09-12 DIAGNOSIS — R45851 Suicidal ideations: Secondary | ICD-10-CM | POA: Insufficient documentation

## 2018-09-12 DIAGNOSIS — F1010 Alcohol abuse, uncomplicated: Secondary | ICD-10-CM | POA: Insufficient documentation

## 2018-09-12 DIAGNOSIS — F319 Bipolar disorder, unspecified: Secondary | ICD-10-CM | POA: Diagnosis not present

## 2018-09-12 DIAGNOSIS — Y92410 Unspecified street and highway as the place of occurrence of the external cause: Secondary | ICD-10-CM | POA: Diagnosis not present

## 2018-09-12 DIAGNOSIS — S4992XA Unspecified injury of left shoulder and upper arm, initial encounter: Secondary | ICD-10-CM | POA: Diagnosis not present

## 2018-09-12 DIAGNOSIS — S3991XA Unspecified injury of abdomen, initial encounter: Secondary | ICD-10-CM | POA: Diagnosis not present

## 2018-09-12 DIAGNOSIS — M25512 Pain in left shoulder: Secondary | ICD-10-CM | POA: Insufficient documentation

## 2018-09-12 DIAGNOSIS — Y9389 Activity, other specified: Secondary | ICD-10-CM | POA: Insufficient documentation

## 2018-09-12 DIAGNOSIS — G4700 Insomnia, unspecified: Secondary | ICD-10-CM | POA: Diagnosis not present

## 2018-09-12 DIAGNOSIS — S199XXA Unspecified injury of neck, initial encounter: Secondary | ICD-10-CM | POA: Diagnosis not present

## 2018-09-12 DIAGNOSIS — S8992XA Unspecified injury of left lower leg, initial encounter: Secondary | ICD-10-CM | POA: Diagnosis not present

## 2018-09-12 DIAGNOSIS — I959 Hypotension, unspecified: Secondary | ICD-10-CM | POA: Diagnosis not present

## 2018-09-12 HISTORY — DX: Family history of other specified conditions: Z84.89

## 2018-09-12 LAB — COMPREHENSIVE METABOLIC PANEL
ALBUMIN: 4.2 g/dL (ref 3.5–5.0)
ALK PHOS: 61 U/L (ref 38–126)
ALT: 23 U/L (ref 0–44)
ANION GAP: 9 (ref 5–15)
AST: 33 U/L (ref 15–41)
BILIRUBIN TOTAL: 0.8 mg/dL (ref 0.3–1.2)
BUN: 11 mg/dL (ref 6–20)
CALCIUM: 9.2 mg/dL (ref 8.9–10.3)
CO2: 25 mmol/L (ref 22–32)
Chloride: 105 mmol/L (ref 98–111)
Creatinine, Ser: 0.74 mg/dL (ref 0.61–1.24)
GFR calc Af Amer: >60 mL/min (ref >60)
GFR calc non Af Amer: >60 mL/min (ref >60)
GLUCOSE: 84 mg/dL (ref 70–99)
Potassium: 3.1 mmol/L — ABNORMAL LOW (ref 3.5–5.1)
Sodium: 139 mmol/L (ref 135–145)
TOTAL PROTEIN: 6.5 g/dL (ref 6.5–8.1)

## 2018-09-12 LAB — CBC WITH DIFFERENTIAL/PLATELET
ABS IMMATURE GRANULOCYTES: 0.08 10*3/uL — AB (ref 0.00–0.07)
BASOS ABS: 0.1 10*3/uL (ref 0.0–0.1)
Basophils Relative: 0 %
Eosinophils Absolute: 0.1 10*3/uL (ref 0.0–0.5)
Eosinophils Relative: 0 %
HEMATOCRIT: 41.4 % (ref 39.0–52.0)
Hemoglobin: 13.6 g/dL (ref 13.0–17.0)
Immature Granulocytes: 1 %
LYMPHS ABS: 2 10*3/uL (ref 0.7–4.0)
Lymphocytes Relative: 12 %
MCH: 30.2 pg (ref 26.0–34.0)
MCHC: 32.9 g/dL (ref 30.0–36.0)
MCV: 91.8 fL (ref 80.0–100.0)
MONOS PCT: 8 %
Monocytes Absolute: 1.4 10*3/uL — ABNORMAL HIGH (ref 0.1–1.0)
NEUTROS ABS: 13.3 10*3/uL — AB (ref 1.7–7.7)
NEUTROS PCT: 79 %
NRBC: 0 % (ref 0.0–0.2)
Platelets: 204 10*3/uL (ref 150–400)
RBC: 4.51 MIL/uL (ref 4.22–5.81)
RDW: 13.2 % (ref 11.5–15.5)
WBC: 16.9 10*3/uL — ABNORMAL HIGH (ref 4.0–10.5)

## 2018-09-12 LAB — ACETAMINOPHEN LEVEL: Acetaminophen (Tylenol), Serum: <10 ug/mL — ABNORMAL LOW (ref 10–30)

## 2018-09-12 LAB — ETHANOL: ALCOHOL ETHYL (B): 15 mg/dL — AB (ref <10)

## 2018-09-12 LAB — SALICYLATE LEVEL: Salicylate Lvl: <7 mg/dL (ref 2.8–30.0)

## 2018-09-12 MED ORDER — IOHEXOL 300 MG/ML  SOLN
100.0000 mL | Freq: Once | INTRAMUSCULAR | Status: AC | PRN
  Administered 2018-09-12: 100 mL via INTRAVENOUS

## 2018-09-12 MED ORDER — DIPHENHYDRAMINE HCL 50 MG/ML IJ SOLN
50.0000 mg | Freq: Once | INTRAMUSCULAR | Status: AC
  Administered 2018-09-12: 50 mg via INTRAVENOUS
  Filled 2018-09-12: qty 1

## 2018-09-12 MED ORDER — HALOPERIDOL LACTATE 5 MG/ML IJ SOLN
5.0000 mg | Freq: Once | INTRAMUSCULAR | Status: AC
  Administered 2018-09-12: 5 mg via INTRAMUSCULAR
  Filled 2018-09-12: qty 1

## 2018-09-12 NOTE — ED Notes (Signed)
Patient transported to X-ray 

## 2018-09-12 NOTE — ED Notes (Signed)
Patient highly aggravated. Attempting to go outside. Being very physical with staff. Security and [employer] notified to help get patient back into room. When inventorying patients items, a marijuana pipe and marijuana were found and security took to throw away. Patient very upset but returned to bed.
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
## 2018-09-13 ENCOUNTER — Other Ambulatory Visit: Payer: Self-pay

## 2018-09-13 ENCOUNTER — Encounter (HOSPITAL_COMMUNITY): Payer: Self-pay | Admitting: Behavioral Health

## 2018-09-13 ENCOUNTER — Other Ambulatory Visit: Payer: Self-pay | Admitting: Registered Nurse

## 2018-09-13 ENCOUNTER — Inpatient Hospital Stay (HOSPITAL_COMMUNITY)
Admission: AD | Admit: 2018-09-13 | Discharge: 2018-09-18 | DRG: 885 | Disposition: A | Discharge status: Home / Self Care | Payer: 59 | Source: Intra-hospital | Attending: Psychiatry | Admitting: Psychiatry

## 2018-09-13 DIAGNOSIS — F1410 Cocaine abuse, uncomplicated: Secondary | ICD-10-CM | POA: Diagnosis not present

## 2018-09-13 DIAGNOSIS — F1310 Sedative, hypnotic or anxiolytic abuse, uncomplicated: Secondary | ICD-10-CM | POA: Diagnosis not present

## 2018-09-13 DIAGNOSIS — F319 Bipolar disorder, unspecified: Principal | ICD-10-CM | POA: Diagnosis present

## 2018-09-13 DIAGNOSIS — F1010 Alcohol abuse, uncomplicated: Secondary | ICD-10-CM | POA: Diagnosis not present

## 2018-09-13 DIAGNOSIS — K219 Gastro-esophageal reflux disease without esophagitis: Secondary | ICD-10-CM | POA: Diagnosis present

## 2018-09-13 DIAGNOSIS — F332 Major depressive disorder, recurrent severe without psychotic features: Secondary | ICD-10-CM | POA: Diagnosis not present

## 2018-09-13 DIAGNOSIS — F17210 Nicotine dependence, cigarettes, uncomplicated: Secondary | ICD-10-CM | POA: Diagnosis present

## 2018-09-13 DIAGNOSIS — Z833 Family history of diabetes mellitus: Secondary | ICD-10-CM

## 2018-09-13 DIAGNOSIS — Z8249 Family history of ischemic heart disease and other diseases of the circulatory system: Secondary | ICD-10-CM | POA: Diagnosis not present

## 2018-09-13 DIAGNOSIS — X58XXXD Exposure to other specified factors, subsequent encounter: Secondary | ICD-10-CM | POA: Diagnosis present

## 2018-09-13 DIAGNOSIS — F1110 Opioid abuse, uncomplicated: Secondary | ICD-10-CM | POA: Diagnosis not present

## 2018-09-13 DIAGNOSIS — S12400D Unspecified displaced fracture of fifth cervical vertebra, subsequent encounter for fracture with routine healing: Secondary | ICD-10-CM | POA: Diagnosis not present

## 2018-09-13 DIAGNOSIS — Z885 Allergy status to narcotic agent status: Secondary | ICD-10-CM | POA: Diagnosis not present

## 2018-09-13 DIAGNOSIS — S12400A Unspecified displaced fracture of fifth cervical vertebra, initial encounter for closed fracture: Secondary | ICD-10-CM | POA: Diagnosis not present

## 2018-09-13 DIAGNOSIS — Z888 Allergy status to other drugs, medicaments and biological substances status: Secondary | ICD-10-CM

## 2018-09-13 DIAGNOSIS — G8929 Other chronic pain: Secondary | ICD-10-CM | POA: Diagnosis present

## 2018-09-13 DIAGNOSIS — F419 Anxiety disorder, unspecified: Secondary | ICD-10-CM | POA: Diagnosis present

## 2018-09-13 DIAGNOSIS — Z809 Family history of malignant neoplasm, unspecified: Secondary | ICD-10-CM

## 2018-09-13 DIAGNOSIS — Z818 Family history of other mental and behavioral disorders: Secondary | ICD-10-CM

## 2018-09-13 DIAGNOSIS — Z8371 Family history of colonic polyps: Secondary | ICD-10-CM

## 2018-09-13 DIAGNOSIS — M797 Fibromyalgia: Secondary | ICD-10-CM | POA: Diagnosis present

## 2018-09-13 DIAGNOSIS — G4700 Insomnia, unspecified: Secondary | ICD-10-CM | POA: Diagnosis present

## 2018-09-13 DIAGNOSIS — Z973 Presence of spectacles and contact lenses: Secondary | ICD-10-CM

## 2018-09-13 DIAGNOSIS — R45851 Suicidal ideations: Secondary | ICD-10-CM | POA: Diagnosis not present

## 2018-09-13 DIAGNOSIS — M549 Dorsalgia, unspecified: Secondary | ICD-10-CM | POA: Diagnosis present

## 2018-09-13 DIAGNOSIS — F1320 Sedative, hypnotic or anxiolytic dependence, uncomplicated: Secondary | ICD-10-CM | POA: Diagnosis not present

## 2018-09-13 DIAGNOSIS — Z560 Unemployment, unspecified: Secondary | ICD-10-CM

## 2018-09-13 DIAGNOSIS — Z8261 Family history of arthritis: Secondary | ICD-10-CM

## 2018-09-13 DIAGNOSIS — Z8349 Family history of other endocrine, nutritional and metabolic diseases: Secondary | ICD-10-CM

## 2018-09-13 DIAGNOSIS — F1210 Cannabis abuse, uncomplicated: Secondary | ICD-10-CM | POA: Diagnosis not present

## 2018-09-13 DIAGNOSIS — F314 Bipolar disorder, current episode depressed, severe, without psychotic features: Secondary | ICD-10-CM | POA: Diagnosis not present

## 2018-09-13 DIAGNOSIS — K589 Irritable bowel syndrome without diarrhea: Secondary | ICD-10-CM | POA: Diagnosis present

## 2018-09-13 DIAGNOSIS — Z79899 Other long term (current) drug therapy: Secondary | ICD-10-CM

## 2018-09-13 LAB — CBC WITH DIFFERENTIAL/PLATELET
Abs Immature Granulocytes: 0.03 10*3/uL (ref 0.00–0.07)
Basophils Absolute: 0 10*3/uL (ref 0.0–0.1)
Basophils Relative: 0 %
EOS PCT: 1 %
Eosinophils Absolute: 0.1 10*3/uL (ref 0.0–0.5)
HEMATOCRIT: 38.7 % — AB (ref 39.0–52.0)
HEMOGLOBIN: 12.7 g/dL — AB (ref 13.0–17.0)
Immature Granulocytes: 0 %
LYMPHS ABS: 1.9 10*3/uL (ref 0.7–4.0)
Lymphocytes Relative: 19 %
MCH: 30.3 pg (ref 26.0–34.0)
MCHC: 32.8 g/dL (ref 30.0–36.0)
MCV: 92.4 fL (ref 80.0–100.0)
MONO ABS: 1 10*3/uL (ref 0.1–1.0)
MONOS PCT: 10 %
NRBC: 0 % (ref 0.0–0.2)
Neutro Abs: 7 10*3/uL (ref 1.7–7.7)
Neutrophils Relative %: 70 %
Platelets: 173 10*3/uL (ref 150–400)
RBC: 4.19 MIL/uL — ABNORMAL LOW (ref 4.22–5.81)
RDW: 13.3 % (ref 11.5–15.5)
WBC: 9.9 10*3/uL (ref 4.0–10.5)

## 2018-09-13 LAB — RAPID URINE DRUG SCREEN, HOSP PERFORMED
AMPHETAMINES: NOT DETECTED
BENZODIAZEPINES: POSITIVE — AB
Barbiturates: NOT DETECTED
Cocaine: NOT DETECTED
Opiates: NOT DETECTED
Tetrahydrocannabinol: POSITIVE — AB

## 2018-09-13 MED ORDER — CHLORDIAZEPOXIDE HCL 25 MG PO CAPS
25.0000 mg | ORAL_CAPSULE | Freq: Four times a day (QID) | ORAL | Status: AC | PRN
  Administered 2018-09-13: 25 mg via ORAL
  Filled 2018-09-13: qty 1

## 2018-09-13 MED ORDER — THIAMINE HCL 100 MG/ML IJ SOLN
100.0000 mg | Freq: Once | INTRAMUSCULAR | Status: DC
  Filled 2018-09-13: qty 2

## 2018-09-13 MED ORDER — ACETAMINOPHEN 500 MG PO TABS
1000.0000 mg | ORAL_TABLET | Freq: Once | ORAL | Status: AC
  Administered 2018-09-13: 1000 mg via ORAL
  Filled 2018-09-13: qty 2

## 2018-09-13 MED ORDER — ENSURE ENLIVE PO LIQD
237.0000 mL | Freq: Two times a day (BID) | ORAL | Status: DC
  Administered 2018-09-14: 237 mL via ORAL

## 2018-09-13 MED ORDER — VITAMIN B-1 100 MG PO TABS
100.0000 mg | ORAL_TABLET | Freq: Every day | ORAL | Status: DC
  Administered 2018-09-14 – 2018-09-18 (×5): 100 mg via ORAL
  Filled 2018-09-13 (×8): qty 1

## 2018-09-13 MED ORDER — ADULT MULTIVITAMIN W/MINERALS CH
1.0000 | ORAL_TABLET | Freq: Every day | ORAL | Status: DC
  Administered 2018-09-13 – 2018-09-18 (×6): 1 via ORAL
  Filled 2018-09-13 (×10): qty 1

## 2018-09-13 MED ORDER — POTASSIUM CHLORIDE CRYS ER 20 MEQ PO TBCR
40.0000 meq | EXTENDED_RELEASE_TABLET | Freq: Once | ORAL | Status: AC
  Administered 2018-09-13: 40 meq via ORAL
  Filled 2018-09-13: qty 2

## 2018-09-13 MED ORDER — HYDROXYZINE HCL 25 MG PO TABS
25.0000 mg | ORAL_TABLET | Freq: Four times a day (QID) | ORAL | Status: DC | PRN
  Administered 2018-09-14 (×2): 25 mg via ORAL
  Filled 2018-09-13 (×2): qty 1

## 2018-09-13 MED ORDER — OXYCODONE HCL 5 MG PO TABS
5.0000 mg | ORAL_TABLET | Freq: Once | ORAL | Status: AC
  Administered 2018-09-13: 5 mg via ORAL
  Filled 2018-09-13: qty 1

## 2018-09-13 MED ORDER — LOPERAMIDE HCL 2 MG PO CAPS: 2.0000 mg | ORAL_CAPSULE | ORAL | Status: AC | PRN

## 2018-09-13 MED ORDER — SODIUM CHLORIDE 0.9 % IV BOLUS
1000.0000 mL | Freq: Once | INTRAVENOUS | Status: AC
  Administered 2018-09-13: 1000 mL via INTRAVENOUS

## 2018-09-13 MED ORDER — ACETAMINOPHEN 325 MG PO TABS
650.0000 mg | ORAL_TABLET | Freq: Four times a day (QID) | ORAL | Status: DC | PRN
  Administered 2018-09-17: 650 mg via ORAL
  Filled 2018-09-13: qty 2

## 2018-09-13 NOTE — Discharge Instructions (Addendum)
Need to follow up with Dr. Ellene Route with neurosurgery in the outpatient setting for the possible vertebral fracture as dicussed.

## 2018-09-13 NOTE — ED Notes (Signed)
IVC papers has been served and faxed to [facility]; 3 copies on chart in [place], and original placed in red folder-Monique,RN

## 2018-09-13 NOTE — ED Notes (Signed)
Original C-collar removed and Aspen collar applied.  Pt. Tolerated well and encouraged pt. To keep the c-collar on.  Pt. Verbalized understanding.

## 2018-09-13 NOTE — ED Notes (Signed)
[employer] called for Transport to [facility].
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
## 2018-09-13 NOTE — ED Notes (Signed)
Patient is unsteady on his feet; pt appears in hallway asking to call his mother; patient redirected back to room; pt states "Nobody cares about me!"-Monique,RN
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
## 2018-09-13 NOTE — Tx Team (Signed)
Initial Treatment Plan 09/13/2018 6:03 PM Weyman Rodney SLP:[id number]    PATIENT STRESSORS: Legal issue Marital or family conflict   PATIENT STRENGTHS: Ability for insight Active sense of humor   PATIENT IDENTIFIED PROBLEMS: "mom and sister sleeps with population [place]"                     DISCHARGE CRITERIA:  Ability to meet basic life and health needs Adequate post-discharge living arrangements Improved stabilization in mood, thinking, and/or behavior  PRELIMINARY DISCHARGE PLAN: Attend aftercare/continuing care group Attend PHP/IOP  PATIENT/FAMILY INVOLVEMENT: This treatment plan has been presented to and reviewed with the patient, Carl Hughes, and/or family member.  The patient and family have been given the opportunity to ask questions and make suggestions.  Marissa Calamity, RN 09/13/2018, 6:03 PM

## 2018-09-13 NOTE — ED Notes (Signed)
IVC papers faxed to [phone]
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
## 2018-09-13 NOTE — ED Notes (Signed)
Patient refusing to stay connected to the monitor. Ripped off leads multiple times.

## 2018-09-13 NOTE — ED Notes (Signed)
Pt.s lunch tray came, hand sandwich, sprite and cookie.  Pt. Ate 100% of  His tray.

## 2018-09-13 NOTE — ED Notes (Signed)
Pt.use the phone to call his his mom it went striaght  Voice mail,pt is asking a lot of patient.tts is set up to interview him .now pt.is in the bathroom

## 2018-09-13 NOTE — ED Notes (Signed)
Encouraged pt. To put on his C-collar he stated, "No"  Pt.s mother called and I updated her on pt.s plan of care.

## 2018-09-13 NOTE — ED Notes (Signed)
Dr. Billy Fischer at the bedside.  Pt. s mother updated

## 2018-09-13 NOTE — ED Notes (Signed)
Patient expressed that he wanted this nurse to kill him or he would kill himself. Previously denying suicidal thoughts. EDP notified and orders placed for patient to be IVC'd.

## 2018-09-13 NOTE — ED Notes (Signed)
Spoke with pt.s mother, she called very concerned about pt.s Mental health and the ability for him to contract to safety.  She has asked to have our [facility] counselor call her.  Will notify our [facility] counselor.  Pt.'s mothers name is Economist and her phone number is [phone]

## 2018-09-13 NOTE — ED Notes (Signed)
Call from [facility]. Pt has bed at [facility] 300-1.  Attending:Clary MD acceptinGwenlyn Found MD Number to call report [phone]

## 2018-09-13 NOTE — ED Notes (Signed)
[facility] called and verbalized that pt. Will be assessed for in-patient.  They have requested IVC papers to be faxed and to redraw CBC

## 2018-09-13 NOTE — ED Notes (Signed)
Patient refusing to wear C-collar. Patient educated by this nurse and EDP on importance of C-collar. Patient continues to take it off and throw it accross the room.
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
## 2018-09-13 NOTE — ED Notes (Signed)
Patient not able to complete TTS at this time-Monique,RN

## 2018-09-13 NOTE — ED Notes (Signed)
Patient has c-collar on .now.

## 2018-09-13 NOTE — ED Notes (Signed)
Called pt.s mother and updated pt. On transfer to [facility].  All questions answered.

## 2018-09-13 NOTE — ED Notes (Signed)
TTS in progress 

## 2018-09-13 NOTE — ED Provider Notes (Signed)
5:50 AM Patient care assumed from [facility], PA-C at change of shift.  The patient has been medically cleared.  Hemodynamically stable since arrival.  Required IM sedation for agitation.  IVC papers taken out by prior provider.  Pending TTS recommendations.  Disposition to be determined by oncoming ED provider.   Antonietta Breach, PA-C 09/13/18 [id number]    Fatima Blank, MD 09/13/18 [phone]

## 2018-09-13 NOTE — BH Assessment (Signed)
Clinician attempted to complete pt's [facility] Assessment but pt was given meds to calm him down prior to [place] being ordered. Pt was unable to participate in the [facility] Assessment due to being too drowsy; pt could not open his eyes or say anything coherent. TTS will attempt assessment again after the medication wears off.
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
## 2018-09-13 NOTE — ED Notes (Signed)
Pt. Does not like his lunch tray,  He will only eat ham sandwiches.
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
## 2018-09-13 NOTE — Progress Notes (Signed)
Pt accepted to [facility], Bed 300-1  Lindon Romp, NP is the accepting provider.  Myles Lipps, MD is the attending provider.  Call report to [place] ED notified.   Pt is Voluntary.  Pt may be transported by [facility]  Pt scheduled  to arrive at [facility] as soon as repeat CBC is completed.  Areatha Keas. Judi Cong, MSW, [place] Disposition Clinical Social Work [phone] (cell) [phone] (office)

## 2018-09-14 DIAGNOSIS — F419 Anxiety disorder, unspecified: Secondary | ICD-10-CM

## 2018-09-14 DIAGNOSIS — G4700 Insomnia, unspecified: Secondary | ICD-10-CM

## 2018-09-14 DIAGNOSIS — F1320 Sedative, hypnotic or anxiolytic dependence, uncomplicated: Secondary | ICD-10-CM

## 2018-09-14 DIAGNOSIS — F314 Bipolar disorder, current episode depressed, severe, without psychotic features: Secondary | ICD-10-CM

## 2018-09-14 MED ORDER — MIRTAZAPINE 7.5 MG PO TABS
7.5000 mg | ORAL_TABLET | Freq: Every day | ORAL | Status: DC
  Administered 2018-09-14 – 2018-09-17 (×4): 7.5 mg via ORAL
  Filled 2018-09-14 (×7): qty 1

## 2018-09-14 MED ORDER — NICOTINE POLACRILEX 2 MG MT GUM
2.0000 mg | CHEWING_GUM | OROMUCOSAL | Status: DC | PRN
  Administered 2018-09-14 – 2018-09-18 (×18): 2 mg via ORAL
  Filled 2018-09-14: qty 1
  Filled 2018-09-14: qty 4

## 2018-09-14 MED ORDER — QUETIAPINE FUMARATE 100 MG PO TABS
100.0000 mg | ORAL_TABLET | Freq: Every day | ORAL | Status: DC
  Administered 2018-09-14: 100 mg via ORAL
  Filled 2018-09-14 (×3): qty 1

## 2018-09-14 MED ORDER — ENSURE ENLIVE PO LIQD
237.0000 mL | Freq: Three times a day (TID) | ORAL | Status: DC
  Administered 2018-09-14 – 2018-09-18 (×11): 237 mL via ORAL

## 2018-09-14 MED ORDER — DULOXETINE HCL 60 MG PO CPEP
120.0000 mg | ORAL_CAPSULE | Freq: Every day | ORAL | Status: DC
  Administered 2018-09-14 – 2018-09-18 (×5): 120 mg via ORAL
  Filled 2018-09-14 (×9): qty 2

## 2018-09-14 MED ORDER — QUETIAPINE FUMARATE 25 MG PO TABS
25.0000 mg | ORAL_TABLET | Freq: Three times a day (TID) | ORAL | Status: DC | PRN
  Administered 2018-09-14 (×3): 25 mg via ORAL
  Filled 2018-09-14 (×3): qty 1

## 2018-09-14 NOTE — BHH Suicide Risk Assessment (Signed)
[place] INPATIENT:  Family/Significant Other Suicide Prevention Education  Suicide Prevention Education:  Contact Attempts:  Mother: Vadim Centola: [phone], has been identified by the patient as the family member/significant other with whom the patient will be residing, and identified as the person(s) who will aid the patient in the event of a mental health crisis.  With written consent from the patient, two attempts were made to provide suicide prevention education, prior to and/or following the patient's discharge.  We were unsuccessful in providing suicide prevention education.  A suicide education pamphlet was given to the patient to share with family/significant other.  Date and time of first attempt: 09/14/18 3:30   Lawana Pai, MSW Intern 09/14/2018, 3:36 PM

## 2018-09-14 NOTE — BHH Suicide Risk Assessment (Signed)
[facility] Admission Suicide Risk Assessment   Nursing information obtained from:  Patient Demographic factors:  Male, Caucasian Current Mental Status:  Suicidal ideation indicated by others, Self-harm behaviors Loss Factors:  Legal issues Historical Factors:  Family history of mental illness or substance abuse, Impulsivity Risk Reduction Factors:  Sense of responsibility to family, Living with another person, especially a relative  Total Time spent with patient: 1 hour Principal Problem: Bipolar disorder, unspecified ([place]) Diagnosis:   Patient Active Problem List   Diagnosis Date Noted  . Corn of foot [L84] 07/19/2018  . Hyperhidrosis [R61] 07/19/2018  . 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 Metatarsalgia of left foot [M77.42] 07/19/2018  . MDD (major depressive disorder), recurrent severe, without psychosis ([place]) [F33.2] 04/04/2014  . Chronic back pain [M54.9, G89.29] 01/30/2014  . Insomnia [G47.00] 01/30/2014  . Bipolar disorder, unspecified ([place]) [F31.9] 01/30/2014  . Cannabis abuse [F12.10] 09/02/2013  . Benzodiazepine dependence ([place]) [F13.20] 09/02/2013  . Cocaine abuse ([place]) [F14.10] 09/02/2013  . ETOH abuse [F10.10] 09/02/2013  . Opiate abuse, episodic ([place]) [F11.10] 09/02/2013  . Substance induced mood disorder ([place]) [F19.94] 09/02/2013  . Tobacco use disorder [F17.200] 08/18/2011  . IBS (irritable bowel syndrome) [K58.9] 08/18/2011   Subjective Data: see H&P  Continued Clinical Symptoms:  Alcohol Use Disorder Identification Test Final Score (AUDIT): 0 The "Alcohol Use Disorders Identification Test", Guidelines for Use in Primary Care, Second Edition.  World Pharmacologist [facility]). Score between 0-7:  no or low risk or alcohol related problems. Score between 8-15:  moderate risk of alcohol related problems. Score between 16-19:  high risk of alcohol related problems. Score 20 or above:  warrants further diagnostic evaluation for alcohol dependence and  treatment.   Psychiatric Specialty Exam: Physical Exam  Nursing note and vitals reviewed.     Blood pressure 110/73, pulse 74, temperature 98.3 F (36.8 C), temperature source Oral, resp. rate 16, height 6\' 3"  (1.905 m), weight 66.7 kg.Body mass index is 18.37 kg/m.    COGNITIVE FEATURES THAT CONTRIBUTE TO RISK:  None    SUICIDE RISK:   Mild:  Suicidal ideation of limited frequency, intensity, duration, and specificity.  There are no identifiable plans, no associated intent, mild dysphoria and related symptoms, good self-control (both objective and subjective assessment), few other risk factors, and identifiable protective factors, including available and accessible social support.  PLAN OF CARE: see H&P  I certify that inpatient services furnished can reasonably be expected to improve the patient's condition.   Pennelope Bracken, MD 09/14/2018, 11:00 AM
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
## 2018-09-14 NOTE — Plan of Care (Signed)
  Problem: Education: Goal: Verbalization of understanding the information provided will improve Outcome: Progressing Patient is able to verbalize information, education provided.   Problem: Coping: Goal: Ability to verbalize frustrations and anger appropriately will improve Outcome: Progressing Patient irritable however behavior has remained appropriate and redirectable.

## 2018-09-14 NOTE — Plan of Care (Signed)
  Problem: Education: Goal: Emotional status will improve Outcome: Not Progressing Goal: Mental status will improve Outcome: Not Progressing Goal: Verbalization of understanding the information provided will improve Outcome: Not Progressing   Problem: Activity: Goal: Interest or engagement in activities will improve Outcome: Not Progressing Goal: Sleeping patterns will improve Outcome: Not Progressing   Problem: Coping: Goal: Ability to verbalize frustrations and anger appropriately will improve Outcome: Not Progressing Goal: Ability to demonstrate self-control will improve Outcome: Not Progressing

## 2018-09-14 NOTE — Progress Notes (Signed)
Adult Psychoeducational Group Note  Date:  09/14/2018 Time:  9:57 PM  Group Topic/Focus:  Wrap-Up Group:   The focus of this group is to help patients review their daily goal of treatment and discuss progress on daily workbooks.  Participation Level:  Active  Participation Quality:  Appropriate  Affect:  Appropriate  Cognitive:  Alert  Insight: Appropriate  Engagement in Group:  Engaged  Modes of Intervention:  Discussion  Additional Comments:  Pt stated that he is feeling better than he expected to feel. Pt does have complaints of neck pain. Pt stated that his goal is to stay calm and work on issues.   Wynelle Fanny R 09/14/2018, 9:57 PM

## 2018-09-14 NOTE — Progress Notes (Signed)
Patient self inventory- Patient slept poorly last night, sleep medication was not requested. Appetite has been poor, energy level low, concentration poor. Depression, hopelessness, and anxiety rated 5, 6, 7. W/d symptoms include tremors, cravings, agitation, nausea, and irritability. Denies SI HI AVH. Endorses lightheadedness, headaches, pain, and anxiety. Pain rated 7/10 in his neck and back. Patient's goal is "leave" Patient is not compliant with medications. Says it makes him drowsy. Safety is maintained with 15 minute checks as well as environmental checks. Will continue to monitor.
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
## 2018-09-15 MED ORDER — QUETIAPINE FUMARATE 200 MG PO TABS
200.0000 mg | ORAL_TABLET | Freq: Every day | ORAL | Status: DC
  Administered 2018-09-15: 200 mg via ORAL
  Filled 2018-09-15 (×2): qty 1

## 2018-09-15 MED ORDER — HYDROXYZINE HCL 50 MG PO TABS
50.0000 mg | ORAL_TABLET | Freq: Four times a day (QID) | ORAL | Status: DC | PRN
  Administered 2018-09-15 – 2018-09-17 (×3): 50 mg via ORAL
  Filled 2018-09-15 (×3): qty 1

## 2018-09-15 MED ORDER — METHOCARBAMOL 500 MG PO TABS
500.0000 mg | ORAL_TABLET | Freq: Three times a day (TID) | ORAL | Status: DC | PRN
  Administered 2018-09-15 – 2018-09-18 (×7): 500 mg via ORAL
  Filled 2018-09-15 (×7): qty 1

## 2018-09-15 NOTE — Progress Notes (Signed)
Patient did attend the evening speaker AA meeting.  

## 2018-09-15 NOTE — Progress Notes (Signed)
Recreation Therapy Notes  Date: 11.8.19 Time: 0930 Location: [address] Dayroom  Group Topic: Stress Management  Goal Area(s) Addresses:  Patient will verbalize importance of using healthy stress management.  Patient will identify positive emotions associated with healthy stress management.   Behavioral Response: Engaged  Intervention: Stress Management  Activity :  Meditation.  LRT introduced the stress management technique of meditation.  LRT played a meditation that allowed patients to embody the stillness of a mountain.  Patients were to listen and follow along as the meditation played.  Education:  Stress Management, Discharge Planning.   Education Outcome: Acknowledges edcuation/In group clarification offered/Needs additional education  Clinical Observations/Feedback: Pt attended and participated in group.    Victorino Sparrow, LRT/CTRS    Ria Comment, Marks Scalera A 09/15/2018 11:11 AM
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
## 2018-09-15 NOTE — Plan of Care (Addendum)
Patient self inventory- Patient slept poor last night, sleep medication was requested and was not helpful. Depression, hopelessness, and anxiety rated 2, 3, 4. Denies SI HI AVH. Patient's goal is "reduce swelling in head."  Patient is compliant with medications prescribed per provider. No side effects noted. Safety is maintained with 15 minute checks as well as environmental checks.   Problem: Safety: Goal: Periods of time without injury will increase Outcome: Progressing   Problem: Education: Goal: Emotional status will improve Outcome: Not Progressing Goal: Mental status will improve Outcome: Not Progressing   Problem: Activity: Goal: Sleeping patterns will improve Outcome: Not Progressing   Problem: Education: Goal: Ability to state activities that reduce stress will improve Outcome: Not Progressing

## 2018-09-15 NOTE — Tx Team (Signed)
Interdisciplinary Treatment and Diagnostic Plan Update  09/15/2018 Time of Session: [place] MRN: [id number]  Principal Diagnosis: Bipolar disorder, unspecified ([place])  Secondary Diagnoses: Principal Problem:   Bipolar disorder, unspecified ([place]) Active Problems:   Benzodiazepine dependence ([place])   Current Medications:  Current Facility-Administered Medications  Medication Dose Route Frequency Provider Last Rate Last Dose  . acetaminophen (TYLENOL) tablet 650 mg  650 mg Oral Q6H PRN Rankin, Shuvon B, NP      . chlordiazePOXIDE (LIBRIUM) capsule 25 mg  25 mg Oral Q6H PRN Rankin, Shuvon B, NP   25 mg at 09/13/18 2148  . DULoxetine (CYMBALTA) DR capsule 120 mg  120 mg Oral Daily Pennelope Bracken, MD   120 mg at 09/15/18 0749  . feeding supplement (ENSURE ENLIVE) (ENSURE ENLIVE) liquid 237 mL  237 mL Oral TID BM Pennelope Bracken, MD   237 mL at 09/15/18 0831  . hydrOXYzine (ATARAX/VISTARIL) tablet 25 mg  25 mg Oral Q6H PRN Rankin, Shuvon B, NP   25 mg at 09/14/18 2229  . loperamide (IMODIUM) capsule 2-4 mg  2-4 mg Oral PRN Rankin, Shuvon B, NP      . mirtazapine (REMERON) tablet 7.5 mg  7.5 mg Oral QHS Pennelope Bracken, MD   7.5 mg at 09/14/18 2229  . multivitamin with minerals tablet 1 tablet  1 tablet Oral Daily Rankin, Shuvon B, NP   1 tablet at 09/15/18 0749  . nicotine polacrilex (NICORETTE) gum 2 mg  2 mg Oral PRN Pennelope Bracken, MD   2 mg at 09/15/18 0750  . QUEtiapine (SEROQUEL) tablet 100 mg  100 mg Oral QHS Pennelope Bracken, MD   100 mg at 09/14/18 2229  . QUEtiapine (SEROQUEL) tablet 25 mg  25 mg Oral TID PRN Pennelope Bracken, MD   25 mg at 09/14/18 2334  . thiamine (B-1) injection 100 mg  100 mg Intramuscular Once Rankin, Shuvon B, NP      . thiamine (VITAMIN B-1) tablet 100 mg  100 mg Oral Daily Rankin, Shuvon B, NP   100 mg at 09/15/18 0749   PTA Medications: Facility-Administered Medications Prior to Admission   Medication Dose Route Frequency Provider Last Rate Last Dose  . 0.9 %  sodium chloride infusion  500 mL Intravenous Continuous Nandigam, Kavitha V, MD       Medications Prior to Admission  Medication Sig Dispense Refill Last Dose  . ALPRAZolam (XANAX XR) 2 MG 24 hr tablet Take 2 mg by mouth daily.  0 09/12/2018 at Unknown time  . aluminum chloride (DRYSOL) 20 % external solution Apply topically at bedtime. (Patient not taking: Reported on 09/12/2018) 35 mL 0 Not Taking at Unknown time  . cyclobenzaprine (FLEXERIL) 10 MG tablet Take 1 tablet (10 mg total) by mouth 1 day or 1 dose. (Patient not taking: Reported on 09/12/2018) 30 tablet 1 Completed Course at Unknown time  . diazepam (VALIUM) 10 MG tablet Take 10 mg by mouth at bedtime.   09/11/2018 at Unknown time  . DULoxetine (CYMBALTA) 60 MG capsule Take 2 capsules (120 mg total) by mouth every morning. (Patient taking differently: Take 120 mg by mouth daily. ) 60 capsule 0 09/12/2018 at Unknown time  . hydrocortisone 2.5 % cream APPLY TO AFFECTED AREA(S) TWICE DAILY. (Patient not taking: Reported on 05/31/2018) 28 g PRN Not Taking at Unknown time  . pramipexole (MIRAPEX) 0.5 MG tablet Take 0.5 mg by mouth 3 (three) times daily.   09/11/2018 at  Unknown time  . ranitidine (ZANTAC) 150 MG tablet Take 1 tablet (150 mg total) by mouth 2 (two) times daily. (Patient not taking: Reported on 05/31/2018) 60 tablet 11 Not Taking at Unknown time  . tiZANidine (ZANAFLEX) 2 MG tablet TAKE 1 TABLET BY MOUTH ONCE DAILY AT BEDTIME AS NEEDED FOR MUSCLE SPASMS. (Patient not taking: Reported on 09/12/2018) 30 tablet 0 Completed Course at Unknown time    Patient Stressors: Legal issue Marital or family conflict  Patient Strengths: Ability for insight Active sense of humor  Treatment Modalities: Medication Management, Group therapy, Case management,  1 to 1 session with clinician, Psychoeducation, Recreational therapy.   Physician Treatment Plan for Primary Diagnosis:  Bipolar disorder, unspecified ([place]) Long Term Goal(s): Improvement in symptoms so as ready for discharge Improvement in symptoms so as ready for discharge   Short Term Goals: Ability to identify and develop effective coping behaviors will improve Ability to identify triggers associated with substance abuse/mental health issues will improve  Medication Management: Evaluate patient's response, side effects, and tolerance of medication regimen.  Therapeutic Interventions: 1 to 1 sessions, Unit Group sessions and Medication administration.  Evaluation of Outcomes: Progressing  Physician Treatment Plan for Secondary Diagnosis: Principal Problem:   Bipolar disorder, unspecified ([place]) Active Problems:   Benzodiazepine dependence ([place])  Long Term Goal(s): Improvement in symptoms so as ready for discharge Improvement in symptoms so as ready for discharge   Short Term Goals: Ability to identify and develop effective coping behaviors will improve Ability to identify triggers associated with substance abuse/mental health issues will improve     Medication Management: Evaluate patient's response, side effects, and tolerance of medication regimen.  Therapeutic Interventions: 1 to 1 sessions, Unit Group sessions and Medication administration.  Evaluation of Outcomes: Progressing   RN Treatment Plan for Primary Diagnosis: Bipolar disorder, unspecified ([place]) Long Term Goal(s): Knowledge of disease and therapeutic regimen to maintain health will improve  Short Term Goals: Ability to remain free from injury will improve, Ability to disclose and discuss suicidal ideas and Ability to identify and develop effective coping behaviors will improve  Medication Management: RN will administer medications as ordered by provider, will assess and evaluate patient's response and provide education to patient for prescribed medication. RN will report any adverse and/or side effects to prescribing  provider.  Therapeutic Interventions: 1 on 1 counseling sessions, Psychoeducation, Medication administration, Evaluate responses to treatment, Monitor vital signs and CBGs as ordered, Perform/monitor CIWA, COWS, AIMS and Fall Risk screenings as ordered, Perform wound care treatments as ordered.  Evaluation of Outcomes: Progressing   LCSW Treatment Plan for Primary Diagnosis: Bipolar disorder, unspecified ([place]) Long Term Goal(s): Safe transition to appropriate next level of care at discharge, Engage patient in therapeutic group addressing interpersonal concerns.  Short Term Goals: Engage patient in aftercare planning with referrals and resources, Facilitate patient progression through stages of change regarding substance use diagnoses and concerns and Identify triggers associated with mental health/substance abuse issues  Therapeutic Interventions: Assess for all discharge needs, 1 to 1 time with Social worker, Explore available resources and support systems, Assess for adequacy in community support network, Educate family and significant other(s) on suicide prevention, Complete Psychosocial Assessment, Interpersonal group therapy.  Evaluation of Outcomes: Progressing   Progress in Treatment: Attending groups: Yes. Participating in groups: Yes. Taking medication as prescribed: Yes. Toleration medication: Yes. Family/Significant other contact made: Yes, individual(s) contacted:  pt's mother for collateral contact and to complete SPE. Patient understands diagnosis: Yes. Discussing patient identified problems/goals with  staff: Yes. Medical problems stabilized or resolved: Yes. Denies suicidal/homicidal ideation: Yes. Issues/concerns per patient self-inventory: Yes. Other: n/a   New problem(s) identified: No, Describe:  n/a  New Short Term/Long Term Goal(s): detox, medication management for mood stabilization; elimination of SI thoughts; development of comprehensive mental  wellness/sobriety plan.   Patient Goals:  "I guess to get my mood more stable and fell more hopeful."   Discharge Plan or Barriers: Pt plans to return home; follow-up with Dr. Reece Levy at [facility]. Pt continues to decline additional referrals for therapy or PHP/CDIOP. [place] pamphlet, Mobile Crisis information, and AA/NA information provided to patient for additional community support and resources.   Reason for Continuation of Hospitalization: Anxiety Depression Medication stabilization Suicidal ideation Withdrawal symptoms  Estimated Length of Stay: Monday, 09/18/18  Attendees: Patient: 09/15/2018 8:39 AM  Physician: Dr. Nancy Fetter MD 09/15/2018 8:39 AM  Nursing: Yetta Flock RN; Elberta Fortis RN 09/15/2018 8:39 AM  RN Care Manager:x 09/15/2018 8:39 AM  Social Worker: Janice Norrie LCSW 09/15/2018 8:39 AM  Recreational Therapist: x 09/15/2018 8:39 AM  Other: Lindell Spar NP 09/15/2018 8:39 AM  Other:  09/15/2018 8:39 AM  Other: 09/15/2018 8:39 AM    Scribe for Treatment Team: Avelina Laine, LCSW 09/15/2018 8:39 AM

## 2018-09-15 NOTE — [facility] Suicide Risk Assessment (Signed)
[place] INPATIENT:  Family/Significant Other Suicide Prevention Education  Suicide Prevention Education:  Education Completed; Mother: Alexio Sroka: [phone] has been identified by the patient as the family member/significant other with whom the patient will be residing, and identified as the person(s) who will aid the patient in the event of a mental health crisis (suicidal ideations/suicide attempt).  With written consent from the patient, the family member/significant other has been provided the following suicide prevention education, prior to the and/or following the discharge of the patient.  The suicide prevention education provided includes the following:  Suicide risk factors  Suicide prevention and interventions  National Suicide Hotline telephone number  [facility] assessment telephone number  [facility] Emergency Assistance [place] and/or Residential Mobile Crisis Unit telephone number  Request made of family/significant other to:  Remove weapons (e.g., guns, rifles, knives), all items previously/currently identified as safety concern.  Mickel Baas [place] stated that she had a gun that was secured and hidden away.  Remove drugs/medications (over-the-counter, prescriptions, illicit drugs), all items previously/currently identified as a safety concern.  Mickel Baas [place] stated that she has been advocating for her son for many years due to his depression and mental health challenges. Advised about education and support from [email or web address]. The family member/significant other verbalizes understanding of the suicide prevention education information provided.  The family member/significant other agrees to remove the items of safety concern listed above.  Lawana Pai, MSW Intern 09/15/2018, 7:42 AM

## 2018-09-15 NOTE — Progress Notes (Signed)
D: Patient observed up and restless on unit. Continues to have frequent needs however less intrusive than last night. Verbalizes awareness that he is less anxious. Reports robaxin is helping his discomfort. Patient's affect flat, mood anxious. Reports neck pain of a 3/10 and states that it occurs when his brace is off and he turns his head. No other physical complaints.   A: Medicated per orders, prn robaxin and nicorette given per his request. Medication education provided. Level III obs in place for safety. Emotional support offered. Patient encouraged to complete Suicide Safety Plan before discharge. Encouraged to attend and participate in unit programming.  R: Patient verbalizes understanding of POC. Will reassess robaxin in one hour. Reports nicorette is helping. Patient denies SI/HI/AVH and remains safe on level III obs. Will continue to monitor throughout the night.

## 2018-09-16 DIAGNOSIS — F1310 Sedative, hypnotic or anxiolytic abuse, uncomplicated: Secondary | ICD-10-CM

## 2018-09-16 MED ORDER — QUETIAPINE FUMARATE 300 MG PO TABS
300.0000 mg | ORAL_TABLET | Freq: Every day | ORAL | Status: DC
  Administered 2018-09-16 – 2018-09-17 (×2): 300 mg via ORAL
  Filled 2018-09-16 (×4): qty 1

## 2018-09-16 MED ORDER — LORATADINE 10 MG PO TABS
10.0000 mg | ORAL_TABLET | Freq: Every day | ORAL | Status: DC
  Administered 2018-09-16: 10 mg via ORAL
  Filled 2018-09-16 (×4): qty 1

## 2018-09-16 MED ORDER — LORATADINE 10 MG PO TABS
10.0000 mg | ORAL_TABLET | Freq: Every day | ORAL | Status: DC
  Administered 2018-09-16 – 2018-09-17 (×2): 10 mg via ORAL
  Filled 2018-09-16 (×4): qty 1

## 2018-09-16 NOTE — Progress Notes (Signed)
[facility] MD Progress Note  09/16/2018 11:04 AM Carl Hughes  MRN:  [id number]   Subjective: Patient reports today that he did sleep better last night after the increased dose of Seroquel, but feels that he was still waking up several times throughout the night.  He is requesting the Seroquel to be increased again.  He reports his depression is at a 2 out of 10 with 10 being the worst and his anxiety is a 2 out of 10 on the same scale.  He reports that he is improving and feels better.  He denies any suicidal or homicidal ideations and denies any hallucinations.  He denies any medication side effects and denies any withdrawal symptoms today.  Objective: Patient's chart and findings reviewed and discussed with treatment team.  Patient presents in his room lying in his bed and is awake.  Patient is pleasant, calm, and cooperative.  Patient does have a flat affect.  Principal Problem: Bipolar disorder, unspecified ([place]) Diagnosis:   Patient Active Problem List   Diagnosis Date Noted  . Corn of foot [L84] 07/19/2018  . Hyperhidrosis [R61] 07/19/2018  . 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 Metatarsalgia of left foot [M77.42] 07/19/2018  . MDD (major depressive disorder), recurrent severe, without psychosis ([place]) [F33.2] 04/04/2014  . Chronic back pain [M54.9, G89.29] 01/30/2014  . Insomnia [G47.00] 01/30/2014  . Bipolar disorder, unspecified ([place]) [F31.9] 01/30/2014  . Cannabis abuse [F12.10] 09/02/2013  . Benzodiazepine dependence ([place]) [F13.20] 09/02/2013  . Cocaine abuse ([place]) [F14.10] 09/02/2013  . ETOH abuse [F10.10] 09/02/2013  . Opiate abuse, episodic ([place]) [F11.10] 09/02/2013  . Substance induced mood disorder ([place]) [F19.94] 09/02/2013  . Tobacco use disorder [F17.200] 08/18/2011  . IBS (irritable bowel syndrome) [K58.9] 08/18/2011   Total Time spent with patient: 30 minutes  Past Psychiatric History: See H&P  Past Medical History:  Past Medical History:  Diagnosis Date  .  Anal fissure   . Anxiety   . Bipolar disorder ([place])    Dr. Pearson Grippe, [facility]  . Chronic back pain   . Depression   . Drug-seeking behavior   . Exercise-induced asthma   . Family history of adverse reaction to anesthesia   . Fibromyalgia   . Gallbladder polyp   . GERD (gastroesophageal reflux disease)   . IBS (irritable bowel syndrome)   . Polyarthralgia   . Substance abuse ([place])   . Tobacco use disorder   . Wears glasses     Past Surgical History:  Procedure Laterality Date  . WISDOM TOOTH EXTRACTION     Family History:  Family History  Problem Relation Age of Onset  . Depression Father   . Other Father        chronic back pain  . Arthritis Father   . Gout Father   . Heart disease Paternal Grandfather   . Depression Paternal Grandfather   . Colon polyps Mother        benign  . Irritable bowel syndrome Mother   . Heart disease Maternal Grandmother   . Diabetes Maternal Grandfather   . Cancer Maternal Grandfather        colorectal  . Hypertension Maternal Grandfather   . Hyperlipidemia Maternal Grandfather    Family Psychiatric  History: See H&P Social History:  Social History   Substance and Sexual Activity  Alcohol Use No  . Alcohol/week: 3.0 standard drinks  . Types: 3 Cans of beer per week     Social History   Substance  and Sexual Activity  Drug Use Yes  . Types: Marijuana    Social History   Socioeconomic History  . Marital status: Single    Spouse name: Not on file  . Number of children: 0  . Years of education: Not on file  . Highest education level: Not on file  Occupational History  . Occupation: unemployed  Social Needs  . Financial resource strain: Not on file  . Food insecurity:    Worry: Not on file    Inability: Not on file  . Transportation needs:    Medical: Not on file    Non-medical: Not on file  Tobacco Use  . Smoking status: Current Every Day Smoker    Packs/day: 0.50    Years: 7.00    Pack  years: 3.50    Types: Cigarettes  . Smokeless tobacco: Never Used  . Tobacco comment: tobacco info given today  Substance and Sexual Activity  . Alcohol use: No    Alcohol/week: 3.0 standard drinks    Types: 3 Cans of beer per week  . Drug use: Yes    Types: Marijuana  . Sexual activity: Yes    Birth control/protection: None, Condom  Lifestyle  . Physical activity:    Days per week: Not on file    Minutes per session: Not on file  . Stress: Not on file  Relationships  . Social connections:    Talks on phone: Not on file    Gets together: Not on file    Attends religious service: Not on file    Active member of club or organization: Not on file    Attends meetings of clubs or organizations: Not on file    Relationship status: Not on file  Other Topics Concern  . Not on file  Social History Narrative   No relationship currently.   Unemployed.   Hobbies - none.  Enjoys his puppy   Additional Social History:                         Sleep: Fair  Appetite:  Good  Current Medications: Current Facility-Administered Medications  Medication Dose Route Frequency Provider Last Rate Last Dose  . acetaminophen (TYLENOL) tablet 650 mg  650 mg Oral Q6H PRN Rankin, Shuvon B, NP      . chlordiazePOXIDE (LIBRIUM) capsule 25 mg  25 mg Oral Q6H PRN Rankin, Shuvon B, NP   25 mg at 09/13/18 2148  . DULoxetine (CYMBALTA) DR capsule 120 mg  120 mg Oral Daily Pennelope Bracken, MD   120 mg at 09/16/18 0851  . feeding supplement (ENSURE ENLIVE) (ENSURE ENLIVE) liquid 237 mL  237 mL Oral TID BM Pennelope Bracken, MD   237 mL at 09/16/18 0947  . hydrOXYzine (ATARAX/VISTARIL) tablet 50 mg  50 mg Oral Q6H PRN Pennelope Bracken, MD   50 mg at 09/15/18 2303  . loperamide (IMODIUM) capsule 2-4 mg  2-4 mg Oral PRN Rankin, Shuvon B, NP      . loratadine (CLARITIN) tablet 10 mg  10 mg Oral Daily Tyeler Goedken, Lowry Ram, FNP   10 mg at 09/16/18 0851  . methocarbamol (ROBAXIN) tablet  500 mg  500 mg Oral Q8H PRN Pennelope Bracken, MD   500 mg at 09/16/18 0851  . mirtazapine (REMERON) tablet 7.5 mg  7.5 mg Oral QHS Pennelope Bracken, MD   7.5 mg at 09/15/18 2302  . multivitamin with minerals tablet 1 tablet  1 tablet Oral Daily Rankin, Shuvon B, NP   1 tablet at 09/16/18 0851  . nicotine polacrilex (NICORETTE) gum 2 mg  2 mg Oral PRN Pennelope Bracken, MD   2 mg at 09/16/18 0853  . QUEtiapine (SEROQUEL) tablet 25 mg  25 mg Oral TID PRN Pennelope Bracken, MD   25 mg at 09/14/18 2334  . QUEtiapine (SEROQUEL) tablet 300 mg  300 mg Oral QHS Giovana Faciane B, FNP      . thiamine (VITAMIN B-1) tablet 100 mg  100 mg Oral Daily Rankin, Shuvon B, NP   100 mg at 09/16/18 0851    Lab Results: No results found for this or any previous visit (from the past 48 hour(s)).  Blood Alcohol level:  Lab Results  Component Value Date   ETH 15 (H) 09/12/2018   ETH <11 69/48/5462    Metabolic Disorder Labs: Lab Results  Component Value Date   HGBA1C 5.4 01/30/2014   MPG 108 01/30/2014   No results found for: PROLACTIN Lab Results  Component Value Date   CHOL 160 01/30/2014   TRIG 112 01/30/2014   HDL 36 (L) 01/30/2014   CHOLHDL 4.4 01/30/2014   VLDL 22 01/30/2014   LDLCALC 102 (H) 01/30/2014    Physical Findings: AIMS: Facial and Oral Movements Muscles of Facial Expression: None, normal Lips and Perioral Area: None, normal Jaw: None, normal Tongue: None, normal,Extremity Movements Upper (arms, wrists, hands, fingers): None, normal Lower (legs, knees, ankles, toes): None, normal, Trunk Movements Neck, shoulders, hips: None, normal, Overall Severity Severity of abnormal movements (highest score from questions above): None, normal Incapacitation due to abnormal movements: None, normal Patient's awareness of abnormal movements (rate only patient's report): No Awareness, Dental Status Current problems with teeth and/or dentures?: No Does patient  usually wear dentures?: No  CIWA:  CIWA-Ar Total: 0 COWS:  COWS Total Score: 1  Musculoskeletal: Strength & Muscle Tone: within normal limits Gait & Station: normal Patient leans: N/A  Psychiatric Specialty Exam: Physical Exam  Nursing note and vitals reviewed. Constitutional: He is oriented to person, place, and time. He appears well-developed and well-nourished.  Cardiovascular: Normal rate.  Respiratory: Effort normal.  Musculoskeletal: Normal range of motion.  Neurological: He is alert and oriented to person, place, and time.  Skin: Skin is warm.    Review of Systems  Constitutional: Negative.   HENT: Negative.   Eyes: Negative.   Respiratory: Negative.   Cardiovascular: Negative.   Gastrointestinal: Negative.   Genitourinary: Negative.   Musculoskeletal: Negative.   Skin: Negative.   Neurological: Negative.   Endo/Heme/Allergies: Negative.   Psychiatric/Behavioral: Positive for depression and substance abuse. Negative for suicidal ideas. The patient is nervous/anxious.     Blood pressure (!) 127/92, pulse 97, temperature 97.7 F (36.5 C), temperature source Oral, resp. rate 16, height 6\' 3"  (1.905 m), weight 66.7 kg.Body mass index is 18.37 kg/m.  General Appearance: Casual  Eye Contact:  Good  Speech:  Clear and Coherent and Normal Rate  Volume:  Decreased  Mood:  Depressed  Affect:  Flat  Thought Process:  Goal Directed and Descriptions of Associations: Intact  Orientation:  Full (Time, Place, and Person)  Thought Content:  WDL  Suicidal Thoughts:  No  Homicidal Thoughts:  No  Memory:  Immediate;   Good Recent;   Good Remote;   Good  Judgement:  Fair  Insight:  Fair  Psychomotor Activity:  Normal  Concentration:  Concentration: Good and Attention Span: Good  Recall:  Good  Fund of Knowledge:  Good  Language:  Good  Akathisia:  No  Handed:  Right  AIMS (if indicated):     Assets:  Communication Skills Desire for Improvement Physical  Health Transportation  ADL's:  Intact  Cognition:  WNL  Sleep:  Number of Hours: 4.25   Problems addressed Bipolar disorder unspecified Benzodiazepine dependence  Treatment Plan Summary: Daily contact with patient to assess and evaluate symptoms and progress in treatment, Medication management and Plan is to: Continue Librium as needed detox for CIWA greater than 10 Continue Cymbalta 120 mg p.o. daily for mood stability Continue Vistaril 50 mg p.o. every 6 hours as needed for anxiety Start Claritin 10 mg daily for allergies Continue Remeron 7.5 mg p.o. nightly for mood stability and insomnia Continue Seroquel 25 mg p.o. 3 times daily as needed for severe anxiety Increase Seroquel 300 mg p.o. nightly for mood stability Encourage group therapy participation  Lewis Shock, FNP 09/16/2018, 11:04 AM
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
## 2018-09-16 NOTE — [facility] Group Notes (Signed)
[facility] Group Notes:  (Nursing)  Date:  09/16/2018  Time: 115 PM Type of Therapy:  Nurse Education  Participation Level:  Active  Participation Quality:  Appropriate  Affect:  Appropriate  Cognitive:  Alert and Appropriate  Insight:  Appropriate  Engagement in Group:  Engaged  Modes of Intervention:  Discussion and Education  Summary of Progress/Problems: Nurse led group played a non competitive learning/communication board game that fosters listening skills as well as expression Waymond Cera 09/16/2018, 3:20 PM

## 2018-09-16 NOTE — Progress Notes (Signed)
Adult Psychoeducational Group Note  Date:  09/16/2018 Time:  9:07 PM  Group Topic/Focus:  Wrap-Up Group:   The focus of this group is to help patients review their daily goal of treatment and discuss progress on daily workbooks.  Participation Level:  Active  Participation Quality:  Appropriate  Affect:  Appropriate  Cognitive:  Appropriate  Insight: Appropriate  Engagement in Group:  Engaged  Modes of Intervention:  Discussion  Additional Comments:  The patient expressed that he rates today a 7.The patient also said his goal is to stay focused.  Carl Hughes 09/16/2018, 9:07 PM

## 2018-09-16 NOTE — Progress Notes (Addendum)
D: Pt was in hallway with visitors upon initial approach.  Pt presents with anxious, depressed affect and mood.  He describes his day as "pretty uneventful."  His goal tonight is to "sleep well and tomorrow to stay focused so I can leave Monday."  He reports he had a good visit with his friend tonight.  Pt denies SI/HI, denies hallucinations, denies pain.  He requests to take his Claritin at bedtime since this is how he takes it at home.  Pt has worn cervical collar tonight and was encouraged to continue to do so.  Pt has been visible in milieu interacting with peers and staff appropriately.  Pt attended evening group.    A: Introduced self to pt.  Met with pt 1:1.  Actively listened to pt and offered support and encouragement.  Medications administered per order.  PRN medication administered for muscle spasms and anxiety.  Q15 minute safety checks maintained.  R: Pt is safe on the unit.  Pt is compliant with medications.  Pt verbally contracts for safety.  Will continue to monitor and assess.

## 2018-09-16 NOTE — Progress Notes (Signed)
D: Patient observed at medication window to receive morning medications, not wearing his c-collar.  He states "I can't wear it when I take meds" or "when I sleep."  A: Reviewed the risks of not wearing, including paralysis. Reinforced importance of wearing at all times.  R: Patient observed wearing during group after medication pass.
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
## 2018-09-17 NOTE — Plan of Care (Signed)
  Problem: Safety: Goal: Ability to remain free from injury will improve Outcome: Progressing Note: Pt has not harmed self or others tonight.  He denies SI/HI and verbally contracts for safety.    

## 2018-09-17 NOTE — Progress Notes (Signed)
D: Patient observed up visiting with mom at start of shift. Patient states he is discharging tomorrow and feels prepared. Patient's affect flat, mood slightly anxious. Patient pleasant and cooperative. Denies pain, physical complaints at present.   A: Medicated per orders, no prns given for thus far. Medication education provided. Level III obs in place for safety. Emotional support offered. Patient encouraged to complete Suicide Safety Plan before discharge. Encouraged to attend and participate in unit programming. Patient and mother asking about discharge tomorrow and information was provided.  R: Patient verbalizes understanding of POC, discharge process. Patient denies SI/HI/AVH and remains safe on level III obs. Will continue to monitor throughout the night.

## 2018-09-17 NOTE — [facility] Group Notes (Signed)
[facility] Group Notes:  (Nursing)  Date:  09/17/2018  Time:  1:30 PM Type of Therapy:  Nurse Education  Participation Level:  Active  Participation Quality:  Appropriate and Attentive  Affect:  Appropriate  Cognitive:  Alert and Appropriate  Insight:  Appropriate and Good  Engagement in Group:  Engaged  Modes of Intervention:  Discussion and Education  Summary of Progress/Problems: Nurse led psycho-educational group- Sleep Hygiene Waymond Cera 09/17/2018, 2:24 PM

## 2018-09-17 NOTE — [facility] Group Notes (Signed)
[place] LCSW Group Therapy Note  09/17/2018  19:00-10:00AM  Type of Therapy and Topic:  Group Therapy:  Adding Supports Including Being Your Own Support  Participation Level:  Did Not Attend   Description of Group:  Patients in this group were introduced to the concept that additional supports including self-support are an essential part of recovery.  A song entitled "I Need Help!" was played and a group discussion was held in reaction to the idea of needing to add supports.  A song entitled "My Own Hero" was played and a group discussion ensued in which patients stated they could relate to the song and it inspired them to realize they have be willing to help themselves in order to succeed, because other people cannot achieve sobriety or stability for them.  We discussed adding a variety of healthy supports to address the various needs in their lives.  A song was played called "I Know Where I've Been" toward the end of group and used to conduct an inspirational wrap-up to group of remembering how far they have already come in their journey.  Therapeutic Goals: 1)  demonstrate the importance of being a part of one's own support system 2)  discuss reasons people in one's life may eventually be unable to be continually supportive  3)  identify the patient's current support system and   4)  elicit commitments to add healthy supports and to become more conscious of being self-supportive   Summary of Patient Progress:  The patient came into the room about 30 minutes after it started, but immediately left and did not return.   Therapeutic Modalities:   Motivational Interviewing Activity  Maretta Los

## 2018-09-17 NOTE — Plan of Care (Signed)
D: Patient presents euthymic, flat. He continues to be non-compliant with wearing c-collar. He reports neck pain 3/10, and requested Robaxin. He slept poorly last night and requested medication but felt it was not helpful. His appetite is good, energy low and concentration poor. He rates his depression and feeling of hopelessness 1/10, and anxiety 2/10. He denies withdrawal symptoms. Patient denies SI/HI/AVH.  A: RN educated on risks of not wearing c-collar. Administered Robaxin for muscle stiffness/spasm. Patient checked q15 min, and checks reviewed. Reviewed medication changes with patient and educated on side effects. Educated patient on importance of attending group therapy sessions and educated on several coping skills. Encouarged participation in milieu through recreation therapy and attending meals with peers. Support and encouragement provided. Fluids offered. R: Patient receptive to education on medications, and is medication compliant. Patient contracts for safety on the unit. Goal: "staying focused."

## 2018-09-17 NOTE — Progress Notes (Signed)
Patient did attend the evening speaker AA meeting.  

## 2018-09-18 MED ORDER — QUETIAPINE FUMARATE 300 MG PO TABS: 300.0000 mg | ORAL_TABLET | Freq: Every day | ORAL | 0 refills | Status: DC

## 2018-09-18 MED ORDER — HYDROXYZINE HCL 50 MG PO TABS: 50.0000 mg | ORAL_TABLET | Freq: Four times a day (QID) | ORAL | 0 refills | Status: DC | PRN

## 2018-09-18 MED ORDER — MIRTAZAPINE 7.5 MG PO TABS: 7.5000 mg | ORAL_TABLET | Freq: Every day | ORAL | 0 refills | Status: DC

## 2018-09-18 MED ORDER — DULOXETINE HCL 60 MG PO CPEP: 120.0000 mg | ORAL_CAPSULE | Freq: Every day | ORAL | 1 refills | Status: DC

## 2018-09-18 MED FILL — MIRTAZAPINE 7.5 MG TABLET: 7.5 | 30 days supply | Qty: 30 | Fill #0

## 2018-09-18 MED FILL — HYDROXYZINE PAM 50 MG CAP: 50 | 8 days supply | Qty: 30 | Fill #0

## 2018-09-18 MED FILL — QUETIAPINE FUMARATE 300 MG: 300 | 30 days supply | Qty: 30 | Fill #0

## 2018-09-18 NOTE — Progress Notes (Signed)
Discharge note: Patient reviewed discharge paperwork with RN including prescriptions, follow up appointments, and lab work. Patient given the opportunity to ask questions. All concerns were addressed. All belongings were returned to patient. Denied SI/HI/AVH. Patient thanked staff for their care while at the hospital.  Patient was discharged to the lobby where his mother was there to pick him up.

## 2018-09-18 NOTE — Progress Notes (Signed)
Recreation Therapy Notes  Date: 11.11.19 Time: 0930 Location: [address] Dayroom  Group Topic: Stress Management  Goal Area(s) Addresses:  Patient will verbalize importance of using healthy stress management.  Patient will identify positive emotions associated with healthy stress management.   Behavioral Response: Engaged  Intervention: Stress Management  Activity :  Progressive Muscle Relaxation.  LRT introduced the stress management technique of progressive muscle relaxation.  LRT read a script to lead patients through a series of exercises that allowed them to tense and then relax each muscle group individually.    Education:  Stress Management, Discharge Planning.   Education Outcome: Acknowledges edcuation/In group clarification offered/Needs additional education  Clinical Observations/Feedback: Pt attended and participated in group.    Victorino Sparrow, LRT/CTRS    Victorino Sparrow A 09/18/2018 10:49 AM
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
## 2018-09-18 NOTE — Plan of Care (Signed)
  Problem: Education: Goal: Knowledge of [place] [facility] Education information/materials will improve Outcome: Adequate for Discharge   Problem: Education: Goal: Emotional status will improve Outcome: Adequate for Discharge   Problem: Education: Goal: Verbalization of understanding the information provided will improve Outcome: Adequate for Discharge   Problem: Activity: Goal: Interest or engagement in activities will improve Outcome: Adequate for Discharge   Patient said he is ready for discharge.

## 2018-09-18 NOTE — BHH Group Notes (Signed)
Adult Psychoeducational Group Note  Date:  09/18/2018 Time:  9:14 AM  Group Topic/Focus:  Orientation:   The focus of this group is to educate the patient on the purpose and policies of crisis stabilization and provide a format to answer questions about their admission.  The group details unit policies and expectations of patients while admitted.  Participation Level:  Active  Participation Quality:  Appropriate  Affect:  Appropriate  Cognitive:  Appropriate  Insight: Appropriate  Engagement in Group:  Engaged  Modes of Intervention:  Orientation  Additional Comments:  Pt attended orientation group facilitated by Wynne Dust 09/18/2018, 9:14 AM

## 2018-09-18 NOTE — Progress Notes (Signed)
  [facility] Adult Case Management Discharge Plan :  Will you be returning to the same living situation after discharge:  Yes,  home At discharge, do you have transportation home?: Yes,  family member Do you have the ability to pay for your medications: Yes,  [facility]  Release of information consent forms completed and submitted to medical records by CSW.   Patient to Follow up at: Follow-up [place], [facility]. Go on 10/12/2018.   Specialty:  Behavioral Health Why:  Appointment with Dr. Reece Levy is Thursday December 5 at 1:30 p.m.  Dr. Reece Levy will contact you if an earlier appointment date becomes available.  Contact information: [address] [place] [address] [phone]        Patient declined therapy referral Follow up.           Next level of care provider has access to [place] and Suicide Prevention discussed: Yes,  SPE completed with pt's mother. SPI pamphlet and mobile crisis information provided to pt.   Have you used any form of tobacco in the last 30 days? (Cigarettes, Smokeless Tobacco, Cigars, and/or Pipes): Yes  Has patient been referred to the Quitline?: Patient refused referral  Patient has been referred for addiction treatment: Yes  Avelina Laine, LCSW 09/18/2018, 8:34 AM

## 2018-09-18 NOTE — BHH Suicide Risk Assessment (Signed)
[facility] Discharge Suicide Risk Assessment   Principal Problem: Bipolar disorder, unspecified [facility]) Discharge Diagnoses:  Patient Active Problem List   Diagnosis Date Noted  . Corn of foot [L84] 07/19/2018  . Hyperhidrosis [R61] 07/19/2018  . 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 Metatarsalgia of left foot [M77.42] 07/19/2018  . MDD (major depressive disorder), recurrent severe, without psychosis ([place]) [F33.2] 04/04/2014  . Chronic back pain [M54.9, G89.29] 01/30/2014  . Insomnia [G47.00] 01/30/2014  . Bipolar disorder, unspecified ([place]) [F31.9] 01/30/2014  . Cannabis abuse [F12.10] 09/02/2013  . Benzodiazepine dependence ([place]) [F13.20] 09/02/2013  . Cocaine abuse ([place]) [F14.10] 09/02/2013  . ETOH abuse [F10.10] 09/02/2013  . Opiate abuse, episodic ([place]) [F11.10] 09/02/2013  . Substance induced mood disorder ([place]) [F19.94] 09/02/2013  . Tobacco use disorder [F17.200] 08/18/2011  . IBS (irritable bowel syndrome) [K58.9] 08/18/2011   Total Time spent with patient: 30 minutes  Psychiatric Specialty Exam: Review of Systems  Constitutional: Negative for chills and fever.  Respiratory: Negative for cough and shortness of breath.   Cardiovascular: Negative for chest pain.  Gastrointestinal: Negative for abdominal pain, heartburn, nausea and vomiting.  Psychiatric/Behavioral: Negative for depression, hallucinations and suicidal ideas. The patient is not nervous/anxious and does not have insomnia.     Blood pressure 117/86, pulse (!) 120, temperature (!) 97.5 F (36.4 C), temperature source Oral, resp. rate 16, height 6\' 3"  (1.905 m), weight 66.7 kg.Body mass index is 18.37 kg/m.   Mental Status Per Nursing Assessment::   On Admission:  Suicidal ideation indicated by others, Self-harm behaviors  Demographic Factors:  Male, Adolescent or young adult, Caucasian, Low socioeconomic status and Unemployed  Loss Factors: Financial problems/change in socioeconomic status  Historical  Factors: Impulsivity  Risk Reduction Factors:   Positive social support, Positive therapeutic relationship and Positive coping skills or problem solving skills  Continued Clinical Symptoms:  Bipolar Disorder:   Depressive phase Alcohol/Substance Abuse/Dependencies  Cognitive Features That Contribute To Risk:  None    Suicide Risk:  Minimal: No identifiable suicidal ideation.  Patients presenting with no risk factors but with morbid ruminations; may be classified as minimal risk based on the severity of the depressive symptoms  [place]. Go on 10/12/2018.   Specialty:  Behavioral Health Why:  Appointment with Dr. Reece Levy is Thursday December 5 at 1:30 p.m.  Dr. Reece Levy will contact you if an earlier appointment date becomes available.  Contact information: [address]  [place] [address] [phone]        Patient declined therapy referral Follow up.           Plan Of Care/Follow-up recommendations:  Activity:  as tolerated Diet:  normal Tests:  NA Other:  see above for [place], MD 09/18/2018, 8:30 AM

## 2018-09-21 MED FILL — METHOCARBAMOL 500 MG TABS: 500 | 10 days supply | Qty: 30 | Fill #0

## 2018-09-26 DIAGNOSIS — M542 Cervicalgia: Secondary | ICD-10-CM | POA: Diagnosis not present

## 2018-09-27 MED FILL — QUETIAPINE FUMARATE 200 MG: 200 | 30 days supply | Qty: 30 | Fill #0

## 2018-09-29 MED FILL — METHYLPREDNISOLONE 4 MG TAB: 4 | 6 days supply | Qty: 21 | Fill #0

## 2018-10-03 DIAGNOSIS — G44319 Acute post-traumatic headache, not intractable: Secondary | ICD-10-CM | POA: Diagnosis not present

## 2018-10-03 DIAGNOSIS — M545 Low back pain: Secondary | ICD-10-CM | POA: Diagnosis not present

## 2018-10-03 DIAGNOSIS — M797 Fibromyalgia: Secondary | ICD-10-CM | POA: Diagnosis not present

## 2018-10-03 DIAGNOSIS — M542 Cervicalgia: Secondary | ICD-10-CM | POA: Diagnosis not present

## 2018-10-03 DIAGNOSIS — Z79891 Long term (current) use of opiate analgesic: Secondary | ICD-10-CM | POA: Diagnosis not present

## 2018-10-06 MED FILL — diazePAM 10 MG TABS: 10 | 30 days supply | Qty: 30 | Fill #0

## 2018-10-06 MED FILL — ALPRAZolam ER 2 MG TB24: 2 | 30 days supply | Qty: 30 | Fill #1

## 2018-10-09 MED FILL — PROMETHAZINE 6.25 MG/5 ML S: 6.25 | 3 days supply | Qty: 100 | Fill #0

## 2018-10-10 DIAGNOSIS — F1129 Opioid dependence with unspecified opioid-induced disorder: Secondary | ICD-10-CM | POA: Diagnosis not present

## 2018-10-10 DIAGNOSIS — M5416 Radiculopathy, lumbar region: Secondary | ICD-10-CM | POA: Diagnosis not present

## 2018-10-10 DIAGNOSIS — G44319 Acute post-traumatic headache, not intractable: Secondary | ICD-10-CM | POA: Diagnosis not present

## 2018-10-10 DIAGNOSIS — M461 Sacroiliitis, not elsewhere classified: Secondary | ICD-10-CM | POA: Diagnosis not present

## 2018-10-10 DIAGNOSIS — M542 Cervicalgia: Secondary | ICD-10-CM | POA: Diagnosis not present

## 2018-10-10 DIAGNOSIS — M545 Low back pain: Secondary | ICD-10-CM | POA: Diagnosis not present

## 2018-10-12 MED FILL — PREGABALIN 75 MG CAPS: 75 | 34 days supply | Qty: 90 | Fill #0

## 2018-10-16 MED FILL — DULoxetine HCL 60 MG CPEP: 60 | 30 days supply | Qty: 60 | Fill #2

## 2018-10-18 MED FILL — MIRTAZAPINE 7.5 MG TABLET: 7.5 | 30 days supply | Qty: 30 | Fill #0

## 2018-10-30 ENCOUNTER — Telehealth: Payer: Self-pay

## 2018-10-30 MED FILL — DRYSOL DAB-O-MATIC SOLUTION: 20 | 20 days supply | Qty: 35 | Fill #0

## 2018-10-30 MED FILL — PRAMIPEXOLE 0.5 MG TABLET: 0.5 | 30 days supply | Qty: 30 | Fill #0

## 2018-10-30 MED FILL — QUETIAPINE FUMARATE 200 MG: 200 | 30 days supply | Qty: 30 | Fill #0

## 2018-10-30 NOTE — Telephone Encounter (Signed)
Pharmacy sent fax stating that patient is requesting a refill on Lidocaine patches. Please advise.

## 2018-11-02 ENCOUNTER — Telehealth: Payer: Self-pay

## 2018-11-02 MED ORDER — LIDOCAINE 5 % EX PTCH: 1.0000 | MEDICATED_PATCH | CUTANEOUS | 0 refills | Status: DC

## 2018-11-02 MED FILL — LIDOCAINE PATCH 5%: 5 | 90 days supply | Qty: 90 | Fill #0

## 2018-11-02 NOTE — Telephone Encounter (Signed)
meds was sent as a fax

## 2018-11-02 NOTE — Telephone Encounter (Signed)
Done

## 2018-11-03 MED FILL — DIAZEPAM 10 MG TABS: 10 | 30 days supply | Qty: 30 | Fill #1

## 2018-11-03 MED FILL — ALPRAZolam ER 2 MG TB24: 2 | 30 days supply | Qty: 30 | Fill #2

## 2018-11-09 MED FILL — METHOCARBAMOL 500 MG TABS: 500 | 30 days supply | Qty: 90 | Fill #0

## 2018-11-10 MED FILL — DULoxetine HCL 60 MG CPEP: 60 | 30 days supply | Qty: 60 | Fill #0

## 2018-11-14 DIAGNOSIS — Z79891 Long term (current) use of opiate analgesic: Secondary | ICD-10-CM | POA: Diagnosis not present

## 2018-11-14 DIAGNOSIS — F3181 Bipolar II disorder: Secondary | ICD-10-CM | POA: Diagnosis not present

## 2018-11-15 MED FILL — PRAMIPEXOLE 0.75 MG TABLET: 0.75 | 30 days supply | Qty: 30 | Fill #0

## 2018-12-04 MED FILL — ALPRAZolam ER 2 MG TB24: 2 | 30 days supply | Qty: 30 | Fill #0

## 2018-12-04 MED FILL — PRAMIPEXOLE 0.5 MG TABLET: 0.5 | 30 days supply | Qty: 30 | Fill #1

## 2018-12-04 MED FILL — DULoxetine HCL 60 MG CPEP: 60 | 30 days supply | Qty: 60 | Fill #1

## 2018-12-04 MED FILL — DIAZEPAM 10 MG TABS: 10 | 30 days supply | Qty: 30 | Fill #2

## 2018-12-04 MED FILL — QUETIAPINE FUMARATE 200 MG: 200 | 30 days supply | Qty: 30 | Fill #0

## 2019-01-01 MED FILL — PRAMIPEXOLE 0.5 MG TABLET: 0.5 | 30 days supply | Qty: 30 | Fill #2

## 2019-01-01 MED FILL — ALPRAZolam ER 2 MG TB24: 2 | 30 days supply | Qty: 30 | Fill #1 | Status: TO

## 2019-01-01 MED FILL — QUETIAPINE FUMARATE 200 MG: 200 | 30 days supply | Qty: 30 | Fill #1

## 2019-01-01 MED FILL — DIAZEPAM 10 MG TABS: 10 | 30 days supply | Qty: 30 | Fill #0 | Status: TO

## 2019-01-15 MED FILL — DULoxetine HCL 60 MG CPEP: 60 | 30 days supply | Qty: 60 | Fill #2

## 2019-01-17 MED FILL — QUETIAPINE FUMARATE 300 MG: 300 | 90 days supply | Qty: 90 | Fill #0

## 2019-01-29 MED FILL — ALPRAZolam ER 2 MG TB24: 2 | 30 days supply | Qty: 30 | Fill #0

## 2019-01-29 MED FILL — DIAZEPAM 10 MG TABS: 10 | 30 days supply | Qty: 30 | Fill #0

## 2019-01-30 MED FILL — PRAMIPEXOLE 0.5 MG TABLET: 0.5 | 30 days supply | Qty: 30 | Fill #0

## 2019-02-12 MED FILL — DULOXETINE HCL 60 MG CPEP: 60 | 30 days supply | Qty: 60 | Fill #0

## 2019-02-26 MED FILL — DIAZEPAM 10 MG TABS: 10 | 30 days supply | Qty: 30 | Fill #1

## 2019-02-28 DIAGNOSIS — F3181 Bipolar II disorder: Secondary | ICD-10-CM | POA: Diagnosis not present

## 2019-02-28 MED FILL — ALPRAZolam ER 2 MG TB24: 2 | 30 days supply | Qty: 30 | Fill #0

## 2019-02-28 MED FILL — METHOCARBAMOL 500 MG TABLET: 500 | 90 days supply | Qty: 270 | Fill #0

## 2019-02-28 MED FILL — PRAMIPEXOLE 0.75 MG TABLET: 0.75 | 30 days supply | Qty: 30 | Fill #0

## 2019-02-28 MED FILL — QUETIAPINE FUMARATE 50 MG T: 50 | 30 days supply | Qty: 30 | Fill #0

## 2019-03-06 MED FILL — PRAMIPEXOLE 0.5 MG TABLET: 0.5 | 90 days supply | Qty: 90 | Fill #0

## 2019-03-12 MED FILL — DULOXETINE HCL 60 MG CPEP: 60 | 30 days supply | Qty: 60 | Fill #1

## 2019-03-26 MED FILL — ALPRAZolam ER 2 MG TB24: 2 | 30 days supply | Qty: 30 | Fill #1

## 2019-03-26 MED FILL — DIAZEPAM 10 MG TABS: 10 | 30 days supply | Qty: 30 | Fill #0

## 2019-03-29 MED FILL — QUETIAPINE FUMARATE 50 MG T: 50 | 30 days supply | Qty: 30 | Fill #1

## 2019-04-09 MED FILL — QUETIAPINE 300 MG TABLET: 300 | 90 days supply | Qty: 90 | Fill #0

## 2019-04-13 MED FILL — PRAMIPEXOLE 0.75 MG TABLET: 0.75 | 30 days supply | Qty: 30 | Fill #1

## 2019-04-16 MED FILL — DULOXETINE HCL 60 MG CPEP: 60 | 30 days supply | Qty: 60 | Fill #2

## 2019-04-19 ENCOUNTER — Ambulatory Visit (INDEPENDENT_AMBULATORY_CARE_PROVIDER_SITE_OTHER): Payer: 59 | Admitting: Medical

## 2019-04-19 ENCOUNTER — Telehealth: Payer: Self-pay | Admitting: Family Medicine

## 2019-04-19 ENCOUNTER — Other Ambulatory Visit: Payer: Self-pay

## 2019-04-19 ENCOUNTER — Ambulatory Visit
Admission: RE | Admit: 2019-04-19 | Discharge: 2019-04-19 | Disposition: A | Discharge status: Home / Self Care | Payer: 59 | Source: Ambulatory Visit | Attending: Medical | Admitting: Medical

## 2019-04-19 VITALS — BP 113/79 | HR 81 | Wt 147.0 lb

## 2019-04-19 DIAGNOSIS — M549 Dorsalgia, unspecified: Secondary | ICD-10-CM

## 2019-04-19 DIAGNOSIS — K5900 Constipation, unspecified: Secondary | ICD-10-CM | POA: Diagnosis not present

## 2019-04-19 DIAGNOSIS — R110 Nausea: Secondary | ICD-10-CM

## 2019-04-19 DIAGNOSIS — R109 Unspecified abdominal pain: Secondary | ICD-10-CM

## 2019-04-19 DIAGNOSIS — N23 Unspecified renal colic: Secondary | ICD-10-CM | POA: Diagnosis not present

## 2019-04-19 DIAGNOSIS — N200 Calculus of kidney: Secondary | ICD-10-CM | POA: Diagnosis not present

## 2019-04-19 LAB — POCT URINALYSIS DIP (PROADVANTAGE DEVICE)
Bilirubin, UA: NEGATIVE
Glucose, UA: NEGATIVE mg/dL
Nitrite, UA: NEGATIVE
Protein Ur, POC: NEGATIVE mg/dL
Specific Gravity, Urine: 1.02
Urobilinogen, Ur: 3.5
pH, UA: 6 (ref 5.0–8.0)

## 2019-04-19 LAB — POCT UA - MICROSCOPIC ONLY

## 2019-04-19 MED ORDER — OXYCODONE-ACETAMINOPHEN 5-325 MG PO TABS: 1.0000 | ORAL_TABLET | Freq: Four times a day (QID) | ORAL | 0 refills | Status: DC | PRN

## 2019-04-19 MED ORDER — PROMETHAZINE HCL 25 MG PO TABS: 25.0000 mg | ORAL_TABLET | Freq: Three times a day (TID) | ORAL | 0 refills | Status: DC | PRN

## 2019-04-19 MED ORDER — TAMSULOSIN HCL 0.4 MG PO CAPS: 0.4000 mg | ORAL_CAPSULE | Freq: Every day | ORAL | 0 refills | Status: DC

## 2019-04-19 MED FILL — OXYCODONE-ACETAMINOPHEN 5-3: 5-325 | 3 days supply | Qty: 12 | Fill #0

## 2019-04-19 MED FILL — TAMSULOSIN HCL 0.4 MG CAP: 0.4 | 30 days supply | Qty: 30 | Fill #0

## 2019-04-19 MED FILL — PROMETHAZINE 25 MG TABLET: 25 | 3 days supply | Qty: 10 | Fill #0

## 2019-04-19 NOTE — Telephone Encounter (Signed)
Mom requested copy of images.  Mom on HIPAA
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
## 2019-04-20 ENCOUNTER — Telehealth: Payer: Self-pay | Admitting: Medical

## 2019-04-20 ENCOUNTER — Other Ambulatory Visit: Payer: Self-pay | Admitting: Medical

## 2019-04-20 DIAGNOSIS — N200 Calculus of kidney: Secondary | ICD-10-CM | POA: Diagnosis not present

## 2019-04-20 NOTE — Telephone Encounter (Signed)
[facility] for referral and faxed to t# [phone] notes, labs, xray for appointment today at 2:15  Dr. Junious Silk

## 2019-04-23 MED FILL — ALPRAZolam ER 2 MG TB24: 2 | 30 days supply | Qty: 30 | Fill #2

## 2019-04-23 MED FILL — DIAZEPAM 10 MG TABS: 10 | 30 days supply | Qty: 30 | Fill #1

## 2019-04-23 MED FILL — QUETIAPINE FUMARATE 50 MG T: 50 | 30 days supply | Qty: 30 | Fill #2

## 2019-04-23 MED FILL — OXYCODONE-ACETAMINOPHEN 5-3: 5-325 | 2 days supply | Qty: 12 | Fill #0

## 2019-04-27 DIAGNOSIS — R1084 Generalized abdominal pain: Secondary | ICD-10-CM | POA: Diagnosis not present

## 2019-04-27 DIAGNOSIS — N200 Calculus of kidney: Secondary | ICD-10-CM | POA: Diagnosis not present

## 2019-04-27 MED FILL — OXYCODONE-ACETAMINOPHEN 5-3: 5-325 | 5 days supply | Qty: 10 | Fill #0

## 2019-05-03 MED FILL — OXYCODONE-ACETAMINOPHEN 5-3: 5-325 | 2 days supply | Qty: 10 | Fill #0

## 2019-05-10 ENCOUNTER — Other Ambulatory Visit: Payer: Self-pay | Admitting: Medical

## 2019-05-10 DIAGNOSIS — N200 Calculus of kidney: Secondary | ICD-10-CM | POA: Diagnosis not present

## 2019-05-10 DIAGNOSIS — R1084 Generalized abdominal pain: Secondary | ICD-10-CM | POA: Diagnosis not present

## 2019-05-10 DIAGNOSIS — R8271 Bacteriuria: Secondary | ICD-10-CM | POA: Diagnosis not present

## 2019-05-10 MED FILL — DULOXETINE HCL 60 MG CPEP: 60 | 90 days supply | Qty: 180 | Fill #0

## 2019-05-10 NOTE — Telephone Encounter (Signed)
Is this okay to refill? 

## 2019-05-15 ENCOUNTER — Other Ambulatory Visit: Payer: Self-pay | Admitting: Urology

## 2019-05-15 DIAGNOSIS — R109 Unspecified abdominal pain: Secondary | ICD-10-CM | POA: Diagnosis not present

## 2019-05-15 DIAGNOSIS — N200 Calculus of kidney: Secondary | ICD-10-CM | POA: Diagnosis not present

## 2019-05-16 DIAGNOSIS — F3181 Bipolar II disorder: Secondary | ICD-10-CM | POA: Diagnosis not present

## 2019-05-16 MED FILL — TAMSULOSIN HCL 0.4 MG CAP: 0.4 | 30 days supply | Qty: 30 | Fill #0

## 2019-05-21 MED FILL — ALPRAZolam ER 2 MG TB24: 2 | 90 days supply | Qty: 90 | Fill #0

## 2019-05-22 MED FILL — DIAZEPAM 10 MG TABS: 10 | 30 days supply | Qty: 30 | Fill #2

## 2019-05-28 DIAGNOSIS — R0681 Apnea, not elsewhere classified: Secondary | ICD-10-CM | POA: Diagnosis not present

## 2019-05-28 DIAGNOSIS — R404 Transient alteration of awareness: Secondary | ICD-10-CM | POA: Diagnosis not present

## 2019-05-28 DIAGNOSIS — R0902 Hypoxemia: Secondary | ICD-10-CM | POA: Diagnosis not present

## 2019-05-28 DIAGNOSIS — R000 Tachycardia, unspecified: Secondary | ICD-10-CM | POA: Diagnosis not present

## 2019-05-28 DIAGNOSIS — F329 Major depressive disorder, single episode, unspecified: Secondary | ICD-10-CM | POA: Diagnosis not present

## 2019-05-29 MED FILL — QUETIAPINE FUMARATE 50 MG T: 50 | 90 days supply | Qty: 90 | Fill #0

## 2019-06-19 MED FILL — ALPRAZolam ER 2 MG TB24: 2 | 30 days supply | Qty: 30 | Fill #1

## 2019-06-19 MED FILL — DIAZEPAM 10 MG TABS: 10 | 90 days supply | Qty: 90 | Fill #0

## 2019-06-28 DIAGNOSIS — N200 Calculus of kidney: Secondary | ICD-10-CM | POA: Diagnosis not present

## 2019-06-28 MED FILL — OXYCODONE-ACETAMINOPHEN 5-3: 5-325 | 2 days supply | Qty: 8 | Fill #0

## 2019-06-28 MED FILL — ALFUZOSIN HCL ER 10 MG TAB: 10 | 30 days supply | Qty: 30 | Fill #0

## 2019-07-03 MED FILL — PRAMIPEXOLE 0.75 MG TABLET: 0.75 | 30 days supply | Qty: 30 | Fill #0

## 2019-07-12 MED FILL — QUETIAPINE 300 MG TABLET: 300 | 90 days supply | Qty: 90 | Fill #0

## 2019-07-13 MED FILL — ALPRAZolam 0.5 MG TABS: 0.5 | 30 days supply | Qty: 30 | Fill #0

## 2019-07-17 MED FILL — ALPRAZolam ER 2 MG TB24: 2 | 30 days supply | Qty: 30 | Fill #2

## 2019-07-26 DIAGNOSIS — N200 Calculus of kidney: Secondary | ICD-10-CM | POA: Diagnosis not present

## 2019-08-01 DIAGNOSIS — N200 Calculus of kidney: Secondary | ICD-10-CM | POA: Insufficient documentation

## 2019-08-01 DIAGNOSIS — N23 Unspecified renal colic: Secondary | ICD-10-CM | POA: Insufficient documentation

## 2019-08-01 DIAGNOSIS — R109 Unspecified abdominal pain: Secondary | ICD-10-CM | POA: Insufficient documentation

## 2019-08-01 HISTORY — DX: Calculus of kidney: N20.0

## 2019-08-01 HISTORY — DX: Unspecified renal colic: N23

## 2019-08-01 HISTORY — DX: Unspecified abdominal pain: R10.9

## 2019-08-04 DIAGNOSIS — N200 Calculus of kidney: Secondary | ICD-10-CM | POA: Diagnosis not present

## 2019-08-10 MED FILL — DULoxetine HCL 60 MG CPEP: 60 | 90 days supply | Qty: 180 | Fill #0

## 2019-08-13 ENCOUNTER — Encounter: Payer: Self-pay | Admitting: Medical

## 2019-08-13 MED FILL — ALPRAZolam 0.5 MG TABS: 0.5 | 30 days supply | Qty: 30 | Fill #0

## 2019-08-14 MED FILL — ALPRAZolam ER 2 MG TB24: 2 | 90 days supply | Qty: 90 | Fill #0

## 2019-08-21 MED FILL — ZIPSOR 25 MG CAPS: 25 | 30 days supply | Qty: 120 | Fill #0

## 2019-08-27 MED FILL — QUETIAPINE FUMARATE 50 MG T: 50 | 90 days supply | Qty: 90 | Fill #0

## 2019-09-11 DIAGNOSIS — F4312 Post-traumatic stress disorder, chronic: Secondary | ICD-10-CM | POA: Diagnosis not present

## 2019-09-11 DIAGNOSIS — F3181 Bipolar II disorder: Secondary | ICD-10-CM | POA: Diagnosis not present

## 2019-09-11 MED FILL — ALPRAZolam 0.5 MG TABS: 0.5 | 30 days supply | Qty: 30 | Fill #0

## 2019-09-11 MED FILL — DIAZEPAM 10 MG TABS: 10 | 90 days supply | Qty: 90 | Fill #0

## 2019-09-11 MED FILL — METHOCARBAMOL 500 MG TABS: 500 | 90 days supply | Qty: 270 | Fill #0

## 2019-09-11 MED FILL — PRAZOSIN 2 MG CAPSULE: 2 | 90 days supply | Qty: 90 | Fill #0

## 2019-09-25 ENCOUNTER — Other Ambulatory Visit: Payer: Self-pay

## 2019-09-25 DIAGNOSIS — T887XXA Unspecified adverse effect of drug or medicament, initial encounter: Secondary | ICD-10-CM | POA: Diagnosis not present

## 2019-09-25 DIAGNOSIS — R410 Disorientation, unspecified: Secondary | ICD-10-CM | POA: Diagnosis not present

## 2019-09-25 DIAGNOSIS — R0689 Other abnormalities of breathing: Secondary | ICD-10-CM | POA: Diagnosis not present

## 2019-09-25 DIAGNOSIS — T50904A Poisoning by unspecified drugs, medicaments and biological substances, undetermined, initial encounter: Secondary | ICD-10-CM | POA: Diagnosis not present

## 2019-09-25 DIAGNOSIS — R4020 Unspecified coma: Secondary | ICD-10-CM | POA: Diagnosis not present

## 2019-09-25 DIAGNOSIS — M79632 Pain in left forearm: Secondary | ICD-10-CM | POA: Diagnosis present

## 2019-09-25 DIAGNOSIS — Z5321 Procedure and treatment not carried out due to patient leaving prior to being seen by health care provider: Secondary | ICD-10-CM | POA: Diagnosis not present

## 2019-09-26 ENCOUNTER — Other Ambulatory Visit: Payer: Self-pay

## 2019-09-26 ENCOUNTER — Encounter (HOSPITAL_BASED_OUTPATIENT_CLINIC_OR_DEPARTMENT_OTHER): Payer: Self-pay | Admitting: Emergency Medicine

## 2019-09-26 ENCOUNTER — Emergency Department (HOSPITAL_BASED_OUTPATIENT_CLINIC_OR_DEPARTMENT_OTHER)
Admission: EM | Admit: 2019-09-26 | Discharge: 2019-09-26 | Disposition: A | Discharge status: Home / Self Care | Payer: 59 | Attending: Emergency Medicine | Admitting: Emergency Medicine

## 2019-09-26 ENCOUNTER — Emergency Department (HOSPITAL_COMMUNITY): Payer: 59

## 2019-09-26 ENCOUNTER — Encounter: Payer: Self-pay | Admitting: Emergency Medicine

## 2019-09-26 ENCOUNTER — Emergency Department (HOSPITAL_BASED_OUTPATIENT_CLINIC_OR_DEPARTMENT_OTHER): Payer: 59

## 2019-09-26 ENCOUNTER — Emergency Department
Admission: EM | Admit: 2019-09-26 | Discharge: 2019-09-26 | Disposition: A | Discharge status: Home / Self Care | Payer: 59 | Attending: Emergency Medicine | Admitting: Emergency Medicine

## 2019-09-26 DIAGNOSIS — T507X1A Poisoning by analeptics and opioid receptor antagonists, accidental (unintentional), initial encounter: Secondary | ICD-10-CM | POA: Insufficient documentation

## 2019-09-26 DIAGNOSIS — Z79899 Other long term (current) drug therapy: Secondary | ICD-10-CM | POA: Diagnosis not present

## 2019-09-26 DIAGNOSIS — M50223 Other cervical disc displacement at C6-C7 level: Secondary | ICD-10-CM | POA: Diagnosis not present

## 2019-09-26 DIAGNOSIS — F17210 Nicotine dependence, cigarettes, uncomplicated: Secondary | ICD-10-CM | POA: Diagnosis not present

## 2019-09-26 DIAGNOSIS — R29818 Other symptoms and signs involving the nervous system: Secondary | ICD-10-CM | POA: Diagnosis not present

## 2019-09-26 DIAGNOSIS — R202 Paresthesia of skin: Secondary | ICD-10-CM

## 2019-09-26 DIAGNOSIS — S069X9A Unspecified intracranial injury with loss of consciousness of unspecified duration, initial encounter: Secondary | ICD-10-CM

## 2019-09-26 DIAGNOSIS — T402X1A Poisoning by other opioids, accidental (unintentional), initial encounter: Secondary | ICD-10-CM

## 2019-09-26 DIAGNOSIS — J45909 Unspecified asthma, uncomplicated: Secondary | ICD-10-CM | POA: Insufficient documentation

## 2019-09-26 DIAGNOSIS — R404 Transient alteration of awareness: Secondary | ICD-10-CM | POA: Diagnosis present

## 2019-09-26 DIAGNOSIS — R55 Syncope and collapse: Secondary | ICD-10-CM | POA: Diagnosis not present

## 2019-09-26 DIAGNOSIS — S062X9A Diffuse traumatic brain injury with loss of consciousness of unspecified duration, initial encounter: Secondary | ICD-10-CM | POA: Diagnosis not present

## 2019-09-26 LAB — DIFFERENTIAL
Abs Immature Granulocytes: 0.06 10*3/uL (ref 0.00–0.07)
Basophils Absolute: 0 10*3/uL (ref 0.0–0.1)
Basophils Relative: 0 %
Eosinophils Absolute: 0 10*3/uL (ref 0.0–0.5)
Eosinophils Relative: 0 %
Immature Granulocytes: 0 %
Lymphocytes Relative: 9 %
Lymphs Abs: 1.4 10*3/uL (ref 0.7–4.0)
Monocytes Absolute: 0.9 10*3/uL (ref 0.1–1.0)
Monocytes Relative: 6 %
Neutro Abs: 12.7 10*3/uL — ABNORMAL HIGH (ref 1.7–7.7)
Neutrophils Relative %: 85 %

## 2019-09-26 LAB — COMPREHENSIVE METABOLIC PANEL
ALT: 27 U/L (ref 0–44)
AST: 70 U/L — ABNORMAL HIGH (ref 15–41)
Albumin: 4.9 g/dL (ref 3.5–5.0)
Alkaline Phosphatase: 71 U/L (ref 38–126)
Anion gap: 10 (ref 5–15)
BUN: 20 mg/dL (ref 6–20)
CO2: 26 mmol/L (ref 22–32)
Calcium: 9.5 mg/dL (ref 8.9–10.3)
Chloride: 98 mmol/L (ref 98–111)
Creatinine, Ser: 0.78 mg/dL (ref 0.61–1.24)
GFR calc Af Amer: >60 mL/min (ref >60)
GFR calc non Af Amer: >60 mL/min (ref >60)
Glucose, Bld: 89 mg/dL (ref 70–99)
Potassium: 4 mmol/L (ref 3.5–5.1)
Sodium: 134 mmol/L — ABNORMAL LOW (ref 135–145)
Total Bilirubin: 0.6 mg/dL (ref 0.3–1.2)
Total Protein: 8 g/dL (ref 6.5–8.1)

## 2019-09-26 LAB — CBC
HCT: 41 % (ref 39.0–52.0)
Hemoglobin: 14 g/dL (ref 13.0–17.0)
MCH: 31.6 pg (ref 26.0–34.0)
MCHC: 34.1 g/dL (ref 30.0–36.0)
MCV: 92.6 fL (ref 80.0–100.0)
Platelets: 247 10*3/uL (ref 150–400)
RBC: 4.43 MIL/uL (ref 4.22–5.81)
RDW: 12.4 % (ref 11.5–15.5)
WBC: 15.1 10*3/uL — ABNORMAL HIGH (ref 4.0–10.5)
nRBC: 0 % (ref 0.0–0.2)

## 2019-09-26 LAB — PROTIME-INR
INR: 1 (ref 0.8–1.2)
Prothrombin Time: 13.4 seconds (ref 11.4–15.2)

## 2019-09-26 LAB — APTT: aPTT: 37 seconds — ABNORMAL HIGH (ref 24–36)

## 2019-09-26 LAB — ETHANOL: Alcohol, Ethyl (B): <10 mg/dL (ref <10)

## 2019-09-26 NOTE — Discharge Instructions (Addendum)
Please proceed directly to the [facility] emergency department.  Dr. Christy Gentles is expecting you for further evaluation of your suspected stroke.  An MRI of your brain is planned.

## 2019-09-26 NOTE — ED Provider Notes (Signed)
[facility] Provider Note: Georgena Spurling, MD, FACEP  CSN: [id number] MRN: [id number] ARRIVAL: 09/26/19 at [place]: [place]  Numbness   HISTORY OF PRESENT ILLNESS  09/26/19 3:24 AM Carl Hughes is a 28 y.o. male with a history of polysubstance abuse.  He snorts opioids rather than inject them.  About 12:30 PM yesterday he snorted some type of opioid he bought on the street.  This caused him to pass out in the shower and his roommate found him approximately 2 hours later, cyanotic and unresponsive.  EMS administered intranasal Narcan and were unable to obtain a body temperature but suspected he was hypothermic.  He subsequently had multiple episodes of vomiting.  They provided warming.  He refused transport to the hospital despite their efforts at persuasion.  He may have struck his head in the fall and has some mild pain and tenderness of his right parietal scalp.  Since "coming to" he has had paresthesias and numbness of the left forearm and hand in a glove pattern with associated weakness.  The weakness is partial and he is having difficulty with fine movements such as buttoning his pants.  He is also having some 3 out of 10 pain in the right hip.  His mother states he is shuffling when he walks.  He denies lower extremity weakness or numbness.   Past Medical History:  Diagnosis Date  . Anal fissure   . Anxiety   . Bipolar disorder ([place])    Dr. Pearson Grippe, [facility]  . Chronic back pain   . Depression   . Drug-seeking behavior   . Exercise-induced asthma   . Family history of adverse reaction to anesthesia   . Fibromyalgia   . Gallbladder polyp   . GERD (gastroesophageal reflux disease)   . IBS (irritable bowel syndrome)   . Polyarthralgia   . Substance abuse ([place])   . Tobacco use disorder   . Wears glasses     Past Surgical History:  Procedure Laterality Date  . WISDOM TOOTH EXTRACTION      Family History   Problem Relation Age of Onset  . Depression Father   . Other Father        chronic back pain  . Arthritis Father   . Gout Father   . Heart disease Paternal Grandfather   . Depression Paternal Grandfather   . Colon polyps Mother        benign  . Irritable bowel syndrome Mother   . Heart disease Maternal Grandmother   . Diabetes Maternal Grandfather   . Cancer Maternal Grandfather        colorectal  . Hypertension Maternal Grandfather   . Hyperlipidemia Maternal Grandfather     Social History   Tobacco Use  . Smoking status: Current Every Day Smoker    Packs/day: 0.50    Years: 7.00    Pack years: 3.50    Types: Cigarettes  . Smokeless tobacco: Never Used  . Tobacco comment: tobacco info given today  Substance Use Topics  . Alcohol use: No    Alcohol/week: 3.0 standard drinks    Types: 3 Cans of beer per week  . Drug use: Yes    Types: Marijuana    Prior to Admission medications   Medication Sig Start Date End Date Taking? Authorizing Provider  alprazolam Duanne Moron) 2 MG tablet Take 2 mg by mouth at bedtime as needed for sleep.    [provider]  diazepam (VALIUM) 10 MG tablet Take 10 mg by mouth every 6 (six) hours as needed for anxiety.    [provider]  DULoxetine (CYMBALTA) 60 MG capsule Take 2 capsules (120 mg total) by mouth daily. 09/19/18   Pennelope Bracken, MD  hydrOXYzine (ATARAX/VISTARIL) 50 MG tablet Take 1 tablet (50 mg total) by mouth every 6 (six) hours as needed for anxiety. Patient not taking: Reported on 04/19/2019 09/18/18   Pennelope Bracken, MD  lidocaine (LIDODERM) 5 % Place 1 patch onto the skin daily. Remove & Discard patch within 12 hours or as directed by MD 11/02/18   Tysinger, Camelia Eng, PA-C  mirtazapine (REMERON) 7.5 MG tablet Take 1 tablet (7.5 mg total) by mouth at bedtime. Patient not taking: Reported on 04/19/2019 09/18/18   Pennelope Bracken, MD  oxyCODONE-acetaminophen (PERCOCET) 5-325 MG tablet  Take 1 tablet by mouth every 6 (six) hours as needed for severe pain. 04/19/19   Tysinger, Camelia Eng, PA-C  pramipexole (MIRAPEX) 0.75 MG tablet Take 0.75 mg by mouth 3 (three) times daily.    [provider]  promethazine (PHENERGAN) 25 MG tablet Take 1 tablet (25 mg total) by mouth every 8 (eight) hours as needed for nausea or vomiting. 04/19/19   Tysinger, Camelia Eng, PA-C  QUEtiapine (SEROQUEL) 300 MG tablet Take 1 tablet (300 mg total) by mouth at bedtime. 09/18/18   Pennelope Bracken, MD  tamsulosin (FLOMAX) 0.4 MG CAPS capsule Take 1 capsule (0.4 mg total) by mouth daily. 04/19/19   Tysinger, Camelia Eng, PA-C    Allergies Zofran Alvis Lemmings hcl], Buspar [buspirone hcl], Ibuprofen [ibuprofen], Lamictal [lamotrigine], and Toradol [ketorolac tromethamine]   REVIEW OF SYSTEMS  Negative except as noted here or in the History of Present Illness.   PHYSICAL EXAMINATION  Initial Vital Signs Blood pressure 106/73, pulse 95, temperature 98.3 F (36.8 C), temperature source Oral, resp. rate 14, height 6\' 3"  (1.905 m), weight 72.6 kg, SpO2 98 %.  Examination General: Well-developed, well-nourished male in no acute distress; appearance consistent with age of record HENT: normocephalic; mild parietal scalp tenderness without hematoma, bony deformity or crepitus; no hemotympanum Eyes: pupils equal, round and reactive to light; extraocular muscles intact Neck: supple; no C-spine tenderness Heart: regular rate and rhythm Lungs: clear to auscultation bilaterally Abdomen: soft; nondistended; nontender; bowel sounds present Extremities: No deformity; full range of motion; pulses normal Neurologic: Awake, alert and oriented; strength plus 4 out of 5 in left upper extremity with difficulty performing fine motor functions with left hand, no deficits noted to remaining extremities; decreased sensation in left forearm and hand and asked glove pattern; no facial droop Skin: Warm and  dry Psychiatric: Normal mood and affect   RESULTS  Summary of this visit's results, reviewed and interpreted by myself:   EKG Interpretation  Date/Time:  Wednesday September 26 2019 01:46:22 EST Ventricular Rate:  89 PR Interval:    QRS Duration: 93 QT Interval:  350 QTC Calculation: 426 R Axis:   88 Text Interpretation: Sinus rhythm RSR' in V1 or V2, probably normal variant ST elev, probable normal early repol pattern No significant change was found Confirmed by Acelin Ferdig, Jenny Reichmann [phone]) on 09/26/2019 1:52:07 AM      Laboratory Studies: Results for orders placed or performed during the hospital encounter of 09/26/19 (from the past 24 hour(s))  Ethanol     Status: None   Collection Time: 09/26/19  1:49 AM  Result Value Ref Range   Alcohol, Ethyl (B) <10 <10  mg/dL  Protime-INR     Status: None   Collection Time: 09/26/19  1:49 AM  Result Value Ref Range   Prothrombin Time 13.4 11.4 - 15.2 seconds   INR 1.0 0.8 - 1.2  APTT     Status: Abnormal   Collection Time: 09/26/19  1:49 AM  Result Value Ref Range   aPTT 37 (H) 24 - 36 seconds  CBC     Status: Abnormal   Collection Time: 09/26/19  1:49 AM  Result Value Ref Range   WBC 15.1 (H) 4.0 - 10.5 K/uL   RBC 4.43 4.22 - 5.81 MIL/uL   Hemoglobin 14.0 13.0 - 17.0 g/dL   HCT 41.0 39.0 - 52.0 %   MCV 92.6 80.0 - 100.0 fL   MCH 31.6 26.0 - 34.0 pg   MCHC 34.1 30.0 - 36.0 g/dL   RDW 12.4 11.5 - 15.5 %   Platelets 247 150 - 400 K/uL   nRBC 0.0 0.0 - 0.2 %  Differential     Status: Abnormal   Collection Time: 09/26/19  1:49 AM  Result Value Ref Range   Neutrophils Relative % 85 %   Neutro Abs 12.7 (H) 1.7 - 7.7 K/uL   Lymphocytes Relative 9 %   Lymphs Abs 1.4 0.7 - 4.0 K/uL   Monocytes Relative 6 %   Monocytes Absolute 0.9 0.1 - 1.0 K/uL   Eosinophils Relative 0 %   Eosinophils Absolute 0.0 0.0 - 0.5 K/uL   Basophils Relative 0 %   Basophils Absolute 0.0 0.0 - 0.1 K/uL   Immature Granulocytes 0 %   Abs Immature Granulocytes  0.06 0.00 - 0.07 K/uL  Comprehensive metabolic panel     Status: Abnormal   Collection Time: 09/26/19  1:49 AM  Result Value Ref Range   Sodium 134 (L) 135 - 145 mmol/L   Potassium 4.0 3.5 - 5.1 mmol/L   Chloride 98 98 - 111 mmol/L   CO2 26 22 - 32 mmol/L   Glucose, Bld 89 70 - 99 mg/dL   BUN 20 6 - 20 mg/dL   Creatinine, Ser 0.78 0.61 - 1.24 mg/dL   Calcium 9.5 8.9 - 10.3 mg/dL   Total Protein 8.0 6.5 - 8.1 g/dL   Albumin 4.9 3.5 - 5.0 g/dL   AST 70 (H) 15 - 41 U/L   ALT 27 0 - 44 U/L   Alkaline Phosphatase 71 38 - 126 U/L   Total Bilirubin 0.6 0.3 - 1.2 mg/dL   GFR calc non Af Amer >60 >60 mL/min   GFR calc Af Amer >60 >60 mL/min   Anion gap 10 5 - 15   Imaging Studies: Ct Head Code Stroke Wo Contrast  Result Date: 09/26/2019 CLINICAL DATA:  Code stroke. 28 year old male status post narcotic overdose. Left upper extremity numbness and weakness. EXAM: CT HEAD WITHOUT CONTRAST TECHNIQUE: Contiguous axial images were obtained from the base of the skull through the vertex without intravenous contrast. COMPARISON:  Head CT 08/18/2018. FINDINGS: Brain: No midline shift, ventriculomegaly, mass effect, evidence of mass lesion, intracranial hemorrhage or evidence of cortically based acute infarction. Gray-white matter differentiation is within normal limits throughout the brain. Vascular: No suspicious intracranial vascular hyperdensity. Skull: Negative. Sinuses/Orbits: Visualized paranasal sinuses and mastoids are clear. Other: Visualized orbits and scalp soft tissues are within normal limits. ASPECTS [facility] Stroke Program Early CT Score) Total score (0-10 with 10 being normal): 10 IMPRESSION: Stable and normal for age non contrast CT appearance of the brain. ASPECTS  10. Study discussed by telephone with Dr. Shanon Rosser on 09/26/2019 at 02:06 . Electronically Signed   By: Genevie Ann M.D.   On: 09/26/2019 02:07    ED COURSE and MDM  Nursing notes, initial and subsequent vitals signs, including  pulse oximetry, reviewed and interpreted by myself.  Vitals:   09/26/19 0109 09/26/19 0112  BP:  106/73  Pulse:  95  Resp:  14  Temp:  98.3 F (36.8 C)  TempSrc:  Oral  SpO2:  98%  Weight: 72.6 kg   Height: 6\' 3"  (1.905 m)    Medications - No data to display  1:45 AM Stroke protocol initiated. Patient does not meet Code Stroke criteria.  3:23 AM Spoke with Dr. Christy Gentles, EDP at [facility] ED.  He accepts for transfer for MRI to evaluate for acute stroke not seen on CT scan.  We will transfer by private vehicle.  PROCEDURES  Procedures   ED DIAGNOSES     ICD-10-CM   1. Opioid overdose, accidental or unintentional, initial encounter ([place])  T40.2X1A   2. Altered mobility due to acute stroke ([facility])  I63.9    R26.9   3. Altered sensation due to acute stroke [facility])  I63.9    R44.9        Byanka Landrus, Jenny Reichmann, MD 09/26/19 [phone]

## 2019-09-26 NOTE — ED Notes (Signed)
Patient transported to MRI 

## 2019-09-26 NOTE — ED Triage Notes (Signed)
Patient to ER for c/o left arm pain from elbow down. Patient reports overdosing on unknown pain medication ("a little bit of powder"). Patient states "I was out for like 2-3 hours when they found me and they had to give me Narcan.". Patient alert and oriented at this time. No other complaints other than arm pain.

## 2019-09-26 NOTE — ED Triage Notes (Signed)
Pt states he overdosed on something earlier today States it was something he snorted  Pt states he laid on the floor for several hours  Pt states they broke down the door and he was given Narcan  Pt was blue when they found him  Pt states he has had several episodes of vomiting since then  Mother states pt could not hear for a long time  Pt is c/o left arm numbness from his elbow down   Mother states he is shuffling when he is walking

## 2019-09-26 NOTE — ED Notes (Signed)
Returned from CT.

## 2019-09-26 NOTE — ED Provider Notes (Signed)
Patient presents as a transfer from [facility] Patient was sent for further evaluation of left arm weakness and numbness.  Patient apparently had an opioid overdose yesterday and required Narcan to be revived.  Since that time he has had weakness/numbness in left arm.   Patient is awake alert this time Patient wearing sunglasses He has some difficulty with grip of left hand.  He reports paresthesias throughout the left forearm.  Otherwise there is no neuro deficits in left arm. He denies any new neck pain, but does report chronic neck pain. We will obtain MRI brain and cervical spine.  If negative patient will be discharged home   Ripley Fraise, MD 09/26/19 [phone]

## 2019-09-26 NOTE — ED Notes (Signed)
..  Patient verbalizes understanding of discharge instructions. Opportunity for questioning and answers were provided. Armband removed by staff. Patient discharged from ED. Signature pad not available for patient to use.

## 2019-09-26 NOTE — ED Provider Notes (Signed)
Discussed MRI results with Dr. Rory Percy with neurology.  This is a classic presentation of a toxic insult after a prolonged overdose. No other acute intervention required.  This was discussed with patient and his mother with his permission.  Patient reports he is already feeling improved.  He would like to be discharged home.  Offered resources for opiate abuse but he declined.  He will be referred to neuro if his symptoms return   Ripley Fraise, MD 09/26/19 [phone]

## 2019-10-08 MED FILL — QUETIAPINE FUMARATE 300 MG: 300 | 90 days supply | Qty: 90 | Fill #0

## 2019-10-09 MED FILL — ALPRAZolam 0.5 MG TABS: 0.5 | 30 days supply | Qty: 30 | Fill #1

## 2019-10-25 DIAGNOSIS — H5213 Myopia, bilateral: Secondary | ICD-10-CM | POA: Diagnosis not present

## 2019-10-26 DIAGNOSIS — H5213 Myopia, bilateral: Secondary | ICD-10-CM | POA: Diagnosis not present

## 2019-11-05 ENCOUNTER — Other Ambulatory Visit: Payer: Self-pay | Admitting: Medical

## 2019-11-05 MED FILL — PRAMIPEXOLE 0.75 MG TABLET: 0.75 | 90 days supply | Qty: 90 | Fill #0

## 2019-11-05 MED FILL — DULoxetine HCL 60 MG CPEP: 60 | 90 days supply | Qty: 180 | Fill #0

## 2019-11-07 MED FILL — LIDOCAINE PATCH 5%: 5 | 90 days supply | Qty: 90 | Fill #0

## 2019-11-08 DIAGNOSIS — Z760 Encounter for issue of repeat prescription: Secondary | ICD-10-CM | POA: Diagnosis not present

## 2019-11-08 MED FILL — ALPRAZolam 0.5 MG TABS: 0.5 | 30 days supply | Qty: 30 | Fill #2

## 2019-11-12 MED FILL — ALPRAZolam ER 2 MG TB24: 2 | 90 days supply | Qty: 90 | Fill #0

## 2019-11-26 MED FILL — QUETIAPINE FUMARATE 50 MG T: 50 | 90 days supply | Qty: 90 | Fill #0

## 2019-12-04 DIAGNOSIS — F3181 Bipolar II disorder: Secondary | ICD-10-CM | POA: Diagnosis not present

## 2019-12-04 DIAGNOSIS — F4312 Post-traumatic stress disorder, chronic: Secondary | ICD-10-CM | POA: Diagnosis not present

## 2019-12-10 MED FILL — ALPRAZolam 0.5 MG TABS: 0.5 | 30 days supply | Qty: 30 | Fill #3

## 2019-12-10 MED FILL — QUETIAPINE FUMARATE 400 MG: 400 | 90 days supply | Qty: 90 | Fill #0

## 2019-12-14 MED FILL — DIAZEPAM 10 MG TABS: 10 | 30 days supply | Qty: 30 | Fill #0

## 2020-01-02 MED FILL — cloNIDine HCL 0.2 MG TABS: 0.2 | 30 days supply | Qty: 30 | Fill #0

## 2020-01-07 ENCOUNTER — Other Ambulatory Visit: Payer: Self-pay

## 2020-01-07 ENCOUNTER — Encounter: Payer: Self-pay | Admitting: Medical

## 2020-01-07 ENCOUNTER — Ambulatory Visit (INDEPENDENT_AMBULATORY_CARE_PROVIDER_SITE_OTHER): Payer: 59 | Admitting: Medical

## 2020-01-07 VITALS — Ht 75.0 in | Wt 170.0 lb

## 2020-01-07 DIAGNOSIS — R42 Dizziness and giddiness: Secondary | ICD-10-CM | POA: Insufficient documentation

## 2020-01-07 DIAGNOSIS — R55 Syncope and collapse: Secondary | ICD-10-CM | POA: Diagnosis not present

## 2020-01-07 DIAGNOSIS — F1290 Cannabis use, unspecified, uncomplicated: Secondary | ICD-10-CM

## 2020-01-07 DIAGNOSIS — F332 Major depressive disorder, recurrent severe without psychotic features: Secondary | ICD-10-CM

## 2020-01-07 DIAGNOSIS — R251 Tremor, unspecified: Secondary | ICD-10-CM | POA: Diagnosis not present

## 2020-01-07 DIAGNOSIS — R252 Cramp and spasm: Secondary | ICD-10-CM

## 2020-01-07 HISTORY — DX: Syncope and collapse: R55
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
## 2020-01-08 MED FILL — ALPRAZolam 0.5 MG TABS: 0.5 | 30 days supply | Qty: 30 | Fill #4

## 2020-01-11 ENCOUNTER — Other Ambulatory Visit: Payer: 59

## 2020-01-11 DIAGNOSIS — F332 Major depressive disorder, recurrent severe without psychotic features: Secondary | ICD-10-CM | POA: Diagnosis not present

## 2020-01-11 DIAGNOSIS — R42 Dizziness and giddiness: Secondary | ICD-10-CM

## 2020-01-11 DIAGNOSIS — F1290 Cannabis use, unspecified, uncomplicated: Secondary | ICD-10-CM

## 2020-01-11 DIAGNOSIS — R252 Cramp and spasm: Secondary | ICD-10-CM | POA: Diagnosis not present

## 2020-01-11 DIAGNOSIS — R251 Tremor, unspecified: Secondary | ICD-10-CM

## 2020-01-11 MED FILL — DIAZEPAM 10 MG TABS: 10 | 30 days supply | Qty: 30 | Fill #1

## 2020-01-16 ENCOUNTER — Telehealth: Payer: Self-pay | Admitting: Family Medicine

## 2020-01-16 NOTE — Telephone Encounter (Signed)
Mom on HIPAA.  Mom called to obtain copy of labs.  Printed same

## 2020-01-17 LAB — BASIC METABOLIC PANEL
BUN/Creatinine Ratio: 13 (ref 9–20)
BUN: 11 mg/dL (ref 6–20)
CO2: 26 mmol/L (ref 20–29)
Calcium: 9.7 mg/dL (ref 8.7–10.2)
Chloride: 103 mmol/L (ref 96–106)
Creatinine, Ser: 0.87 mg/dL (ref 0.76–1.27)
GFR calc Af Amer: 137 mL/min/{1.73_m2} (ref >59)
GFR calc non Af Amer: 118 mL/min/{1.73_m2} (ref >59)
Glucose: 91 mg/dL (ref 65–99)
Potassium: 4.5 mmol/L (ref 3.5–5.2)
Sodium: 142 mmol/L (ref 134–144)

## 2020-01-17 LAB — CBC
Hematocrit: 43.7 % (ref 37.5–51.0)
Hemoglobin: 15.1 g/dL (ref 13.0–17.7)
MCH: 32 pg (ref 26.6–33.0)
MCHC: 34.6 g/dL (ref 31.5–35.7)
MCV: 93 fL (ref 79–97)
Platelets: 288 10*3/uL (ref 150–450)
RBC: 4.72 x10E6/uL (ref 4.14–5.80)
RDW: 13 % (ref 11.6–15.4)
WBC: 7.8 10*3/uL (ref 3.4–10.8)

## 2020-01-17 LAB — TSH: TSH: 1.44 u[IU]/mL (ref 0.450–4.500)

## 2020-01-17 LAB — VITAMIN B12: Vitamin B-12: 1034 pg/mL (ref 232–1245)

## 2020-01-17 LAB — MAGNESIUM: Magnesium: 1.9 mg/dL (ref 1.6–2.3)

## 2020-01-17 LAB — VITAMIN B1: Thiamine: 165.1 nmol/L (ref 66.5–200.0)

## 2020-01-17 LAB — FOLATE: Folate: 15.6 ng/mL (ref >3.0)

## 2020-01-29 MED FILL — cloNIDine HCL 0.2 MG TABS: 0.2 | 30 days supply | Qty: 30 | Fill #0

## 2020-01-29 MED FILL — PRAMIPEXOLE 0.75 MG TABLET: 0.75 | 90 days supply | Qty: 90 | Fill #1

## 2020-02-08 MED FILL — DIAZEPAM 10 MG TABS: 10 | 30 days supply | Qty: 30 | Fill #2

## 2020-02-08 MED FILL — ALPRAZolam 0.5 MG TABS: 0.5 | 30 days supply | Qty: 30 | Fill #5

## 2020-02-11 MED FILL — DULoxetine HCL 60 MG CPEP: 60 | 90 days supply | Qty: 180 | Fill #1

## 2020-02-12 MED FILL — ALPRAZolam ER 2 MG TB24: 2 | 90 days supply | Qty: 90 | Fill #1

## 2020-03-03 ENCOUNTER — Encounter: Payer: Self-pay | Admitting: Neurology

## 2020-03-03 ENCOUNTER — Other Ambulatory Visit: Payer: Self-pay

## 2020-03-03 ENCOUNTER — Ambulatory Visit (INDEPENDENT_AMBULATORY_CARE_PROVIDER_SITE_OTHER): Payer: 59 | Admitting: Neurology

## 2020-03-03 VITALS — BP 122/81 | HR 80 | Resp 20 | Ht 75.0 in | Wt 195.0 lb

## 2020-03-03 DIAGNOSIS — R569 Unspecified convulsions: Secondary | ICD-10-CM | POA: Diagnosis not present

## 2020-03-03 MED FILL — cloNIDine HCL 0.2 MG TABS: 0.2 | 30 days supply | Qty: 30 | Fill #0

## 2020-03-03 MED FILL — QUETIAPINE FUMARATE 400 MG: 400 | 90 days supply | Qty: 90 | Fill #0
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
## 2020-03-03 NOTE — Patient Instructions (Signed)
1.  Will check routine EEG.  If that is unremarkable, then will check an ambulatory EEG. 2.  Follow up after testing.

## 2020-03-17 ENCOUNTER — Other Ambulatory Visit: Payer: Self-pay

## 2020-03-17 ENCOUNTER — Ambulatory Visit (INDEPENDENT_AMBULATORY_CARE_PROVIDER_SITE_OTHER): Payer: 59 | Admitting: Neurology

## 2020-03-17 DIAGNOSIS — R569 Unspecified convulsions: Secondary | ICD-10-CM

## 2020-03-18 ENCOUNTER — Telehealth: Payer: Self-pay

## 2020-03-18 ENCOUNTER — Telehealth: Payer: Self-pay | Admitting: Neurology

## 2020-03-18 ENCOUNTER — Other Ambulatory Visit: Payer: Self-pay

## 2020-03-18 DIAGNOSIS — R569 Unspecified convulsions: Secondary | ICD-10-CM

## 2020-03-18 NOTE — Telephone Encounter (Signed)
LMOVM to call back in regards to EEG results

## 2020-03-18 NOTE — Telephone Encounter (Signed)
Patient's mother is returning our phone call

## 2020-03-18 NOTE — Procedures (Signed)
ELECTROENCEPHALOGRAM REPORT  Date of Study: 03/17/2020  Patient's Name: Carl Hughes MRN: [id number] Date of Birth: [birth year]  Clinical History: 29 year old male with mood disorder, chronic pain syndrome and anxiety recurrent seizure-like episodes.  He took Xanax prior to study due to anxiety.  Medications: XANAX 2 MG tablet CATAPRES 0.2 MG tablet VALIUM 10 MG tablet CYMBALTA 60 MG capsule LIDODERM 5 % REMERON 7.5 MG tablet PERCOCET 5-325 MG tablet MIRAPEX 0.75 MG tablet PHENERGAN 25 MG tablet SEROQUEL 400 MG tablet  Technical Summary: A multichannel digital EEG recording measured by the international 10-20 system with electrodes applied with paste and impedances below 5000 ohms performed in our laboratory with EKG monitoring in an awake and drowsy patient.  Hyperventilation not performed as patient is wearing mask due to COVID-19 pandemic.  Photic stimulation was performed.  The digital EEG was referentially recorded, reformatted, and digitally filtered in a variety of bipolar and referential montages for optimal display.    Description: The patient is awake and drowsy during the recording.  During maximal wakefulness, there is a symmetric, medium voltage 11-12 Hz posterior dominant rhythm that attenuates with eye opening.  The record is symmetric.  During drowsiness, there is an increase in theta slowing of the background.  Stage 2 sleep was not seen.  Photic stimulation did not elicit any abnormalities.  There were no epileptiform discharges or electrographic seizures seen.    EKG lead was unremarkable.  Impression: This awake and drowsy EEG is normal.    Clinical Correlation: A normal EEG does not exclude a clinical diagnosis of epilepsy.  If further clinical questions remain, prolonged EEG may be helpful.  Clinical correlation is advised.   Metta Clines, DO

## 2020-03-18 NOTE — Progress Notes (Unsigned)
Ambulatory EEG added to pt chart per DR. Jaffe.

## 2020-03-18 NOTE — Telephone Encounter (Signed)
Returned mother's call at 1:42pn. LMOVM

## 2020-03-31 ENCOUNTER — Ambulatory Visit (INDEPENDENT_AMBULATORY_CARE_PROVIDER_SITE_OTHER): Payer: 59 | Admitting: Neurology

## 2020-03-31 ENCOUNTER — Other Ambulatory Visit: Payer: Self-pay

## 2020-03-31 DIAGNOSIS — R569 Unspecified convulsions: Secondary | ICD-10-CM

## 2020-04-01 MED FILL — cloNIDine HCL 0.2 MG TABS: 0.2 | 30 days supply | Qty: 30 | Fill #1

## 2020-04-08 ENCOUNTER — Telehealth: Payer: Self-pay

## 2020-04-08 NOTE — Telephone Encounter (Signed)
-----   Message from Pieter Partridge, DO sent at 04/08/2020  7:33 AM EDT ----- Ambulatory EEG is normal

## 2020-04-08 NOTE — Telephone Encounter (Signed)
Advised Pt mother to call if she wanted to schedule a f/u visit.

## 2020-04-08 NOTE — Telephone Encounter (Signed)
Pt mother called back. Wanted to know what the next step could be?

## 2020-04-08 NOTE — Progress Notes (Signed)
ELECTROENCEPHALOGRAM REPORT  Dates of Recording: 03/31/2020 at 13:02 to 04/03/2020 at 13:57  Patient's Name: Carl Hughes MRN: [id number] Date of Birth: [birth year]   Procedure: 72-hour ambulatory EEG  History: 29 year old male evaluated for recurrent seizure-like activity presenting as bilateral convulsions with intact consciousness.  Medications:  XANAX 2 MG tablet CATAPRES 0.2 MG tablet VALIUM 10 MG tablet CYMBALTA 60 MG capsule LIDODERM 5 % REMERON 7.5 MG tablet PERCOCET 5-325 MG tablet MIRAPEX 0.75 MG tablet PHENERGAN 25 MG tablet SEROQUEL 400 MG tablet FLOMAX 0.4 MG CAPS capsule  Technical Summary: This is a 72-hour multichannel digital EEG recording measured by the international 10-20 system with electrodes applied with paste and impedances below 5000 ohms performed as portable with EKG monitoring.  The digital EEG was referentially recorded, reformatted, and digitally filtered in a variety of bipolar and referential montages for optimal display.    DESCRIPTION OF RECORDING: During maximal wakefulness, the background activity consisted of a symmetric 11Hz  posterior dominant rhythm which was reactive to eye opening.  There were no epileptiform discharges or focal slowing seen in wakefulness.  During the recording, the patient progresses through wakefulness, drowsiness, and Stage 2 sleep.  Again, there were no epileptiform discharges seen.  Events:  He reported a "sensation like it's about to happen" on 04/01/2020 at 15:30.  There were no electrographic seizures seen.  EKG lead was unremarkable.  IMPRESSION: This 72-hour ambulatory EEG study is normal.    CLINICAL CORRELATION: A normal EEG does not exclude a clinical diagnosis of epilepsy.  If further clinical questions remain, inpatient video EEG monitoring may be helpful.   Metta Clines, DO

## 2020-04-08 NOTE — Telephone Encounter (Signed)
He may make a follow up appointment to discuss further.  If these spells occur frequently, we can refer for inpatient EEG monitoring.

## 2020-04-08 NOTE — Telephone Encounter (Signed)
LMOVM

## 2020-05-01 MED FILL — PRAMIPEXOLE 0.75 MG TABLET: 0.75 | 90 days supply | Qty: 90 | Fill #0

## 2020-05-01 MED FILL — cloNIDine HCL 0.2 MG TABS: 0.2 | 30 days supply | Qty: 30 | Fill #2

## 2020-05-01 MED FILL — DIAZEPAM 10 MG TABS: 10 | 30 days supply | Qty: 30 | Fill #1

## 2020-05-13 MED FILL — ALPRAZolam ER 2 MG TB24: 2 | 30 days supply | Qty: 30 | Fill #0

## 2020-05-15 MED FILL — DULoxetine HCL 60 MG CPEP: 60 | 30 days supply | Qty: 60 | Fill #0

## 2020-05-26 MED FILL — cloNIDine HCL 0.2 MG TABS: 0.2 | 30 days supply | Qty: 30 | Fill #0

## 2020-05-29 MED FILL — QUETIAPINE FUMARATE 400 MG: 400 | 30 days supply | Qty: 30 | Fill #0

## 2020-05-29 MED FILL — DIAZEPAM 10 MG TABS: 10 | 30 days supply | Qty: 30 | Fill #2

## 2020-06-10 ENCOUNTER — Other Ambulatory Visit (HOSPITAL_COMMUNITY): Payer: Self-pay | Admitting: Psychiatry

## 2020-06-10 DIAGNOSIS — F4312 Post-traumatic stress disorder, chronic: Secondary | ICD-10-CM | POA: Diagnosis not present

## 2020-06-11 MED FILL — DULoxetine HCL 60 MG CPEP: 60 | 30 days supply | Qty: 60 | Fill #1

## 2020-06-11 MED FILL — ALPRAZolam ER 2 MG TB24: 2 | 30 days supply | Qty: 30 | Fill #1

## 2020-06-19 MED FILL — cloNIDine HCL 0.2 MG TABS: 0.2 | 90 days supply | Qty: 90 | Fill #0

## 2020-06-23 MED FILL — QUETIAPINE FUMARATE 400 MG: 400 | 90 days supply | Qty: 90 | Fill #0

## 2020-06-23 MED FILL — ALPRAZolam 0.5 MG TABS: 0.5 | 30 days supply | Qty: 30 | Fill #0

## 2020-07-01 MED FILL — DIAZEPAM 10 MG TABS: 10 | 30 days supply | Qty: 30 | Fill #0

## 2020-07-10 MED FILL — DULoxetine HCL 60 MG CPEP: 60 | 30 days supply | Qty: 60 | Fill #2

## 2020-07-10 MED FILL — ALPRAZolam ER 2 MG TB24: 2 | 30 days supply | Qty: 30 | Fill #2

## 2020-07-23 MED FILL — ALPRAZolam 0.5 MG TABS: 0.5 | 30 days supply | Qty: 30 | Fill #1

## 2020-07-31 MED FILL — DIAZEPAM 10 MG TABS: 10 | 30 days supply | Qty: 30 | Fill #1

## 2020-08-01 MED FILL — PRAMIPEXOLE 0.75 MG TABLET: 0.75 | 90 days supply | Qty: 90 | Fill #0

## 2020-08-11 MED FILL — ALPRAZolam ER 2 MG TB24: 2 | 30 days supply | Qty: 30 | Fill #0

## 2020-08-13 MED FILL — DULoxetine HCL 60 MG CPEP: 60 | 90 days supply | Qty: 180 | Fill #0

## 2020-08-26 MED FILL — ALPRAZolam 0.5 MG TABS: 0.5 | 30 days supply | Qty: 30 | Fill #2

## 2020-08-29 MED FILL — DIAZEPAM 10 MG TABS: 10 | 30 days supply | Qty: 30 | Fill #2

## 2020-09-08 ENCOUNTER — Encounter: Payer: Self-pay | Admitting: Medical

## 2020-09-08 ENCOUNTER — Other Ambulatory Visit: Payer: Self-pay

## 2020-09-08 ENCOUNTER — Ambulatory Visit (INDEPENDENT_AMBULATORY_CARE_PROVIDER_SITE_OTHER): Payer: 59 | Admitting: Medical

## 2020-09-08 VITALS — BP 114/78 | HR 92 | Ht 75.0 in | Wt 189.2 lb

## 2020-09-08 DIAGNOSIS — Z832 Family history of diseases of the blood and blood-forming organs and certain disorders involving the immune mechanism: Secondary | ICD-10-CM

## 2020-09-08 DIAGNOSIS — R21 Rash and other nonspecific skin eruption: Secondary | ICD-10-CM | POA: Diagnosis not present

## 2020-09-08 DIAGNOSIS — G5710 Meralgia paresthetica, unspecified lower limb: Secondary | ICD-10-CM

## 2020-09-08 DIAGNOSIS — R202 Paresthesia of skin: Secondary | ICD-10-CM

## 2020-09-08 DIAGNOSIS — R231 Pallor: Secondary | ICD-10-CM

## 2020-09-08 DIAGNOSIS — Z79899 Other long term (current) drug therapy: Secondary | ICD-10-CM | POA: Insufficient documentation

## 2020-09-08 HISTORY — DX: Family history of diseases of the blood and blood-forming organs and certain disorders involving the immune mechanism: Z83.2

## 2020-09-08 HISTORY — DX: Paresthesia of skin: R20.2

## 2020-09-08 HISTORY — DX: Meralgia paresthetica, unspecified lower limb: G57.10

## 2020-09-08 HISTORY — DX: Pallor: R23.1

## 2020-09-08 HISTORY — DX: Rash and other nonspecific skin eruption: R21

## 2020-09-08 MED FILL — ALPRAZolam ER 2 MG TB24: 2 | 30 days supply | Qty: 30 | Fill #1

## 2020-09-08 NOTE — Progress Notes (Signed)
Subjective:  Carl Hughes is a 29 y.o. male who presents for Chief Complaint  Patient presents with  . Leg Pain    upper right thigh   . Consult    for spots on back     Here for couple different concerns.  He needs skin of his back is mottled appearing lately.  This is been going on for 3 months.  He denies fever, night sweats, no bleeding, no generalized itching, no abdominal pain, no other rash.  He notes right lateral lower thigh with a burning sensation, feels like fire in his leg.  No swelling of the leg no asymmetry from left to right leg.  No fall, no trauma, no injury.  He denies wearing a heavy tool belt.  No tight clothing.  She does not sit crosslegged.  He did have an overdose and was seen in emergency departments ago.  Not sure if he injured his leg and somehow.  Has had the symptoms for several months now  No other aggravating or relieving factors.    No other c/o.  Past Medical History:  Diagnosis Date  . Anal fissure   . Anxiety   . Bipolar disorder ([place])    Dr. Pearson Grippe, [facility]  . Chronic back pain   . Depression   . Drug-seeking behavior   . Exercise-induced asthma   . Family history of adverse reaction to anesthesia   . Fibromyalgia   . Gallbladder polyp   . GERD (gastroesophageal reflux disease)   . IBS (irritable bowel syndrome)   . Polyarthralgia   . Substance abuse ([place])   . Tobacco use disorder   . Wears glasses    Current Outpatient Medications on File Prior to Visit  Medication Sig Dispense Refill  . ALPRAZolam (XANAX) 0.5 MG tablet Take 0.5 mg by mouth daily as needed.    Marland Kitchen alprazolam (XANAX) 2 MG tablet Take 2 mg by mouth at bedtime as needed for sleep.    . cloNIDine (CATAPRES) 0.2 MG tablet Take 0.2 mg by mouth at bedtime.    . diazepam (VALIUM) 10 MG tablet     . DULoxetine (CYMBALTA) 60 MG capsule Take 2 capsules (120 mg total) by mouth daily. 30 capsule 1  . lidocaine (LIDODERM) 5 % APPLY 1 PATCH TOPICALLY EACH  DAY. REMOVE AND DISCARD PATCH WITHIN 12 HOURS AS DIRECTED 90 patch 0  . oxyCODONE-acetaminophen (PERCOCET) 5-325 MG tablet Take 1 tablet by mouth every 6 (six) hours as needed for severe pain. 12 tablet 0  . pramipexole (MIRAPEX) 0.75 MG tablet Take 0.75 mg by mouth at bedtime.    Marland Kitchen QUEtiapine (SEROQUEL) 400 MG tablet Take 400 mg by mouth at bedtime.    . ALPRAZolam (XANAX XR) 2 MG 24 hr tablet Take 2 mg by mouth at bedtime. (Patient not taking: Reported on 09/08/2020)    . mirtazapine (REMERON) 7.5 MG tablet Take 7.5 mg by mouth at bedtime. (Patient not taking: Reported on 09/08/2020)    . promethazine (PHENERGAN) 25 MG tablet Take 1 tablet (25 mg total) by mouth every 8 (eight) hours as needed for nausea or vomiting. (Patient not taking: Reported on 03/03/2020) 10 tablet 0  . tamsulosin (FLOMAX) 0.4 MG CAPS capsule Take 1 capsule (0.4 mg total) by mouth daily. (Patient not taking: Reported on 03/03/2020) 30 capsule 0   No current facility-administered medications on file prior to visit.     The following portions of the patient's history were reviewed and  updated as appropriate: allergies, current medications, past family history, past medical history, past social history, past surgical history and problem list.  ROS Otherwise as in subjective above   Objective: BP 114/78   Pulse 92   Ht 6\' 3"  (1.905 m)   Wt 189 lb 3.2 oz (85.8 kg)   SpO2 97%   BMI 23.65 kg/m   General appearance: alert, no distress, well developed, well nourished Skin: reticular pattern throughout mid to lower back, but no other rash or skin findings No LE edema Pulses WNL upper and lower Right legs nontender, no mass, no swelling, no deformity or atrophy Legs neurovascularly intact    Assessment: Encounter Diagnoses  Name Primary?  . Skin rash Yes  . Livedo reticularis   . Family history of autoimmune disorder   . Meralgia paresthetica, unspecified laterality   . Paresthesia   . High risk medication use       Plan: Rash - appears to be reticular pattern such as levido reticularis but only on back, not diffuse.   Discussed possible causes.   Labs as below.  No other diffuse symptoms.    Meralgia paresthetica, paresthesias - discussed concerns, possible causes.   Labs as below.  Avoid leg crossing or prolonged pressure sitting or lying in one place such as on right side.   He may have injured the leg when he was found with overdose months ago.  Consider neurology consult.  Consider compounding cream topically for treatment pending labs  Carl Hughes was seen today for leg pain and consult.  Diagnoses and all orders for this visit:  Skin rash  Livedo reticularis -     Sedimentation rate -     CBC with Differential/Platelet -     ANA -     Cardiolipin antibodies, IgG, IgM, IgA  Family history of autoimmune disorder -     Sedimentation rate -     CBC with Differential/Platelet -     ANA -     Cardiolipin antibodies, IgG, IgM, IgA  Meralgia paresthetica, unspecified laterality -     Sedimentation rate -     CBC with Differential/Platelet -     ANA -     Cardiolipin antibodies, IgG, IgM, IgA  Paresthesia  High risk medication use    Follow up: pending labs

## 2020-09-09 LAB — CBC WITH DIFFERENTIAL/PLATELET
Basophils Absolute: 0.1 10*3/uL (ref 0.0–0.2)
Basos: 1 %
EOS (ABSOLUTE): 0.3 10*3/uL (ref 0.0–0.4)
Eos: 3 %
Hematocrit: 41.1 % (ref 37.5–51.0)
Hemoglobin: 14.1 g/dL (ref 13.0–17.7)
Immature Grans (Abs): 0 10*3/uL (ref 0.0–0.1)
Immature Granulocytes: 0 %
Lymphocytes Absolute: 3.7 10*3/uL — ABNORMAL HIGH (ref 0.7–3.1)
Lymphs: 37 %
MCH: 31.5 pg (ref 26.6–33.0)
MCHC: 34.3 g/dL (ref 31.5–35.7)
MCV: 92 fL (ref 79–97)
Monocytes Absolute: 0.8 10*3/uL (ref 0.1–0.9)
Monocytes: 8 %
Neutrophils Absolute: 5.2 10*3/uL (ref 1.4–7.0)
Neutrophils: 51 %
Platelets: 286 10*3/uL (ref 150–450)
RBC: 4.47 x10E6/uL (ref 4.14–5.80)
RDW: 12.9 % (ref 11.6–15.4)
WBC: 10.2 10*3/uL (ref 3.4–10.8)

## 2020-09-09 LAB — SEDIMENTATION RATE: Sed Rate: 10 mm/hr (ref 0–15)

## 2020-09-09 LAB — CARDIOLIPIN ANTIBODIES, IGG, IGM, IGA
Anticardiolipin IgA: <9 APL U/mL (ref 0–11)
Anticardiolipin IgG: <9 GPL U/mL (ref 0–14)
Anticardiolipin IgM: 11 MPL U/mL (ref 0–12)

## 2020-09-09 LAB — ANA: Anti Nuclear Antibody (ANA): NEGATIVE

## 2020-09-10 ENCOUNTER — Other Ambulatory Visit: Payer: Self-pay | Admitting: Medical

## 2020-09-10 MED ORDER — KETOPROFEN 10 % EX CREA: 1.0000 g | TOPICAL_CREAM | Freq: Two times a day (BID) | CUTANEOUS | 2 refills | Status: DC

## 2020-09-26 MED FILL — ALPRAZolam 0.5 MG TABS: 0.5 | 30 days supply | Qty: 30 | Fill #3

## 2020-09-26 MED FILL — DIAZEPAM 10 MG TABS: 10 | 30 days supply | Qty: 30 | Fill #3

## 2020-09-30 ENCOUNTER — Other Ambulatory Visit: Payer: Self-pay | Admitting: Medical

## 2020-09-30 MED ORDER — SILDENAFIL CITRATE 20 MG PO TABS
ORAL_TABLET | ORAL | 0 refills | Status: DC
Start: 2020-09-30 — End: 2020-10-30

## 2020-09-30 MED FILL — SILDENAFIL CITRATE 20 MG TA: 20 | 30 days supply | Qty: 30 | Fill #0

## 2020-10-06 MED FILL — QUETIAPINE FUMARATE 400 MG: 400 | 90 days supply | Qty: 90 | Fill #1

## 2020-10-09 MED FILL — ALPRAZolam ER 2 MG TB24: 2 | 30 days supply | Qty: 30 | Fill #2

## 2020-10-19 ENCOUNTER — Telehealth: Payer: 59 | Admitting: Physician Assistant

## 2020-10-19 ENCOUNTER — Encounter: Payer: Self-pay | Admitting: Physician Assistant

## 2020-10-19 DIAGNOSIS — L03818 Cellulitis of other sites: Secondary | ICD-10-CM | POA: Diagnosis not present

## 2020-10-19 MED ORDER — CEPHALEXIN 500 MG PO CAPS
500.0000 mg | ORAL_CAPSULE | Freq: Four times a day (QID) | ORAL | 0 refills | Status: DC
Start: 2020-10-19 — End: 2021-05-02

## 2020-10-19 NOTE — Progress Notes (Signed)
This appears to be a duplicate visit. Will cancel this Evisit.   NOTE: If you entered your credit card information for this eVisit, you will not be charged. You may see a "hold" on your card for the $35 but that hold will drop off and you will not have a charge processed.   If you are having a true medical emergency please call 911.      For an urgent face to face visit, [place] has five urgent care centers for your convenience:     [facility] [place] [facility] [place] Get Driving Directions [phone] [address] [place], [place] [address] . 10 am - 6pm Monday - Friday    [facility] [place] [facility]) Get Driving Directions [phone] [address] [place], [place] [address] . 10 am to 8 pm Monday-Friday . 12 pm to 8 pm [facility] Get Driving Directions [phone] [place], [place], [place] [address] . 8 am to 8 pm Monday-Friday . 9 am to 6 pm Saturday . 11 am to 6 pm Sunday     [facility] Get Driving Directions  [phone] [address], [place] [address] . 8 am to 8 pm Monday-Friday . 8 am to 4 pm [facility] [place] Get Driving Directions [phone] [address] [place],  [address] . 12 pm to 6 pm Monday-Friday      Your e-visit answers were reviewed by a board certified advanced clinical practitioner to complete your personal care plan.  Thank you for using e-Visits.    I spent 5-10 minutes on review and completion of this note- Lacy Duverney [facility]

## 2020-10-19 NOTE — Progress Notes (Signed)
E Visit for Cellulitis  We are sorry that you are not feeling well. Here is how we plan to help!  Based on what you shared with me it looks like you have cellulitis.  Cellulitis looks like areas of skin redness, swelling, and warmth; it develops as a result of bacteria entering under the skin. Little red spots and/or bleeding can be seen in skin, and tiny surface sacs containing fluid can occur. Fever can be present. Cellulitis is almost always on one side of a body, and the lower limbs are the most common site of involvement.   I have prescribed:  Keflex 500mg  take one by mouth four times a day for 5 days   If any of the areas of infection have formed an abscess, please have a face to face visit for further evaluation of your symptoms and possible lancing of the abscess.   HOME CARE:  . Take your medications as ordered and take all of them, even if the skin irritation appears to be healing.   GET HELP RIGHT AWAY IF:  . Symptoms that don't begin to go away within 48 hours. . Severe redness persists or worsens . If the area turns color, spreads or swells. . If it blisters and opens, develops yellow-brown crust or bleeds. . You develop a fever or chills. . If the pain increases or becomes unbearable.  . Are unable to keep fluids and food down.  MAKE SURE YOU    Understand these instructions.  Will watch your condition.  Will get help right away if you are not doing well or get worse.  Thank you for choosing an e-visit. Your e-visit answers were reviewed by a board certified advanced clinical practitioner to complete your personal care plan. Depending upon the condition, your plan could have included both over the counter or prescription medications. Please review your pharmacy choice. Make sure the pharmacy is open so you can pick up prescription now. If there is a problem, you may contact your provider through [employer] and have the prescription routed to another  pharmacy. Your safety is important to [place]. If you have drug allergies check your prescription carefully.  For the next 24 hours you can use MyChart to ask questions about today's visit, request a non-urgent call back, or ask for a work or school excuse. You will get an email in the next two days asking about your experience. I hope that your e-visit has been valuable and will speed your recovery. I spent 5-10 minutes on review and completion of this note- Lacy Duverney [facility]

## 2020-10-27 MED FILL — ALPRAZolam 0.5 MG TABS: 0.5 | 30 days supply | Qty: 30 | Fill #4

## 2020-10-27 MED FILL — DIAZEPAM 10 MG TABS: 10 | 30 days supply | Qty: 30 | Fill #4

## 2020-10-30 ENCOUNTER — Other Ambulatory Visit: Payer: Self-pay | Admitting: Medical

## 2020-10-30 MED FILL — SILDENAFIL CITRATE 20 MG TA: 20 | 30 days supply | Qty: 30 | Fill #0

## 2020-10-30 MED FILL — cloNIDine HCL 0.2 MG TABS: 0.2 | 90 days supply | Qty: 90 | Fill #1

## 2020-11-04 MED FILL — PRAMIPEXOLE 0.75 MG TABLET: 0.75 | 90 days supply | Qty: 90 | Fill #1

## 2020-11-06 MED FILL — ALPRAZolam ER 2 MG TB24: 2 | 30 days supply | Qty: 30 | Fill #3

## 2020-11-10 MED FILL — DULoxetine HCL 60 MG CPEP: 60 | 90 days supply | Qty: 180 | Fill #1

## 2020-11-14 ENCOUNTER — Emergency Department (HOSPITAL_COMMUNITY)
Admission: EM | Admit: 2020-11-14 | Discharge: 2020-11-14 | Disposition: A | Discharge status: Home / Self Care | Payer: 59 | Attending: Emergency Medicine | Admitting: Emergency Medicine

## 2020-11-14 ENCOUNTER — Other Ambulatory Visit: Payer: Self-pay

## 2020-11-14 DIAGNOSIS — S0993XA Unspecified injury of face, initial encounter: Secondary | ICD-10-CM | POA: Insufficient documentation

## 2020-11-14 DIAGNOSIS — Z5321 Procedure and treatment not carried out due to patient leaving prior to being seen by health care provider: Secondary | ICD-10-CM | POA: Insufficient documentation

## 2020-11-14 DIAGNOSIS — X58XXXA Exposure to other specified factors, initial encounter: Secondary | ICD-10-CM | POA: Diagnosis not present

## 2020-11-14 NOTE — ED Notes (Signed)
Pt did not answer for triage multiple times.

## 2020-11-17 ENCOUNTER — Ambulatory Visit (INDEPENDENT_AMBULATORY_CARE_PROVIDER_SITE_OTHER): Payer: 59 | Admitting: Medical

## 2020-11-17 ENCOUNTER — Encounter: Payer: Self-pay | Admitting: Medical

## 2020-11-17 ENCOUNTER — Other Ambulatory Visit: Payer: Self-pay

## 2020-11-17 ENCOUNTER — Ambulatory Visit
Admission: RE | Admit: 2020-11-17 | Discharge: 2020-11-17 | Disposition: A | Discharge status: Home / Self Care | Payer: 59 | Source: Ambulatory Visit | Attending: Medical | Admitting: Medical

## 2020-11-17 VITALS — BP 126/92 | HR 91 | Ht 75.0 in | Wt 197.0 lb

## 2020-11-17 DIAGNOSIS — Z202 Contact with and (suspected) exposure to infections with a predominantly sexual mode of transmission: Secondary | ICD-10-CM

## 2020-11-17 DIAGNOSIS — S0993XA Unspecified injury of face, initial encounter: Secondary | ICD-10-CM

## 2020-11-17 DIAGNOSIS — S0240FA Zygomatic fracture, left side, initial encounter for closed fracture: Secondary | ICD-10-CM | POA: Diagnosis not present

## 2020-11-17 DIAGNOSIS — Z113 Encounter for screening for infections with a predominantly sexual mode of transmission: Secondary | ICD-10-CM | POA: Diagnosis not present

## 2020-11-17 DIAGNOSIS — H21562 Pupillary abnormality, left eye: Secondary | ICD-10-CM

## 2020-11-17 DIAGNOSIS — R519 Headache, unspecified: Secondary | ICD-10-CM

## 2020-11-17 DIAGNOSIS — S060X0A Concussion without loss of consciousness, initial encounter: Secondary | ICD-10-CM | POA: Diagnosis not present

## 2020-11-17 DIAGNOSIS — S00202A Unspecified superficial injury of left eyelid and periocular area, initial encounter: Secondary | ICD-10-CM | POA: Diagnosis not present

## 2020-11-17 DIAGNOSIS — S0512XA Contusion of eyeball and orbital tissues, left eye, initial encounter: Secondary | ICD-10-CM

## 2020-11-17 HISTORY — DX: Unspecified injury of face, initial encounter: S09.93XA

## 2020-11-17 HISTORY — DX: Pupillary abnormality, left eye: H21.562

## 2020-11-17 HISTORY — DX: Headache, unspecified: R51.9

## 2020-11-17 HISTORY — DX: Concussion without loss of consciousness, initial encounter: S06.0X0A
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
## 2020-11-18 ENCOUNTER — Other Ambulatory Visit: Payer: Self-pay | Admitting: Medical

## 2020-11-18 ENCOUNTER — Ambulatory Visit (INDEPENDENT_AMBULATORY_CARE_PROVIDER_SITE_OTHER): Payer: 59 | Admitting: Otolaryngology

## 2020-11-18 ENCOUNTER — Encounter (INDEPENDENT_AMBULATORY_CARE_PROVIDER_SITE_OTHER): Payer: Self-pay | Admitting: Otolaryngology

## 2020-11-18 VITALS — Temp 97.7°F

## 2020-11-18 DIAGNOSIS — S0240FA Zygomatic fracture, left side, initial encounter for closed fracture: Secondary | ICD-10-CM

## 2020-11-18 LAB — HEPATITIS C ANTIBODY: Hep C Virus Ab: <0.1 s/co ratio (ref 0.0–0.9)

## 2020-11-18 LAB — GC/CHLAMYDIA PROBE AMP
Chlamydia trachomatis, NAA: NEGATIVE
Neisseria Gonorrhoeae by PCR: NEGATIVE

## 2020-11-18 LAB — HEPATITIS B SURFACE ANTIGEN: Hepatitis B Surface Ag: NEGATIVE

## 2020-11-18 LAB — HIV ANTIBODY (ROUTINE TESTING W REFLEX): HIV Screen 4th Generation wRfx: NONREACTIVE

## 2020-11-18 LAB — RPR: RPR Ser Ql: NONREACTIVE

## 2020-11-18 MED ORDER — OXYCODONE-ACETAMINOPHEN 5-325 MG PO TABS: 1.0000 | ORAL_TABLET | Freq: Four times a day (QID) | ORAL | 0 refills | Status: DC | PRN

## 2020-11-18 MED FILL — OXYCODONE-APAP 5-325MG: 5-325 | 3 days supply | Qty: 12 | Fill #0
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
## 2020-11-20 ENCOUNTER — Other Ambulatory Visit: Payer: Self-pay

## 2020-11-20 ENCOUNTER — Other Ambulatory Visit: Payer: Self-pay | Admitting: Medical

## 2020-11-20 LAB — HSV 2 ANTIBODY, IGG: HSV 2 IgG, Type Spec: <0.91 index (ref 0.00–0.90)

## 2020-11-20 LAB — SPECIMEN STATUS REPORT

## 2020-11-20 LAB — HSV 1 ANTIBODY, IGG: HSV 1 Glycoprotein G Ab, IgG: <0.91 index (ref 0.00–0.90)

## 2020-11-20 MED ORDER — OXYCODONE-ACETAMINOPHEN 5-325 MG PO TABS: 1.0000 | ORAL_TABLET | Freq: Four times a day (QID) | ORAL | 0 refills | Status: DC | PRN

## 2020-11-20 NOTE — Telephone Encounter (Signed)
Please advise KH 

## 2020-11-21 MED FILL — OXYCODONE-APAP 5-325MG: 5-325 | 3 days supply | Qty: 12 | Fill #0

## 2020-11-28 ENCOUNTER — Other Ambulatory Visit: Payer: Self-pay | Admitting: Medical

## 2020-11-28 MED ORDER — POLYMYXIN B-TRIMETHOPRIM 10000-0.1 UNIT/ML-% OP SOLN: 1.0000 [drp] | OPHTHALMIC | 0 refills | Status: DC

## 2020-11-28 MED FILL — DIAZEPAM 10 MG TABS: 10 | 30 days supply | Qty: 30 | Fill #5

## 2020-11-28 MED FILL — ALPRAZolam 0.5 MG TABS: 0.5 | 30 days supply | Qty: 30 | Fill #5

## 2020-12-03 DIAGNOSIS — M5136 Other intervertebral disc degeneration, lumbar region: Secondary | ICD-10-CM | POA: Diagnosis not present

## 2020-12-03 DIAGNOSIS — M47814 Spondylosis without myelopathy or radiculopathy, thoracic region: Secondary | ICD-10-CM | POA: Diagnosis not present

## 2020-12-03 DIAGNOSIS — M9901 Segmental and somatic dysfunction of cervical region: Secondary | ICD-10-CM | POA: Diagnosis not present

## 2020-12-03 DIAGNOSIS — M5032 Other cervical disc degeneration, mid-cervical region, unspecified level: Secondary | ICD-10-CM | POA: Diagnosis not present

## 2020-12-03 DIAGNOSIS — R519 Headache, unspecified: Secondary | ICD-10-CM | POA: Diagnosis not present

## 2020-12-03 DIAGNOSIS — M47812 Spondylosis without myelopathy or radiculopathy, cervical region: Secondary | ICD-10-CM | POA: Diagnosis not present

## 2020-12-03 DIAGNOSIS — M5386 Other specified dorsopathies, lumbar region: Secondary | ICD-10-CM | POA: Diagnosis not present

## 2020-12-03 DIAGNOSIS — M9902 Segmental and somatic dysfunction of thoracic region: Secondary | ICD-10-CM | POA: Diagnosis not present

## 2020-12-03 DIAGNOSIS — M5031 Other cervical disc degeneration,  high cervical region: Secondary | ICD-10-CM | POA: Diagnosis not present

## 2020-12-05 ENCOUNTER — Other Ambulatory Visit (HOSPITAL_COMMUNITY): Payer: Self-pay | Admitting: Psychiatry

## 2020-12-05 MED FILL — CYCLOBENZAPRINE HCL 10 MG T: 10 | 30 days supply | Qty: 30 | Fill #0

## 2020-12-08 ENCOUNTER — Other Ambulatory Visit (HOSPITAL_COMMUNITY): Payer: Self-pay | Admitting: Psychiatry

## 2020-12-08 MED FILL — ALPRAZolam ER 2 MG TB24: 2 | 30 days supply | Qty: 30 | Fill #0

## 2020-12-09 ENCOUNTER — Other Ambulatory Visit: Payer: Self-pay | Admitting: Medical

## 2020-12-09 ENCOUNTER — Telehealth: Payer: Self-pay | Admitting: Medical

## 2020-12-09 MED ORDER — SILVER SULFADIAZINE 1 % EX CREA
TOPICAL_CREAM | CUTANEOUS | 1 refills | Status: DC
Start: 2020-12-09 — End: 2020-12-09

## 2020-12-09 MED ORDER — CEPHALEXIN 500 MG PO CAPS: 500.0000 mg | ORAL_CAPSULE | Freq: Three times a day (TID) | ORAL | 0 refills | Status: DC

## 2020-12-09 MED FILL — CEPHALEXIN 500 MG CAPSULE: 500 | 10 days supply | Qty: 30 | Fill #0

## 2020-12-09 MED FILL — SSD 1% CREAM: 1 | 25 days supply | Qty: 50 | Fill #0

## 2020-12-09 NOTE — Telephone Encounter (Signed)
Let him and/or mother know that I sent silvadene cream topically and oral Keflex antibiotic to pharmacy

## 2020-12-09 NOTE — Telephone Encounter (Signed)
Mom informed.

## 2020-12-11 DIAGNOSIS — M9901 Segmental and somatic dysfunction of cervical region: Secondary | ICD-10-CM | POA: Diagnosis not present

## 2020-12-11 DIAGNOSIS — M47812 Spondylosis without myelopathy or radiculopathy, cervical region: Secondary | ICD-10-CM | POA: Diagnosis not present

## 2020-12-11 DIAGNOSIS — M47814 Spondylosis without myelopathy or radiculopathy, thoracic region: Secondary | ICD-10-CM | POA: Diagnosis not present

## 2020-12-11 DIAGNOSIS — M5386 Other specified dorsopathies, lumbar region: Secondary | ICD-10-CM | POA: Diagnosis not present

## 2020-12-11 DIAGNOSIS — M5136 Other intervertebral disc degeneration, lumbar region: Secondary | ICD-10-CM | POA: Diagnosis not present

## 2020-12-11 DIAGNOSIS — M5031 Other cervical disc degeneration,  high cervical region: Secondary | ICD-10-CM | POA: Diagnosis not present

## 2020-12-11 DIAGNOSIS — M5032 Other cervical disc degeneration, mid-cervical region, unspecified level: Secondary | ICD-10-CM | POA: Diagnosis not present

## 2020-12-11 DIAGNOSIS — M9902 Segmental and somatic dysfunction of thoracic region: Secondary | ICD-10-CM | POA: Diagnosis not present

## 2020-12-11 DIAGNOSIS — R519 Headache, unspecified: Secondary | ICD-10-CM | POA: Diagnosis not present

## 2020-12-22 ENCOUNTER — Other Ambulatory Visit (HOSPITAL_BASED_OUTPATIENT_CLINIC_OR_DEPARTMENT_OTHER): Payer: Self-pay

## 2020-12-29 ENCOUNTER — Other Ambulatory Visit: Payer: Self-pay | Admitting: Medical

## 2020-12-29 ENCOUNTER — Other Ambulatory Visit (HOSPITAL_COMMUNITY): Payer: Self-pay | Admitting: Psychiatry

## 2020-12-30 ENCOUNTER — Other Ambulatory Visit (HOSPITAL_COMMUNITY): Payer: Self-pay | Admitting: Psychiatry

## 2020-12-30 DIAGNOSIS — F3181 Bipolar II disorder: Secondary | ICD-10-CM | POA: Diagnosis not present

## 2020-12-30 DIAGNOSIS — F4312 Post-traumatic stress disorder, chronic: Secondary | ICD-10-CM | POA: Diagnosis not present

## 2020-12-30 MED FILL — SILDENAFIL CITRATE 20 MG TA: 20 | 30 days supply | Qty: 30 | Fill #0

## 2020-12-30 MED FILL — ALPRAZolam 0.5 MG TABS: 0.5 | 30 days supply | Qty: 30 | Fill #0

## 2020-12-30 MED FILL — CYCLOBENZAPRINE HCL 10 MG T: 10 | 90 days supply | Qty: 90 | Fill #0

## 2020-12-30 MED FILL — QUETIAPINE FUMARATE 400 MG: 400 | 90 days supply | Qty: 90 | Fill #0

## 2020-12-30 MED FILL — DIAZEPAM 10 MG TABS: 10 | 30 days supply | Qty: 30 | Fill #0

## 2021-01-02 ENCOUNTER — Other Ambulatory Visit: Payer: Self-pay | Admitting: Medical

## 2021-01-02 MED ORDER — TADALAFIL 20 MG PO TABS: ORAL_TABLET | ORAL | 0 refills | Status: DC

## 2021-01-02 MED FILL — CARISOPRODOL 350 MG TABS: 350 | 30 days supply | Qty: 30 | Fill #0

## 2021-01-05 MED FILL — ALPRAZolam ER 2 MG TB24: 2 | 34 days supply | Qty: 34 | Fill #0

## 2021-01-27 MED FILL — DIAZEPAM 10 MG TABS: 10 | 30 days supply | Qty: 30 | Fill #0

## 2021-01-27 MED FILL — ALPRAZolam 0.5 MG TABS: 0.5 | 30 days supply | Qty: 30 | Fill #0

## 2021-01-28 ENCOUNTER — Other Ambulatory Visit: Payer: Self-pay | Admitting: Medical

## 2021-01-28 MED ORDER — CLINDAMYCIN PHOS-BENZOYL PEROX 1-5 % EX GEL: CUTANEOUS | 0 refills | Status: AC

## 2021-01-29 MED FILL — PRAMIPEXOLE 0.75 MG TABLET: 0.75 | 90 days supply | Qty: 90 | Fill #0

## 2021-02-04 DIAGNOSIS — F331 Major depressive disorder, recurrent, moderate: Secondary | ICD-10-CM | POA: Diagnosis not present

## 2021-02-04 DIAGNOSIS — F1220 Cannabis dependence, uncomplicated: Secondary | ICD-10-CM | POA: Diagnosis not present

## 2021-02-05 ENCOUNTER — Other Ambulatory Visit: Payer: Self-pay | Admitting: Medical

## 2021-02-05 ENCOUNTER — Telehealth: Payer: Self-pay

## 2021-02-05 MED ORDER — TADALAFIL 20 MG PO TABS: ORAL_TABLET | ORAL | 0 refills | Status: DC

## 2021-02-05 MED FILL — DULoxetine HCL 60 MG CPEP: 60 | 90 days supply | Qty: 180 | Fill #0

## 2021-02-05 NOTE — Telephone Encounter (Signed)
Pt states he would like a refill on the Tadalafil, these work better than Viagra, you may have gotten [facility]ill request from [facility] but his insurance is not paying for this so he would like this sent to [facility] [phone] with Elita Quick he can get #30 20mg  for $12.37 verses $109 at [facility] for #6 pills

## 2021-02-06 NOTE — Telephone Encounter (Signed)
Refill done.  

## 2021-02-09 ENCOUNTER — Other Ambulatory Visit (HOSPITAL_COMMUNITY): Payer: Self-pay

## 2021-02-09 MED FILL — Alprazolam Tab ER 24HR 2 MG: ORAL | 34 days supply | Qty: 34 | Fill #0 | Status: AC

## 2021-02-09 MED FILL — Alprazolam Tab ER 24HR 2 MG: ORAL | 34 days supply | Qty: 34 | Fill #0 | Status: CN

## 2021-02-10 ENCOUNTER — Telehealth: Payer: Self-pay | Admitting: Medical

## 2021-02-10 NOTE — Telephone Encounter (Signed)
Either needs appt or he should call [facility] to get in for recheck on kidney stones.

## 2021-02-24 DIAGNOSIS — H5213 Myopia, bilateral: Secondary | ICD-10-CM | POA: Diagnosis not present

## 2021-02-27 ENCOUNTER — Other Ambulatory Visit (HOSPITAL_COMMUNITY): Payer: Self-pay

## 2021-02-27 MED FILL — Alprazolam Tab 0.5 MG: ORAL | 30 days supply | Qty: 30 | Fill #0 | Status: AC

## 2021-02-27 MED FILL — Diazepam Tab 10 MG: ORAL | 30 days supply | Qty: 30 | Fill #0 | Status: AC

## 2021-03-12 ENCOUNTER — Other Ambulatory Visit (HOSPITAL_COMMUNITY): Payer: Self-pay

## 2021-03-16 ENCOUNTER — Other Ambulatory Visit (HOSPITAL_COMMUNITY): Payer: Self-pay

## 2021-03-16 MED FILL — Alprazolam Tab ER 24HR 2 MG: ORAL | 34 days supply | Qty: 34 | Fill #1 | Status: AC

## 2021-03-16 MED FILL — Alprazolam Tab ER 24HR 2 MG: ORAL | 34 days supply | Qty: 34 | Fill #1 | Status: CN

## 2021-03-30 ENCOUNTER — Other Ambulatory Visit (HOSPITAL_COMMUNITY): Payer: Self-pay

## 2021-03-30 MED FILL — Alprazolam Tab 0.5 MG: ORAL | 30 days supply | Qty: 30 | Fill #1 | Status: AC

## 2021-03-30 MED FILL — Diazepam Tab 10 MG: ORAL | 30 days supply | Qty: 30 | Fill #1 | Status: AC

## 2021-04-02 ENCOUNTER — Other Ambulatory Visit (HOSPITAL_COMMUNITY): Payer: Self-pay

## 2021-04-02 MED FILL — Quetiapine Fumarate Tab 400 MG: ORAL | 90 days supply | Qty: 90 | Fill #0 | Status: AC

## 2021-04-07 ENCOUNTER — Other Ambulatory Visit (HOSPITAL_COMMUNITY): Payer: Self-pay

## 2021-04-07 DIAGNOSIS — F3181 Bipolar II disorder: Secondary | ICD-10-CM | POA: Diagnosis not present

## 2021-04-07 MED ORDER — OXCARBAZEPINE 300 MG PO TABS
300.0000 mg | ORAL_TABLET | Freq: Every day | ORAL | 0 refills | Status: DC
  Filled 2021-04-07: qty 90, 90d supply, fill #0

## 2021-04-08 ENCOUNTER — Other Ambulatory Visit (HOSPITAL_COMMUNITY): Payer: Self-pay

## 2021-04-13 ENCOUNTER — Telehealth: Payer: Self-pay | Admitting: Neurology

## 2021-04-13 NOTE — Telephone Encounter (Signed)
I can't comment until I see and examine him.
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
## 2021-04-14 ENCOUNTER — Other Ambulatory Visit (HOSPITAL_COMMUNITY): Payer: Self-pay

## 2021-04-14 NOTE — Telephone Encounter (Signed)
Called and spoke to patients mother and informed her that Dr. Tomi Likens is unable to comment on patient until he sees and examines him.   Patients mother voiced understanding and is aware that there are no sooner appts and that if something opens up for patient on the waitlist we will call. Patients mother had no further questions or concerns.

## 2021-04-17 ENCOUNTER — Other Ambulatory Visit (HOSPITAL_COMMUNITY): Payer: Self-pay

## 2021-04-17 MED FILL — Alprazolam Tab ER 24HR 2 MG: ORAL | 34 days supply | Qty: 34 | Fill #2 | Status: AC

## 2021-04-27 ENCOUNTER — Other Ambulatory Visit (HOSPITAL_COMMUNITY): Payer: Self-pay

## 2021-04-27 MED FILL — Alprazolam Tab 0.5 MG: ORAL | 30 days supply | Qty: 30 | Fill #2 | Status: AC

## 2021-04-27 MED FILL — Diazepam Tab 10 MG: ORAL | 30 days supply | Qty: 30 | Fill #2 | Status: AC

## 2021-04-28 ENCOUNTER — Other Ambulatory Visit: Payer: Self-pay | Admitting: Medical

## 2021-04-28 ENCOUNTER — Telehealth: Payer: Self-pay

## 2021-04-28 ENCOUNTER — Other Ambulatory Visit (HOSPITAL_COMMUNITY): Payer: Self-pay

## 2021-04-28 MED ORDER — POLYMYXIN B-TRIMETHOPRIM 10000-0.1 UNIT/ML-% OP SOLN: 1.0000 [drp] | OPHTHALMIC | 0 refills | Status: DC

## 2021-04-28 MED ORDER — POLYMYXIN B-TRIMETHOPRIM 10000-0.1 UNIT/ML-% OP SOLN
1.0000 [drp] | OPHTHALMIC | 0 refills | Status: DC
  Filled 2021-04-28: qty 10, 34d supply, fill #0

## 2021-04-28 NOTE — Telephone Encounter (Signed)
Eye drop was called in to [facility].

## 2021-04-28 NOTE — Telephone Encounter (Signed)
Pt has had another stye on his lower lid for about a week, now has gotten one on top lid.  Has been using warm compresses.  Would like eye drops sent in like you did earlier this year to [facility] out patient pharmacy please

## 2021-04-29 ENCOUNTER — Other Ambulatory Visit (HOSPITAL_COMMUNITY): Payer: Self-pay

## 2021-04-29 MED FILL — Clonidine HCl Tab 0.2 MG: ORAL | 90 days supply | Qty: 90 | Fill #0 | Status: AC

## 2021-04-29 MED FILL — Pramipexole Dihydrochloride Tab 0.75 MG: ORAL | 90 days supply | Qty: 90 | Fill #0 | Status: AC

## 2021-04-30 ENCOUNTER — Other Ambulatory Visit (HOSPITAL_COMMUNITY): Payer: Self-pay

## 2021-05-01 NOTE — Telephone Encounter (Signed)
done

## 2021-05-02 ENCOUNTER — Telehealth: Payer: 59 | Admitting: Orthopedic Surgery

## 2021-05-02 DIAGNOSIS — L03221 Cellulitis of neck: Secondary | ICD-10-CM | POA: Diagnosis not present

## 2021-05-02 MED ORDER — CEPHALEXIN 500 MG PO CAPS: 500.0000 mg | ORAL_CAPSULE | Freq: Four times a day (QID) | ORAL | 0 refills | Status: AC

## 2021-05-02 NOTE — Progress Notes (Signed)
E Visit for Cellulitis  We are sorry that you are not feeling well. Here is how we plan to help!  Based on what you shared with me it looks like you have cellulitis.  Cellulitis looks like areas of skin redness, swelling, and warmth; it develops as a result of bacteria entering under the skin. Little red spots and/or bleeding can be seen in skin, and tiny surface sacs containing fluid can occur. Fever can be present. Cellulitis is almost always on one side of a body, and the lower limbs are the most common site of involvement.   I have prescribed:  Keflex 500mg  take one by mouth four times a day for 5 days  HOME CARE:  Take your medications as ordered and take all of them, even if the skin irritation appears to be healing.   GET HELP RIGHT AWAY IF:  Symptoms that don't begin to go away within 48 hours. Severe redness persists or worsens If the area turns color, spreads or swells. If it blisters and opens, develops yellow-brown crust or bleeds. You develop a fever or chills. If the pain increases or becomes unbearable.  Are unable to keep fluids and food down.  MAKE SURE YOU   Understand these instructions. Will watch your condition. Will get help right away if you are not doing well or get worse.  Thank you for choosing an e-visit.  Your e-visit answers were reviewed by a board certified advanced clinical practitioner to complete your personal care plan. Depending upon the condition, your plan could have included both over the counter or prescription medications.  Please review your pharmacy choice. Make sure the pharmacy is open so you can pick up prescription now. If there is a problem, you may contact your provider through [employer] and have the prescription routed to another pharmacy.  Your safety is important to [place]. If you have drug allergies check your prescription carefully.   For the next 24 hours you can use MyChart to ask questions about today's visit, request a  non-urgent call back, or ask for a work or school excuse. You will get an email in the next two days asking about your experience. I hope that your e-visit has been valuable and will speed your recovery.   Greater than 5 minutes, yet less than 10 minutes of time have been spent researching, coordinating and implementing care for this patient today.

## 2021-05-12 ENCOUNTER — Other Ambulatory Visit (HOSPITAL_COMMUNITY): Payer: Self-pay

## 2021-05-12 MED FILL — Duloxetine HCl Enteric Coated Pellets Cap 60 MG (Base Eq): ORAL | 90 days supply | Qty: 180 | Fill #0 | Status: AC

## 2021-05-18 ENCOUNTER — Other Ambulatory Visit (HOSPITAL_COMMUNITY): Payer: Self-pay

## 2021-05-18 MED FILL — Alprazolam Tab ER 24HR 2 MG: ORAL | 34 days supply | Qty: 34 | Fill #3 | Status: CN

## 2021-05-19 ENCOUNTER — Other Ambulatory Visit (HOSPITAL_COMMUNITY): Payer: Self-pay

## 2021-05-19 MED FILL — Alprazolam Tab ER 24HR 2 MG: ORAL | 34 days supply | Qty: 34 | Fill #3 | Status: AC

## 2021-05-21 ENCOUNTER — Other Ambulatory Visit (HOSPITAL_COMMUNITY): Payer: Self-pay
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
## 2021-05-23 ENCOUNTER — Telehealth: Payer: 59 | Admitting: Physician Assistant

## 2021-05-23 ENCOUNTER — Encounter: Payer: Self-pay | Admitting: Physician Assistant

## 2021-05-23 DIAGNOSIS — L237 Allergic contact dermatitis due to plants, except food: Secondary | ICD-10-CM

## 2021-05-23 MED ORDER — HYDROXYZINE PAMOATE 25 MG PO CAPS: 25.0000 mg | ORAL_CAPSULE | Freq: Every evening | ORAL | 0 refills | Status: DC | PRN

## 2021-05-23 MED ORDER — PREDNISONE 10 MG PO TABS: ORAL_TABLET | ORAL | 0 refills | Status: DC

## 2021-05-23 NOTE — Progress Notes (Signed)
E Visit for Rash  We are sorry that you are not feeling well. Here is how we plan to help!  Based on what you shared with me it looks like you have contact dermatitis.  Contact dermatitis is a skin rash caused by something that touches the skin and causes irritation or inflammation, in your case, poison oak.  Your skin may be red, swollen, dry, cracked, and itch.  The rash should go away in a few days but can last a few weeks.  If you get a rash, it's important to figure out what caused it so the irritant can be avoided in the future.    Prednisone 10 mg daily for 6 days (see taper instructions below)  Directions for 6 day taper: Day 1: 2 tablets before breakfast, 1 after both lunch & dinner and 2 at bedtime Day 2: 1 tab before breakfast, 1 after both lunch & dinner and 2 at bedtime Day 3: 1 tab at each meal & 1 at bedtime Day 4: 1 tab at breakfast, 1 at lunch, 1 at bedtime Day 5: 1 tab at breakfast & 1 tab at bedtime Day 6: 1 tab at breakfast  I have also prescribed vistaryl, an anti itch to take at night to help you fall asleep. It can make you sleepy, avoid taking it and driving or operating machinery.     HOME CARE:  Take cool showers and avoid direct sunlight. Apply cool compress or wet dressings. Take a bath in an oatmeal bath.  Sprinkle content of one Aveeno packet under running faucet with comfortably warm water.  Bathe for 15-20 minutes, 1-2 times daily.  Pat dry with a towel. Do not rub the rash. Use hydrocortisone cream. Take an antihistamine like Benadryl for widespread rashes that itch.  The adult dose of Benadryl is 25-50 mg by mouth 4 times daily. Caution:  This type of medication may cause sleepiness.  Do not drink alcohol, drive, or operate dangerous machinery while taking antihistamines.  Do not take these medications if you have prostate enlargement.  Read package instructions thoroughly on all medications that you take.  GET HELP RIGHT AWAY IF:  Symptoms don't go  away after treatment. Severe itching that persists. If you rash spreads or swells. If you rash begins to smell. If it blisters and opens or develops a yellow-brown crust. You develop a fever. You have a sore throat. You become short of breath.  MAKE SURE YOU:  Understand these instructions. Will watch your condition. Will get help right away if you are not doing well or get worse.  Thank you for choosing an e-visit.  Your e-visit answers were reviewed by a board certified advanced clinical practitioner to complete your personal care plan. Depending upon the condition, your plan could have included both over the counter or prescription medications.  Please review your pharmacy choice. Make sure the pharmacy is open so you can pick up prescription now. If there is a problem, you may contact your provider through [employer] and have the prescription routed to another pharmacy.  Your safety is important to [place]. If you have drug allergies check your prescription carefully.   For the next 24 hours you can use MyChart to ask questions about today's visit, request a non-urgent call back, or ask for a work or school excuse. You will get an email in the next two days asking about your experience. I hope that your e-visit has been valuable and will speed your recovery. I  spent 5-10 minutes on review and completion of this note- [facility]

## 2021-05-25 ENCOUNTER — Encounter: Payer: Self-pay | Admitting: Neurology

## 2021-05-25 ENCOUNTER — Ambulatory Visit (INDEPENDENT_AMBULATORY_CARE_PROVIDER_SITE_OTHER): Payer: 59 | Admitting: Neurology

## 2021-05-25 ENCOUNTER — Other Ambulatory Visit: Payer: Self-pay

## 2021-05-25 VITALS — BP 138/101 | HR 74 | Ht 75.0 in | Wt 206.0 lb

## 2021-05-25 DIAGNOSIS — F0781 Postconcussional syndrome: Secondary | ICD-10-CM

## 2021-05-25 DIAGNOSIS — R410 Disorientation, unspecified: Secondary | ICD-10-CM

## 2021-05-25 DIAGNOSIS — F3181 Bipolar II disorder: Secondary | ICD-10-CM

## 2021-05-25 DIAGNOSIS — R4586 Emotional lability: Secondary | ICD-10-CM | POA: Diagnosis not present

## 2021-05-25 NOTE — Patient Instructions (Signed)
Will get MRI of brain   Will refer you for neurocognitive evaluation Follow up after testing.

## 2021-05-27 ENCOUNTER — Other Ambulatory Visit: Payer: Self-pay

## 2021-05-27 MED FILL — Alprazolam Tab 0.5 MG: ORAL | 30 days supply | Qty: 30 | Fill #3 | Status: AC

## 2021-05-28 ENCOUNTER — Other Ambulatory Visit (HOSPITAL_COMMUNITY): Payer: Self-pay

## 2021-05-28 ENCOUNTER — Telehealth: Payer: Self-pay

## 2021-05-28 MED ORDER — LIDOCAINE 5 % EX PTCH
1.0000 | MEDICATED_PATCH | CUTANEOUS | 0 refills | Status: AC
  Filled 2021-05-29: qty 30, 30d supply, fill #0

## 2021-05-28 NOTE — Telephone Encounter (Signed)
P.A. LIDOCAINE PATCHES

## 2021-05-29 ENCOUNTER — Other Ambulatory Visit (HOSPITAL_COMMUNITY): Payer: Self-pay

## 2021-05-30 ENCOUNTER — Inpatient Hospital Stay: Admission: RE | Admit: 2021-05-30 | Payer: 59 | Source: Ambulatory Visit

## 2021-05-30 MED FILL — Diazepam Tab 10 MG: ORAL | 30 days supply | Qty: 30 | Fill #3 | Status: AC

## 2021-06-01 ENCOUNTER — Other Ambulatory Visit (HOSPITAL_COMMUNITY): Payer: Self-pay

## 2021-06-01 MED ORDER — CARESTART COVID-19 HOME TEST VI KIT
PACK | 0 refills | Status: DC
  Filled 2021-06-01: qty 4, 4d supply, fill #0

## 2021-06-01 MED FILL — Carisoprodol Tab 350 MG: ORAL | 60 days supply | Qty: 60 | Fill #0 | Status: AC

## 2021-06-02 NOTE — Telephone Encounter (Signed)
PA approved.

## 2021-06-05 ENCOUNTER — Other Ambulatory Visit: Payer: 59

## 2021-06-05 ENCOUNTER — Other Ambulatory Visit (HOSPITAL_COMMUNITY): Payer: Self-pay

## 2021-06-07 ENCOUNTER — Ambulatory Visit
Admission: RE | Admit: 2021-06-07 | Discharge: 2021-06-07 | Disposition: A | Discharge status: Home / Self Care | Payer: 59 | Source: Ambulatory Visit | Attending: Neurology | Admitting: Neurology

## 2021-06-07 DIAGNOSIS — R4586 Emotional lability: Secondary | ICD-10-CM

## 2021-06-07 DIAGNOSIS — R410 Disorientation, unspecified: Secondary | ICD-10-CM

## 2021-06-07 DIAGNOSIS — F0781 Postconcussional syndrome: Secondary | ICD-10-CM | POA: Diagnosis not present

## 2021-06-07 DIAGNOSIS — J341 Cyst and mucocele of nose and nasal sinus: Secondary | ICD-10-CM | POA: Diagnosis not present

## 2021-06-16 MED FILL — Alprazolam Tab ER 24HR 2 MG: ORAL | 34 days supply | Qty: 34 | Fill #4 | Status: CN

## 2021-06-17 ENCOUNTER — Other Ambulatory Visit: Payer: Self-pay

## 2021-06-17 ENCOUNTER — Other Ambulatory Visit (HOSPITAL_COMMUNITY): Payer: Self-pay

## 2021-06-18 ENCOUNTER — Other Ambulatory Visit (HOSPITAL_COMMUNITY): Payer: Self-pay

## 2021-06-18 MED ORDER — ALPRAZOLAM ER 2 MG PO TB24
2.0000 mg | ORAL_TABLET | Freq: Every day | ORAL | 0 refills | Status: DC
  Filled 2021-06-18 – 2021-06-19 (×2): qty 30, 30d supply, fill #0

## 2021-06-19 ENCOUNTER — Other Ambulatory Visit (HOSPITAL_COMMUNITY): Payer: Self-pay

## 2021-06-19 ENCOUNTER — Encounter: Payer: Self-pay | Admitting: Internal Medicine

## 2021-06-29 ENCOUNTER — Other Ambulatory Visit (HOSPITAL_COMMUNITY): Payer: Self-pay

## 2021-06-29 ENCOUNTER — Other Ambulatory Visit: Payer: Self-pay

## 2021-06-29 MED ORDER — DIAZEPAM 10 MG PO TABS
10.0000 mg | ORAL_TABLET | Freq: Every evening | ORAL | 0 refills | Status: DC | PRN
  Filled 2021-06-29: qty 30, 30d supply, fill #0

## 2021-06-29 MED ORDER — ALPRAZOLAM 0.5 MG PO TABS
0.5000 mg | ORAL_TABLET | Freq: Every day | ORAL | 0 refills | Status: DC | PRN
  Filled 2021-06-29: qty 30, 30d supply, fill #0

## 2021-07-01 ENCOUNTER — Other Ambulatory Visit (HOSPITAL_COMMUNITY): Payer: Self-pay

## 2021-07-01 DIAGNOSIS — F3181 Bipolar II disorder: Secondary | ICD-10-CM | POA: Diagnosis not present

## 2021-07-01 DIAGNOSIS — F4312 Post-traumatic stress disorder, chronic: Secondary | ICD-10-CM | POA: Diagnosis not present

## 2021-07-01 DIAGNOSIS — Z79891 Long term (current) use of opiate analgesic: Secondary | ICD-10-CM | POA: Diagnosis not present

## 2021-07-01 MED ORDER — ALPRAZOLAM ER 2 MG PO TB24
2.0000 mg | ORAL_TABLET | Freq: Every day | ORAL | 0 refills | Status: DC
  Filled 2021-07-17: qty 30, 30d supply, fill #0
  Filled 2021-08-14: qty 30, 30d supply, fill #1
  Filled 2021-09-15: qty 30, 30d supply, fill #2

## 2021-07-01 MED ORDER — CARISOPRODOL 350 MG PO TABS: 350.0000 mg | ORAL_TABLET | Freq: Every day | ORAL | 0 refills | Status: DC | PRN

## 2021-07-01 MED ORDER — QUETIAPINE FUMARATE 400 MG PO TABS
400.0000 mg | ORAL_TABLET | Freq: Every day | ORAL | 0 refills | Status: DC
  Filled 2021-07-01 – 2021-07-06 (×2): qty 90, 90d supply, fill #0

## 2021-07-01 MED ORDER — CYCLOBENZAPRINE HCL 10 MG PO TABS
10.0000 mg | ORAL_TABLET | Freq: Every evening | ORAL | 0 refills | Status: DC
  Filled 2021-07-01: qty 90, 90d supply, fill #0

## 2021-07-01 MED ORDER — OXCARBAZEPINE 300 MG PO TABS
ORAL_TABLET | ORAL | 0 refills | Status: DC
  Filled 2021-07-01: qty 90, 90d supply, fill #0

## 2021-07-01 MED ORDER — ALPRAZOLAM 0.5 MG PO TABS
0.5000 mg | ORAL_TABLET | Freq: Every day | ORAL | 0 refills | Status: DC | PRN
  Filled 2021-08-07 (×2): qty 30, 30d supply, fill #0
  Filled 2021-09-08: qty 30, 30d supply, fill #1
  Filled 2021-12-07: qty 30, 30d supply, fill #2

## 2021-07-01 MED ORDER — DULOXETINE HCL 60 MG PO CPEP
120.0000 mg | ORAL_CAPSULE | Freq: Every morning | ORAL | 0 refills | Status: DC
  Filled 2021-07-06 – 2021-08-11 (×2): qty 180, 90d supply, fill #0

## 2021-07-01 MED ORDER — DIAZEPAM 10 MG PO TABS
10.0000 mg | ORAL_TABLET | Freq: Every evening | ORAL | 0 refills | Status: DC | PRN
  Filled 2021-08-07: qty 30, 30d supply, fill #0
  Filled 2021-09-08: qty 30, 30d supply, fill #1
  Filled 2021-12-07: qty 30, 30d supply, fill #2

## 2021-07-01 MED ORDER — PRAMIPEXOLE DIHYDROCHLORIDE 0.75 MG PO TABS
0.7500 mg | ORAL_TABLET | Freq: Every day | ORAL | 0 refills | Status: DC
  Filled 2021-07-01 – 2021-08-26 (×3): qty 90, 90d supply, fill #0

## 2021-07-02 ENCOUNTER — Other Ambulatory Visit (HOSPITAL_COMMUNITY): Payer: Self-pay

## 2021-07-06 ENCOUNTER — Other Ambulatory Visit (HOSPITAL_COMMUNITY): Payer: Self-pay

## 2021-07-17 ENCOUNTER — Other Ambulatory Visit (HOSPITAL_COMMUNITY): Payer: Self-pay

## 2021-07-22 ENCOUNTER — Telehealth: Payer: 59 | Admitting: Physician Assistant

## 2021-07-22 DIAGNOSIS — L739 Follicular disorder, unspecified: Secondary | ICD-10-CM

## 2021-07-23 ENCOUNTER — Telehealth: Payer: 59 | Admitting: Emergency Medicine

## 2021-07-23 DIAGNOSIS — L731 Pseudofolliculitis barbae: Secondary | ICD-10-CM

## 2021-07-23 MED ORDER — DOXYCYCLINE HYCLATE 100 MG PO TABS
100.0000 mg | ORAL_TABLET | Freq: Two times a day (BID) | ORAL | 0 refills | Status: DC
Start: 2021-07-23 — End: 2022-06-21

## 2021-07-23 NOTE — Progress Notes (Signed)
  E Visit for Folliculitis   We are sorry you are not feeling well.  Here is how we plan to help!  Based on what you have shared with me it looks like you have folliculitis.  Folliculitis refers to inflammation of the superficial or deep portio of the hair follicle.  It can be infectious or non-infectious. Various bacteria, fungi, viruses, and parasites can cause infectious folliculis  Based upon what you have shared with me it looks like you have a bacterial follicultits.  Folliculitis is inflammation of the hair follicles that can be caused by a superficial infection of the skin and is treated with an antibiotic. I have prescribed: and Doxycycline 100 mg twice per day for 7 days   HOME CARE: Apply a warm, moist washcloth or compress using a saltwater solution (1 teaspoon of table salt to 2 cups water) Apply over the counter antibiotic cream, gel or wash  Apply soothing lotions such as oatmeal lotion or over the counter hydrocortisone cream Clean the affected skin twice daily with antibacterial soap. Use clean washcloth and towel each time and do not share with anyone.  Wash these items and clothes that have touched the area with hot soapy water. Protect the skin. If possible avoid shaving.  If you must shave, try an Copy.  When done, rinse skin with warm water and apply moisturizer.  GET HELP RIGHT AWAY IF: You have extensive skin involvement or the symptoms return after treatment Symptoms don't go away after treatment. Severe itching that persists. If you rash spreads or swells. If you rash begins to smell. If it blisters and opens or develops a yellow-brown crust. You develop a fever. You have a sore throat. You become short of breath.  MAKE SURE YOU:  Understand these instructions. Will watch your condition. Will get help right away if you are not doing well or get worse.  Thank you for choosing an e-visit.  Your e-visit answers were reviewed by a board certified  advanced clinical practitioner to complete your personal care plan. Depending upon the condition, your plan could have included both over the counter or prescription medications.  Please review your pharmacy choice. Make sure the pharmacy is open so you can pick up prescription now. If there is a problem, you may contact your provider through [employer] and have the prescription routed to another pharmacy.  Your safety is important to [place]. If you have drug allergies check your prescription carefully.   For the next 24 hours you can use MyChart to ask questions about today's visit, request a non-urgent call back, or ask for a work or school excuse. You will get an email in the next two days asking about your experience. I hope that your e-visit has been valuable and will speed your recovery.

## 2021-07-23 NOTE — Progress Notes (Signed)
I have spent 5 minutes in review of e-visit questionnaire, review and updating patient chart, medical decision making and response to patient.   Staphanie Harbison Cody Tevis Dunavan, PA-C    

## 2021-07-23 NOTE — Progress Notes (Signed)
We are sorry that you are experiencing this issue.  Here is how we plan to help!  Based on what you shared with me it looks like you have uncomplicatedacne.  Acne is a disorder of the hair follicles and oil glands (sebaceous glands). The sebaceous glands secrete oils to keep the skin moist.  When the glands get clogged, it can lead to pimples or cysts.  These cysts may become infected and leave scars. Acne is very common and normally occurs at puberty.  Acne is also inherited.  Your personal care plan consists of the following recommendations:  I recommend that you use a daily cleanser  You might try an over the counter cleanser that has benzoyl peroxide.  I recommend that you start with a product that has 2.5% benzoyl peroxide.  Stronger concentrations have not been shown to be more effective.   I have also prescribed one of the following additional therapies:  Doxycycline an oral antibiotic 100 mg twice a day  You should probably be seen by your PCP if this doesn't solve the problem to get the ingrown hairs removed.  If excessive dryness or peeling occurs, reduce dose frequency or concentration of the topical scrubs.  If excessive stinging or burning occurs, remove the topical gel with mild soap and water and resume at a lower dose the next day.  Remember oral antibiotics and topical acne treatments may increase your sensitivity to the sun!  HOME CARE: Do not squeeze pimples because that can often lead to infections, worse acne, and scars. Use a moisturizer that contains retinoid or fruit acids that may inhibit the development of new acne lesions. Although there is not a clear link that foods can cause acne, doctors do believe that too many sweets predispose you to skin problems.  GET HELP RIGHT AWAY IF: If your acne gets worse or is not better within 10 days. If you become depressed. If you become pregnant, discontinue medications and call your OB/GYN.  MAKE SURE YOU: Understand  these instructions. Will watch your condition. Will get help right away if you are not doing well or get worse.  Thank you for choosing an e-visit.  Your e-visit answers were reviewed by a board certified advanced clinical practitioner to complete your personal care plan. Depending upon the condition, your plan could have included both over the counter or prescription medications.  Please review your pharmacy choice. Make sure the pharmacy is open so you can pick up prescription now. If there is a problem, you may contact your provider through [employer] and have the prescription routed to another pharmacy.  Your safety is important to [place]. If you have drug allergies check your prescription carefully.   For the next 24 hours you can use MyChart to ask questions about today's visit, request a non-urgent call back, or ask for a work or school excuse. You will get an email in the next two days asking about your experience. I hope that your e-visit has been valuable and will speed your recovery.  Approximately 5 minutes was used in reviewing the patient's chart, questionnaire, prescribing medications, and documentation.

## 2021-07-24 ENCOUNTER — Other Ambulatory Visit (HOSPITAL_COMMUNITY): Payer: Self-pay

## 2021-08-01 ENCOUNTER — Other Ambulatory Visit: Payer: Self-pay | Admitting: Medical

## 2021-08-01 MED ORDER — PRAMIPEXOLE DIHYDROCHLORIDE 0.75 MG PO TABS: 0.7500 mg | ORAL_TABLET | Freq: Every day | ORAL | 0 refills | Status: DC

## 2021-08-03 ENCOUNTER — Other Ambulatory Visit (HOSPITAL_COMMUNITY): Payer: Self-pay

## 2021-08-07 ENCOUNTER — Other Ambulatory Visit (HOSPITAL_COMMUNITY): Payer: Self-pay

## 2021-08-11 ENCOUNTER — Other Ambulatory Visit (HOSPITAL_COMMUNITY): Payer: Self-pay

## 2021-08-12 ENCOUNTER — Other Ambulatory Visit (HOSPITAL_COMMUNITY): Payer: Self-pay

## 2021-08-14 ENCOUNTER — Other Ambulatory Visit (HOSPITAL_COMMUNITY): Payer: Self-pay

## 2021-08-18 ENCOUNTER — Encounter: Payer: 59 | Admitting: Psychology

## 2021-08-21 ENCOUNTER — Other Ambulatory Visit (HOSPITAL_COMMUNITY): Payer: Self-pay

## 2021-08-21 DIAGNOSIS — L731 Pseudofolliculitis barbae: Secondary | ICD-10-CM | POA: Diagnosis not present

## 2021-08-21 MED ORDER — CLINDAMYCIN PHOS-BENZOYL PEROX 1-5 % EX GEL
CUTANEOUS | 1 refills | Status: DC
  Filled 2021-08-21: qty 50, 30d supply, fill #0

## 2021-08-21 MED ORDER — TRETINOIN 0.025 % EX CREA
TOPICAL_CREAM | CUTANEOUS | 1 refills | Status: DC
  Filled 2021-08-21 – 2021-08-25 (×2): qty 45, 30d supply, fill #0

## 2021-08-21 MED ORDER — CLINDAMYCIN PHOSPHATE 1 % EX SWAB
CUTANEOUS | 2 refills | Status: DC
  Filled 2021-08-21: qty 60, 60d supply, fill #0

## 2021-08-25 ENCOUNTER — Other Ambulatory Visit (HOSPITAL_COMMUNITY): Payer: Self-pay

## 2021-08-26 ENCOUNTER — Other Ambulatory Visit: Payer: Self-pay

## 2021-08-26 ENCOUNTER — Encounter: Payer: 59 | Admitting: Psychology

## 2021-08-26 ENCOUNTER — Other Ambulatory Visit (HOSPITAL_COMMUNITY): Payer: Self-pay

## 2021-08-27 ENCOUNTER — Other Ambulatory Visit (HOSPITAL_COMMUNITY): Payer: Self-pay

## 2021-08-27 MED ORDER — CLONIDINE HCL 0.2 MG PO TABS
0.2000 mg | ORAL_TABLET | Freq: Every day | ORAL | 0 refills | Status: DC
  Filled 2021-08-27: qty 90, 90d supply, fill #0

## 2021-09-08 ENCOUNTER — Other Ambulatory Visit (HOSPITAL_COMMUNITY): Payer: Self-pay

## 2021-09-15 ENCOUNTER — Other Ambulatory Visit (HOSPITAL_COMMUNITY): Payer: Self-pay

## 2021-09-17 ENCOUNTER — Other Ambulatory Visit (HOSPITAL_COMMUNITY): Payer: Self-pay

## 2021-09-18 ENCOUNTER — Encounter: Payer: 59 | Admitting: Medical

## 2021-09-21 ENCOUNTER — Other Ambulatory Visit (HOSPITAL_COMMUNITY): Payer: Self-pay

## 2021-09-21 MED ORDER — ALPRAZOLAM ER 2 MG PO TB24
2.0000 mg | ORAL_TABLET | Freq: Every evening | ORAL | 0 refills | Status: DC
  Filled 2021-09-21: qty 90, 90d supply, fill #0
  Filled 2021-10-13: qty 30, 30d supply, fill #0
  Filled 2021-12-03: qty 30, 30d supply, fill #1

## 2021-09-24 DIAGNOSIS — R208 Other disturbances of skin sensation: Secondary | ICD-10-CM | POA: Diagnosis not present

## 2021-09-24 DIAGNOSIS — L723 Sebaceous cyst: Secondary | ICD-10-CM | POA: Diagnosis not present

## 2021-09-24 DIAGNOSIS — L538 Other specified erythematous conditions: Secondary | ICD-10-CM | POA: Diagnosis not present

## 2021-09-24 DIAGNOSIS — D485 Neoplasm of uncertain behavior of skin: Secondary | ICD-10-CM | POA: Diagnosis not present

## 2021-09-30 ENCOUNTER — Other Ambulatory Visit (HOSPITAL_COMMUNITY): Payer: Self-pay

## 2021-09-30 DIAGNOSIS — L089 Local infection of the skin and subcutaneous tissue, unspecified: Secondary | ICD-10-CM | POA: Diagnosis not present

## 2021-09-30 DIAGNOSIS — T8140XA Infection following a procedure, unspecified, initial encounter: Secondary | ICD-10-CM | POA: Diagnosis not present

## 2021-09-30 MED ORDER — DOXYCYCLINE HYCLATE 100 MG PO CAPS
100.0000 mg | ORAL_CAPSULE | Freq: Two times a day (BID) | ORAL | 0 refills | Status: DC
  Filled 2021-09-30: qty 28, 14d supply, fill #0

## 2021-10-06 ENCOUNTER — Other Ambulatory Visit (HOSPITAL_COMMUNITY): Payer: Self-pay

## 2021-10-06 ENCOUNTER — Telehealth: Payer: 59 | Admitting: Family

## 2021-10-06 DIAGNOSIS — L255 Unspecified contact dermatitis due to plants, except food: Secondary | ICD-10-CM

## 2021-10-06 MED ORDER — QUETIAPINE FUMARATE 400 MG PO TABS
400.0000 mg | ORAL_TABLET | Freq: Every day | ORAL | 0 refills | Status: DC
  Filled 2021-10-06: qty 30, 30d supply, fill #0

## 2021-10-07 ENCOUNTER — Other Ambulatory Visit (HOSPITAL_COMMUNITY): Payer: Self-pay

## 2021-10-07 MED ORDER — ALPRAZOLAM 0.5 MG PO TABS
0.5000 mg | ORAL_TABLET | Freq: Every day | ORAL | 0 refills | Status: DC | PRN
  Filled 2021-10-07: qty 30, 30d supply, fill #0

## 2021-10-07 MED ORDER — DIAZEPAM 10 MG PO TABS
10.0000 mg | ORAL_TABLET | Freq: Every evening | ORAL | 0 refills | Status: DC | PRN
  Filled 2021-10-07: qty 30, 30d supply, fill #0

## 2021-10-07 MED ORDER — TRIAMCINOLONE ACETONIDE 0.5 % EX OINT
1.0000 "application " | TOPICAL_OINTMENT | Freq: Two times a day (BID) | CUTANEOUS | 0 refills | Status: DC
  Filled 2021-10-07: qty 30, 15d supply, fill #0

## 2021-10-07 MED ORDER — PREDNISONE 10 MG PO TABS
ORAL_TABLET | ORAL | 0 refills | Status: DC
Start: 2021-10-07 — End: 2022-06-21
  Filled 2021-10-07: qty 37, 14d supply, fill #0

## 2021-10-07 NOTE — Progress Notes (Signed)
E-Visit for [employer]  We are sorry that you are not feeing well.  Here is how we plan to help!  Based on what you have shared with me it looks like you have had an allergic reaction to the oily resin from a group of plants.  This resin is very sticky, so it easily attaches to your skin, clothing, tools equipment, and pet's fur.    This blistering rash is often called poison ivy rash although it can come from contact with the leaves, stems and roots of poison ivy, poison oak and poison sumac.  The oily resin contains urushiol (u-ROO-she-ol) that produces a skin rash on exposed skin.  The severity of the rash depends on the amount of urushiol that gets on your skin.  A section of skin with more urushiol on it may develop a rash sooner.  The rash usually develops 12-48 hours after exposure and can last two to three weeks.  Your skin must come in direct contact with the plant's oil to be affected.  Blister fluid doesn't spread the rash.  However, if you come into contact with a piece of clothing or pet fur that has urushiol on it, the rash may spread out.  You can also transfer the oil to other parts of your body with your fingers.  Often the rash looks like a straight line because of the way the plant brushes against your skin.  Since your rash is widespread or has resulted in a large number of blisters, I have prescribed an oral corticosteroid.  Please follow these recommendations:  I have sent a prednisone dose pack to your chosen pharmacy. Be sure to follow the instructions carefully and complete the entire prescription. You may use Benadryl or Caladryl topical lotions to sooth the itch and remember cool, not hot, showers and baths can help relieve the itching!  Place cool, wet compresses on the affected area for 15-30 minutes several times a day.  You may also take oral antihistamines, such as diphenhydramine (Benadryl, others), which may also help you sleep better.  Watch your skin for any purulent  (pus) drainage or red streaking from the site.  If this occurs, contact your provider.  You may require an antibiotic for a skin infection.  Make sure that the clothes you were wearing as well as any towels or sheets that may have come in contact with the oil (urushiol) are washed in detergent and hot water.       I have developed the following plan to treat your condition I am prescribing a two week course of steroids (37 tablets of 10 mg prednisone).  Days 1-4 take 4 tablets (40 mg) daily  Days 5-8 take 3 tablets (30 mg) daily, Days 9-11 take 2 tablets (20 mg) daily, Days 12-14 take 1 tablet (10 mg) daily.  I have also sent in Kenalog cream (a steroid cream) you can apply twice a day. Avoid your face.   What can you do to prevent this rash?  Avoid the plants.  Learn how to identify poison ivy, poison oak and poison sumac in all seasons.  When hiking or engaging in other activities that might expose you to these plants, try to stay on cleared pathways.  If camping, make sure you pitch your tent in an area free of these plants.  Keep pets from running through wooded areas so that urushiol doesn't accidentally stick to their fur, which you may touch.  Remove or kill the plants.  In your yard, you can get rid of poison ivy by applying an herbicide or pulling it out of the ground, including the roots, while wearing heavy gloves.  Afterward remove the gloves and thoroughly wash them and your hands.  Don't burn poison ivy or related plants because the urushiol can be carried by smoke.  Wear protective clothing.  If needed, protect your skin by wearing socks, boots, pants, long sleeves and vinyl gloves.  Wash your skin right away.  Washing off the oil with soap and water within 30 minutes of exposure may reduce your chances of getting a poison ivy rash.  Even washing after an hour or so can help reduce the severity of the rash.  If you walk through some poison ivy and then later touch your shoes, you may get  some urushiol on your hands, which may then transfer to your face or body by touching or rubbing.  If the contaminated object isn't cleaned, the urushiol on it can still cause a skin reaction years later.    Be careful not to reuse towels after you have washed your skin.  Also carefully wash clothing in detergent and hot water to remove all traces of the oil.  Handle contaminated clothing carefully so you don't transfer the urushiol to yourself, furniture, rugs or appliances.  Remember that pets can carry the oil on their fur and paws.  If you think your pet may be contaminated with urushiol, put on some long rubber gloves and give your pet a bath.  Finally, be careful not to burn these plants as the smoke can contain traces of the oil.  Inhaling the smoke may result in difficulty breathing. If that occurred you should see a physician as soon as possible.  See your doctor right away if:  The reaction is severe or widespread You inhaled the smoke from burning poison ivy and are having difficulty breathing Your skin continues to swell The rash affects your eyes, mouth or genitals Blisters are oozing pus You develop a fever greater than 100 F (37.8 C) The rash doesn't get better within a few weeks.  If you scratch the poison ivy rash, bacteria under your fingernails may cause the skin to become infected.  See your doctor if pus starts oozing from the blisters.  Treatment generally includes antibiotics.  Poison ivy treatments are usually limited to self-care methods.  And the rash typically goes away on its own in two to three weeks.     If the rash is widespread or results in a large number of blisters, your doctor may prescribe an oral corticosteroid, such as prednisone.  If a bacterial infection has developed at the rash site, your doctor may give you a prescription for an oral antibiotic.  MAKE SURE YOU  Understand these instructions. Will watch your condition. Will get help right away  if you are not doing well or get worse.   Thank you for choosing an e-visit.  Your e-visit answers were reviewed by a board certified advanced clinical practitioner to complete your personal care plan. Depending upon the condition, your plan could have included both over the counter or prescription medications.  Please review your pharmacy choice. Make sure the pharmacy is open so you can pick up prescription now. If there is a problem, you may contact your provider through [employer] and have the prescription routed to another pharmacy.  Your safety is important to [place]. If you have drug allergies check your prescription carefully.  For the next 24 hours you can use MyChart to ask questions about today's visit, request a non-urgent call back, or ask for a work or school excuse. You will get an email in the next two days asking about your experience. I hope that your e-visit has been valuable and will speed your recovery.   Approximately 5 minutes was spent documenting and reviewing patient's chart.

## 2021-10-08 ENCOUNTER — Other Ambulatory Visit (HOSPITAL_COMMUNITY): Payer: Self-pay

## 2021-10-08 DIAGNOSIS — Z419 Encounter for procedure for purposes other than remedying health state, unspecified: Secondary | ICD-10-CM | POA: Diagnosis not present

## 2021-10-12 ENCOUNTER — Other Ambulatory Visit (HOSPITAL_COMMUNITY): Payer: Self-pay

## 2021-10-13 ENCOUNTER — Other Ambulatory Visit (HOSPITAL_COMMUNITY): Payer: Self-pay

## 2021-10-19 ENCOUNTER — Other Ambulatory Visit (HOSPITAL_COMMUNITY): Payer: Self-pay

## 2021-10-19 MED ORDER — OXCARBAZEPINE 300 MG PO TABS
300.0000 mg | ORAL_TABLET | Freq: Every evening | ORAL | 0 refills | Status: DC
  Filled 2021-10-19: qty 30, 30d supply, fill #0

## 2021-10-20 ENCOUNTER — Other Ambulatory Visit (HOSPITAL_COMMUNITY): Payer: Self-pay

## 2021-10-27 ENCOUNTER — Other Ambulatory Visit (HOSPITAL_COMMUNITY): Payer: Self-pay

## 2021-10-27 DIAGNOSIS — F3181 Bipolar II disorder: Secondary | ICD-10-CM | POA: Diagnosis not present

## 2021-10-27 DIAGNOSIS — F4312 Post-traumatic stress disorder, chronic: Secondary | ICD-10-CM | POA: Diagnosis not present

## 2021-10-27 MED ORDER — DULOXETINE HCL 60 MG PO CPEP
120.0000 mg | ORAL_CAPSULE | Freq: Every morning | ORAL | 1 refills | Status: DC
  Filled 2021-11-09: qty 180, 90d supply, fill #0
  Filled 2022-02-04: qty 180, 90d supply, fill #1

## 2021-10-27 MED ORDER — ALPRAZOLAM 0.5 MG PO TABS
0.5000 mg | ORAL_TABLET | Freq: Two times a day (BID) | ORAL | 0 refills | Status: DC | PRN
  Filled 2022-02-04: qty 60, 30d supply, fill #0

## 2021-10-27 MED ORDER — CLONIDINE HCL 0.2 MG PO TABS
0.2000 mg | ORAL_TABLET | Freq: Every evening | ORAL | 0 refills | Status: DC
  Filled 2022-05-25: qty 90, 90d supply, fill #0

## 2021-10-27 MED ORDER — ALPRAZOLAM 0.5 MG PO TABS
0.5000 mg | ORAL_TABLET | Freq: Two times a day (BID) | ORAL | 1 refills | Status: DC | PRN
  Filled 2021-11-03: qty 68, 34d supply, fill #0
  Filled 2021-11-03: qty 60, 30d supply, fill #0
  Filled 2021-12-07: qty 68, 34d supply, fill #1
  Filled 2022-01-07: qty 60, 30d supply, fill #2
  Filled 2022-03-03 – 2022-03-05 (×2): qty 60, 30d supply, fill #3
  Filled 2022-04-02: qty 60, 30d supply, fill #4

## 2021-10-27 MED ORDER — DULOXETINE HCL 60 MG PO CPEP: 120.0000 mg | ORAL_CAPSULE | Freq: Every morning | ORAL | 0 refills | Status: DC

## 2021-10-27 MED ORDER — QUETIAPINE FUMARATE 400 MG PO TABS: 400.0000 mg | ORAL_TABLET | Freq: Every day | ORAL | 0 refills | Status: DC

## 2021-10-27 MED ORDER — DIAZEPAM 10 MG PO TABS
10.0000 mg | ORAL_TABLET | Freq: Every evening | ORAL | 0 refills | Status: DC | PRN
  Filled 2021-11-06: qty 30, 30d supply, fill #0

## 2021-10-27 MED ORDER — OXCARBAZEPINE 300 MG PO TABS: 300.0000 mg | ORAL_TABLET | Freq: Every day | ORAL | 0 refills | Status: DC

## 2021-10-27 MED ORDER — PRAMIPEXOLE DIHYDROCHLORIDE 0.75 MG PO TABS
0.7500 mg | ORAL_TABLET | Freq: Every day | ORAL | 1 refills | Status: DC
  Filled 2021-11-24: qty 90, 90d supply, fill #0
  Filled 2022-02-22: qty 90, 90d supply, fill #1

## 2021-10-27 MED ORDER — QUETIAPINE FUMARATE 400 MG PO TABS
400.0000 mg | ORAL_TABLET | Freq: Every day | ORAL | 1 refills | Status: DC
  Filled 2021-11-03: qty 90, 90d supply, fill #0

## 2021-10-27 MED ORDER — CLONIDINE HCL 0.2 MG PO TABS
0.2000 mg | ORAL_TABLET | Freq: Every day | ORAL | 1 refills | Status: DC
  Filled 2021-11-24: qty 90, 90d supply, fill #0
  Filled 2022-02-22: qty 90, 90d supply, fill #1

## 2021-10-27 MED ORDER — PRAMIPEXOLE DIHYDROCHLORIDE 0.75 MG PO TABS: 0.7500 mg | ORAL_TABLET | Freq: Every day | ORAL | 0 refills | Status: DC

## 2021-10-27 MED ORDER — CYCLOBENZAPRINE HCL 10 MG PO TABS
10.0000 mg | ORAL_TABLET | Freq: Every day | ORAL | 0 refills | Status: DC
  Filled 2021-10-27: qty 30, 30d supply, fill #0

## 2021-10-27 MED ORDER — CYCLOBENZAPRINE HCL 10 MG PO TABS: 10.0000 mg | ORAL_TABLET | Freq: Every evening | ORAL | 1 refills | Status: DC

## 2021-10-27 MED ORDER — ALPRAZOLAM ER 2 MG PO TB24
2.0000 mg | ORAL_TABLET | Freq: Every evening | ORAL | 0 refills | Status: DC
  Filled 2022-04-02: qty 30, 30d supply, fill #0

## 2021-10-27 MED ORDER — QUETIAPINE FUMARATE 400 MG PO TABS
400.0000 mg | ORAL_TABLET | Freq: Every evening | ORAL | 0 refills | Status: DC
  Filled 2021-10-27: qty 30, 30d supply, fill #0

## 2021-10-27 MED ORDER — OXCARBAZEPINE 300 MG PO TABS
300.0000 mg | ORAL_TABLET | Freq: Every day | ORAL | 1 refills | Status: DC
  Filled 2021-11-16: qty 90, 90d supply, fill #0

## 2021-10-27 MED ORDER — ALPRAZOLAM ER 2 MG PO TB24
2.0000 mg | ORAL_TABLET | Freq: Every day | ORAL | 1 refills | Status: DC
  Filled 2021-11-09: qty 90, 90d supply, fill #0
  Filled 2021-11-10 (×2): qty 30, 30d supply, fill #0
  Filled 2021-12-08: qty 30, 30d supply, fill #1
  Filled 2022-01-07: qty 30, 30d supply, fill #2
  Filled 2022-02-04: qty 30, 30d supply, fill #3
  Filled 2022-03-03: qty 30, 30d supply, fill #4
  Filled 2022-03-05: qty 25, 25d supply, fill #4
  Filled 2022-03-05: qty 5, 5d supply, fill #4
  Filled 2022-03-05: qty 30, 30d supply, fill #4

## 2021-10-27 MED ORDER — CARISOPRODOL 350 MG PO TABS
350.0000 mg | ORAL_TABLET | Freq: Every day | ORAL | 0 refills | Status: DC | PRN
  Filled 2021-10-27: qty 90, 90d supply, fill #0

## 2021-11-03 ENCOUNTER — Other Ambulatory Visit (HOSPITAL_COMMUNITY): Payer: Self-pay

## 2021-11-06 ENCOUNTER — Other Ambulatory Visit (HOSPITAL_COMMUNITY): Payer: Self-pay
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
## 2021-11-08 DIAGNOSIS — Z419 Encounter for procedure for purposes other than remedying health state, unspecified: Secondary | ICD-10-CM | POA: Diagnosis not present

## 2021-11-09 ENCOUNTER — Other Ambulatory Visit (HOSPITAL_COMMUNITY): Payer: Self-pay

## 2021-11-10 ENCOUNTER — Ambulatory Visit: Payer: 59 | Admitting: Neurology

## 2021-11-10 ENCOUNTER — Other Ambulatory Visit (HOSPITAL_COMMUNITY): Payer: Self-pay

## 2021-11-16 ENCOUNTER — Other Ambulatory Visit (HOSPITAL_COMMUNITY): Payer: Self-pay

## 2021-11-24 ENCOUNTER — Other Ambulatory Visit (HOSPITAL_COMMUNITY): Payer: Self-pay

## 2021-12-03 ENCOUNTER — Other Ambulatory Visit (HOSPITAL_COMMUNITY): Payer: Self-pay

## 2021-12-04 ENCOUNTER — Other Ambulatory Visit (HOSPITAL_COMMUNITY): Payer: Self-pay

## 2021-12-07 ENCOUNTER — Other Ambulatory Visit (HOSPITAL_COMMUNITY): Payer: Self-pay

## 2021-12-08 ENCOUNTER — Other Ambulatory Visit (HOSPITAL_COMMUNITY): Payer: Self-pay

## 2021-12-09 ENCOUNTER — Other Ambulatory Visit (HOSPITAL_COMMUNITY): Payer: Self-pay

## 2021-12-09 DIAGNOSIS — Z419 Encounter for procedure for purposes other than remedying health state, unspecified: Secondary | ICD-10-CM | POA: Diagnosis not present

## 2021-12-15 ENCOUNTER — Other Ambulatory Visit (HOSPITAL_COMMUNITY): Payer: Self-pay

## 2021-12-30 ENCOUNTER — Other Ambulatory Visit (HOSPITAL_COMMUNITY): Payer: Self-pay

## 2022-01-05 ENCOUNTER — Other Ambulatory Visit (HOSPITAL_COMMUNITY): Payer: Self-pay

## 2022-01-06 ENCOUNTER — Other Ambulatory Visit (HOSPITAL_COMMUNITY): Payer: Self-pay

## 2022-01-06 DIAGNOSIS — Z419 Encounter for procedure for purposes other than remedying health state, unspecified: Secondary | ICD-10-CM | POA: Diagnosis not present

## 2022-01-06 MED ORDER — DIAZEPAM 10 MG PO TABS
10.0000 mg | ORAL_TABLET | Freq: Every evening | ORAL | 0 refills | Status: DC | PRN
  Filled 2022-01-06: qty 30, 30d supply, fill #0

## 2022-01-07 ENCOUNTER — Other Ambulatory Visit (HOSPITAL_COMMUNITY): Payer: Self-pay

## 2022-01-07 DIAGNOSIS — F3181 Bipolar II disorder: Secondary | ICD-10-CM | POA: Diagnosis not present

## 2022-01-07 MED ORDER — MODAFINIL 100 MG PO TABS
100.0000 mg | ORAL_TABLET | Freq: Every day | ORAL | 2 refills | Status: DC | PRN
Start: 2022-01-07 — End: 2022-06-21
  Filled 2022-01-07: qty 10, 10d supply, fill #0

## 2022-01-07 MED ORDER — QUETIAPINE FUMARATE 300 MG PO TABS
300.0000 mg | ORAL_TABLET | Freq: Every day | ORAL | 0 refills | Status: DC
  Filled 2022-01-07: qty 90, 90d supply, fill #0

## 2022-01-08 ENCOUNTER — Other Ambulatory Visit (HOSPITAL_COMMUNITY): Payer: Self-pay

## 2022-01-27 ENCOUNTER — Encounter: Payer: Self-pay | Admitting: Psychology

## 2022-01-27 DIAGNOSIS — F411 Generalized anxiety disorder: Secondary | ICD-10-CM | POA: Insufficient documentation

## 2022-01-27 DIAGNOSIS — K219 Gastro-esophageal reflux disease without esophagitis: Secondary | ICD-10-CM | POA: Insufficient documentation

## 2022-01-27 DIAGNOSIS — M797 Fibromyalgia: Secondary | ICD-10-CM | POA: Insufficient documentation

## 2022-01-28 ENCOUNTER — Ambulatory Visit (INDEPENDENT_AMBULATORY_CARE_PROVIDER_SITE_OTHER): Payer: 59 | Admitting: Psychology

## 2022-01-28 ENCOUNTER — Ambulatory Visit: Payer: 59 | Admitting: Psychology

## 2022-01-28 ENCOUNTER — Other Ambulatory Visit: Payer: Self-pay

## 2022-01-28 DIAGNOSIS — S060X0S Concussion without loss of consciousness, sequela: Secondary | ICD-10-CM | POA: Diagnosis not present

## 2022-01-28 DIAGNOSIS — R4189 Other symptoms and signs involving cognitive functions and awareness: Secondary | ICD-10-CM | POA: Diagnosis not present

## 2022-01-28 DIAGNOSIS — F332 Major depressive disorder, recurrent severe without psychotic features: Secondary | ICD-10-CM

## 2022-01-28 DIAGNOSIS — M797 Fibromyalgia: Secondary | ICD-10-CM | POA: Diagnosis not present

## 2022-01-28 DIAGNOSIS — F1210 Cannabis abuse, uncomplicated: Secondary | ICD-10-CM

## 2022-01-28 DIAGNOSIS — F411 Generalized anxiety disorder: Secondary | ICD-10-CM | POA: Diagnosis not present

## 2022-01-28 NOTE — Progress Notes (Addendum)
? ?NEUROPSYCHOLOGICAL EVALUATION ?[place]. [facility] ?[place] Department of Neurology ? ?Date of Evaluation: January 28, 2022 ? ?Reason for Referral:  ? ?Carl Hughes is a 31 y.o. right-handed Caucasian male referred by Carl Hughes, D.O., to characterize his current cognitive functioning and assist with diagnostic clarity and treatment planning in the context of subjective cognitive decline, numerous psychiatric comorbidities, polysubstance abuse/dependence, and recently sustained concussion.  ? ?Assessment and Plan:  ? ?Clinical Impression(s): ?When asked for what he was hoping to get from the current evaluation, he responded "to know I'm not retarded." His mother provided a similar answer surrounding wishing to know current strengths and weaknesses, as well as broad intellectual abilities. As such, IQ testing was prioritized over standard memory testing. Mr. Carl Hughes fatigued considerably as the evaluation progressed to the extent that he appeared to fall asleep during testing and was difficult to wake. When awoken, he stated that he was "exhausted" and could not continue. As such, the evaluation was abbreviated in response and traditional memory testing was unable to be performed.  ? ?Across tasks which were completed, Mr. Carl Hughes pattern of performance is suggestive of an isolated weakness across phonemic fluency. He also self-discontinued a motorized sequencing task (TMT B) due to emerging confusion. Performance was appropriate across all other assessed cognitive domains. This includes processing speed, attention/concentration, executive functioning (outside of TMT B), receptive language, semantic fluency, and visuospatial abilities. Confrontation naming and memory capabilities were unable to be assessed due to fatigue. Across IQ testing, performances scored in the average range (full scale IQ = 95, 37th percentile; GAI = 100; 50th percentile) with a relative weakness (below average) across processing  speed tasks.  ? ?Across mood-related questionnaires, he reported acute symptoms of severe depression and moderate anxiety. Active suicidal ideation was inferred based on his avoidance in directly answering related questions. However, he did deny having active intent or a plan to act upon suicidal thoughts. While we were unable to complete a PTSD symptom checklist, I believe that he would also have elevated this scale based on his history and reporting during interview. Ongoing psychiatric distress (i.e., anxiety, depression, PTSD) will certainly impact cognitive inefficiency and negatively impact day-to-day functioning. These symptoms most commonly affect processing speed and attention/concentration. Inefficiencies in these ares will in turn affect executive functioning and primarily learning aspects of memory.  ? ?In addition to mood concerns, Mr. Carl Hughes has a lengthy history of polysubstance abuse. Substance abuse (including alcohol abuse) will also directly affect cognitive abilities and can affect similar domains which are most often affected with psychiatric distress. 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 Damage to this brain area could certainly create dysfunction surrounding executive functioning (e.g., multi-tasking, cognitive flexibility, problem solving, reasoning). Acutely, he denied substance use outside of daily marijuana and prescribed prescription medication use. However, it is important to highlight that marijuana use will directly influence processing speed and attention/concentration. Furthermore, many of his current medications (namely alprazolam/Xanax, diazepam/Valium, oxcarbazepine/Trileptal, prednisone/Deltasone, and quetiapine/Seroquel) have well-known cognitive side effects.  ? ?Overall, the most likely cause of Mr. Carl Hughes subjective cognitive dysfunction is a combination of past substance  abuse/dependence, longstanding and severe psychiatric distress, current medication side effects, and current daily marijuana use. I do not believe that his January concussion is playing a significant or active role in his current presentation outside of perhaps acutely worsening some of the factors described above. However, I am not surprised that  he is having some persisting symptoms as the risk of persisting post-concussion symptoms is increased with significant psychiatric distress, substance use/abuse, and prior concussive injuries with prolonged recovery. There is no evidence for any permanent brain damage or neurological dysfunction stemming from his head injury. Continued medical and psychiatric monitoring will be important moving forward.  ? ?Recommendations: ?A combination of medication and psychotherapy has been shown to be most effective at treating symptoms of psychiatric distress. As such, Mr. Carl Hughes is encouraged to continued working with his psychiatrist regarding medication adjustments to optimally manage these symptoms.  ? ?Likewise, Mr. Carl Hughes is encouraged to consider engaging in short-term psychotherapy to address symptoms of psychiatric distress. I understand that he has not had positive experiences while in therapy in the past. However, the likelihood that he will achieve psychiatric remission is lower when using a solely pharmaceutical approach. This is especially true of ongoing PTSD symptoms. ? ?He has done well to diminish substance abuse from previously higher and much more dangerous levels. I would encourage him to diminish marijuana use as daily use will create and worsen cognitive inefficiencies on a daily basis.  ? ?Mr. Carl Hughes is encouraged to attend to lifestyle factors for brain health (e.g., regular physical exercise, good nutrition habits, regular participation in cognitively-stimulating activities, and general stress management techniques), which are likely to have benefits for both  emotional adjustment and cognition. In fact, in addition to promoting good general health, regular exercise incorporating aerobic activities (e.g., brisk walking, jogging, cycling, etc.) has been demonstrated to be a very effective treatment for depression and stress, with similar efficacy rates to both antidepressant medication and psychotherapy. ? ?When learning new information, he would benefit from information being broken up into small, manageable pieces. He may also find it helpful to articulate the material in his own words and in a context to promote encoding at the onset of a new task. This material may need to be repeated multiple times to promote encoding. ? ?Memory can be improved using internal strategies such as rehearsal, repetition, chunking, mnemonics, association, and imagery. External strategies such as written notes in a consistently used memory journal, visual and nonverbal auditory cues such as a calendar on the refrigerator or appointments with alarm, such as on a cell phone, can also help maximize recall.   ? ?To address problems with processing speed, he may wish to consider: ?  -Ensuring that he is alerted when essential material or instructions are being presented ?  -Adjusting the speed at which new information is presented ?  -Allowing for more time in comprehending, processing, and responding in conversation ? ?To address problems with fluctuating attention, he may wish to consider: ?  -Avoiding external distractions when needing to concentrate ?  -Limiting exposure to fast paced environments with multiple sensory demands ?  -Writing down complicated information and using checklists ?  -Attempting and completing one task at a time (i.e., no multi-tasking) ?  -Verbalizing aloud each step of a task to maintain focus ?  -Reducing the amount of information considered at one time ? ?Review of Records:  ? ?Psychiatry records from September-October 2014 within [facility] were able to be  reviewed. These suggest a longstanding history of significant depression and anxiety with diagnostic differentials including anxiety/depression, bipolar disorder, and schizoaffective disorder. Alcohol and

## 2022-01-28 NOTE — Progress Notes (Signed)
? ?  Psychometrician Note ?  ?Cognitive testing was administered to Carl Hughes by Milana Kidney, B.S. (psychometrist) under the supervision of Dr. Christia Reading, Ph.D., licensed psychologist on 01/28/2022. Carl Hughes did not appear overtly distressed by the testing session per behavioral observation or responses across self-report questionnaires. Rest breaks were offered.  ?  ?The battery of tests administered was selected by Dr. Christia Reading, Ph.D. with consideration to Carl Hughes's current level of functioning, the nature of his symptoms, emotional and behavioral responses during interview, level of literacy, observed level of motivation/effort, and the nature of the referral question. This battery was communicated to the psychometrist. Communication between Dr. Christia Reading, Ph.D. and the psychometrist was ongoing throughout the evaluation and Dr. Christia Reading, Ph.D. was immediately accessible at all times. Dr. Christia Reading, Ph.D. provided supervision to the psychometrist on the date of this service to the extent necessary to assure the quality of all services provided.  ?  ?[name] will return within approximately 1-2 weeks for an interactive feedback session with Dr. Melvyn Novas at which time his test performances, clinical impressions, and treatment recommendations will be reviewed in detail. Carl Hughes understands he can contact our office should he require our assistance before this time. ? ?A total of 135 minutes of billable time were spent face-to-face with Carl Hughes by the psychometrist. This includes both test administration and scoring time. Billing for these services is reflected in the clinical report generated by Dr. Christia Reading, Ph.D. ? ?This note reflects time spent with the psychometrician and does not include test scores or any clinical interpretations made by Dr. Melvyn Novas. The full report will follow in a separate note.  ?

## 2022-01-29 ENCOUNTER — Encounter: Payer: Self-pay | Admitting: Psychology

## 2022-02-01 ENCOUNTER — Other Ambulatory Visit (HOSPITAL_COMMUNITY): Payer: Self-pay

## 2022-02-04 ENCOUNTER — Other Ambulatory Visit (HOSPITAL_COMMUNITY): Payer: Self-pay

## 2022-02-04 ENCOUNTER — Ambulatory Visit (INDEPENDENT_AMBULATORY_CARE_PROVIDER_SITE_OTHER): Payer: 59 | Admitting: Psychology

## 2022-02-04 DIAGNOSIS — S060X0S Concussion without loss of consciousness, sequela: Secondary | ICD-10-CM | POA: Diagnosis not present

## 2022-02-04 DIAGNOSIS — F332 Major depressive disorder, recurrent severe without psychotic features: Secondary | ICD-10-CM | POA: Diagnosis not present

## 2022-02-04 DIAGNOSIS — F411 Generalized anxiety disorder: Secondary | ICD-10-CM

## 2022-02-04 NOTE — Progress Notes (Signed)
? ?  Neuropsychology Feedback Session ?[place]. [facility] ?[place] Department of Neurology ? ?Reason for Referral:  ? ?Carl Hughes is a 31 y.o. right-handed Caucasian male referred by Metta Clines, D.O., to characterize his current cognitive functioning and assist with diagnostic clarity and treatment planning in the context of subjective cognitive decline, numerous psychiatric comorbidities, polysubstance abuse/dependence, and recently sustained concussion.  ? ?Feedback:  ? ?[name] completed a comprehensive neuropsychological evaluation on 01/28/2022. Please refer to that encounter for the full report and recommendations. Briefly, results suggested an isolated weakness across phonemic fluency. He also self-discontinued a motorized sequencing task (TMT B) due to emerging confusion. Performance was appropriate across all other assessed cognitive domains. This includes processing speed, attention/concentration, executive functioning (outside of TMT B), receptive language, semantic fluency, and visuospatial abilities. Confrontation naming and memory capabilities were unable to be assessed due to fatigue. Across IQ testing, performances scored in the average range (full scale IQ = 95, 37th percentile; GAI = 100; 50th percentile) with a relative weakness (below average) across processing speed tasks. Overall, the most likely cause of [name] subjective cognitive dysfunction is a combination of past substance abuse/dependence, longstanding and severe psychiatric distress, current medication side effects, and current daily marijuana use. I do not believe that his January concussion is playing a significant or active role in his current presentation outside of perhaps acutely worsening some of the factors described above. However, I am not surprised that he is having some persisting symptoms as the risk of persisting post-concussion symptoms is increased with significant psychiatric distress, substance  use/abuse, and prior concussive injuries with prolonged recovery. ? ?[name] was accompanied by his mother during the current telephone call. They were within their residence while I was within my office. I discussed the limitations of evaluation and management by telemedicine and the availability of in person appointments. [name] expressed his understanding and agreed to proceed. Content of the current session focused on the results of his neuropsychological evaluation. [name] was given the opportunity to ask questions and his questions were answered. He was encouraged to reach out should additional questions arise. A copy of his report was mailed at the conclusion of the visit.  ? ?  ? ?18 minutes were spent conducting the current feedback session with [name], billed as one unit [phone].  ?

## 2022-02-05 ENCOUNTER — Other Ambulatory Visit (HOSPITAL_COMMUNITY): Payer: Self-pay

## 2022-02-05 MED ORDER — DIAZEPAM 10 MG PO TABS
10.0000 mg | ORAL_TABLET | Freq: Every evening | ORAL | 0 refills | Status: DC | PRN
  Filled 2022-02-05: qty 30, 30d supply, fill #0

## 2022-02-06 DIAGNOSIS — Z419 Encounter for procedure for purposes other than remedying health state, unspecified: Secondary | ICD-10-CM | POA: Diagnosis not present

## 2022-02-10 ENCOUNTER — Telehealth (INDEPENDENT_AMBULATORY_CARE_PROVIDER_SITE_OTHER): Payer: 59 | Admitting: Medical

## 2022-02-10 ENCOUNTER — Encounter: Payer: Self-pay | Admitting: Medical

## 2022-02-10 ENCOUNTER — Other Ambulatory Visit (HOSPITAL_COMMUNITY): Payer: Self-pay

## 2022-02-10 VITALS — Ht 75.0 in | Wt 195.0 lb

## 2022-02-10 DIAGNOSIS — M62838 Other muscle spasm: Secondary | ICD-10-CM | POA: Diagnosis not present

## 2022-02-10 DIAGNOSIS — L299 Pruritus, unspecified: Secondary | ICD-10-CM

## 2022-02-10 DIAGNOSIS — R1013 Epigastric pain: Secondary | ICD-10-CM

## 2022-02-10 DIAGNOSIS — B354 Tinea corporis: Secondary | ICD-10-CM

## 2022-02-10 MED ORDER — TIZANIDINE HCL 4 MG PO TABS
4.0000 mg | ORAL_TABLET | Freq: Every evening | ORAL | 0 refills | Status: DC | PRN
Start: 2022-02-10 — End: 2024-07-31
  Filled 2022-02-10: qty 30, 30d supply, fill #0

## 2022-02-10 MED ORDER — CLOTRIMAZOLE-BETAMETHASONE 1-0.05 % EX CREA
1.0000 "application " | TOPICAL_CREAM | Freq: Every day | CUTANEOUS | 0 refills | Status: DC
  Filled 2022-02-10: qty 45, 30d supply, fill #0

## 2022-02-10 MED ORDER — SUCRALFATE 1 G PO TABS
1.0000 g | ORAL_TABLET | Freq: Three times a day (TID) | ORAL | 0 refills | Status: DC
  Filled 2022-02-10: qty 90, 30d supply, fill #0

## 2022-02-10 MED ORDER — HYDROXYZINE PAMOATE 25 MG PO CAPS
25.0000 mg | ORAL_CAPSULE | Freq: Two times a day (BID) | ORAL | 1 refills | Status: DC | PRN
  Filled 2022-02-10: qty 30, 15d supply, fill #0

## 2022-02-10 NOTE — Progress Notes (Signed)
Subjective:  ? Carl Hughes is a 31 y.o. male who presents for evaluation of a rash involving the leg and other areas.  ? ?This visit type was conducted due to national recommendations for restrictions regarding the COVID-19 Pandemic (e.g. social distancing) in an effort to limit this patient's exposure and mitigate transmission in our community.  Due to their co-morbid illnesses, this patient is at least at moderate risk for complications without adequate follow up.  This format is felt to be most appropriate for this patient at this time.   ? ?Documentation for virtual audio and video telecommunications through [place] encounter: ? ?The patient was located at home. ?The provider was located in the office. ?The patient did consent to this visit and is aware of possible charges through their insurance for this visit. ? ?The other persons participating in this telemedicine service were mother. ?Time spent on call was 20 minutes and in review of previous records >25 minutes total. ? ?This virtual service is not related to other E/M service within previous 7 days. ? ? ? ?HPI: ?Rash started on leg, but has spread to wrist, arm, back, neck.  He is scratching several lesions and is very itchy.  No sick contacts with similar.  Tried OTC antifungal for 5 days , not helping.   ? ?He notes ongoing chronic epigastric discomfort.  Says he has hx/o ulcers.   Lately flared up.  No blood in stool.  Has used medication in the past, but has not medicaiton currently.   No blood in stool.   ? ?He would like refill on muscle relaxer. Gets spasm from time to time.  No recent fall or injury. ? ? ?The following portions of the patient's history were reviewed and updated as appropriate: allergies, current medications, past family history, past medical history, past social history and problem list. ? ?Review of Systems ?As in subjective above ?  ?Objective:  ? ?Ht '6\' 3"'$  (1.905 m)   Wt 195 lb (88.5 kg)   BMI 24.37 kg/m?  ?Exam limited  as this was a virtual consult ?Physical Exam ?Vitals and nursing note reviewed.  ?Constitutional:   ?   Appearance: Normal appearance. He is not ill-appearing.  ?Skin: ?   General: Skin is warm and dry.  ?Neurological:  ?   Mental Status: He is alert.  ?Psychiatric:     ?   Behavior: Behavior normal.     ?   Thought Content: Thought content normal.     ?   Judgment: Judgment normal.  ? ? ? ? ?Assessment:  ? ?Encounter Diagnoses  ?Name Primary?  ? Tinea corporis Yes  ? Itching   ? Epigastric discomfort   ? Muscle spasm   ? ? ?  ?Plan:  ? ?We discussed limitations of virtual exam.  He has chronic health issues, numerous medications and most of his care is through psychiatry. ? ?Tinea corporis, itching-there is some component of neurotic scratching.  Begin hydroxyzine for itching.  Begin Lotrisone cream short-term.  Do not use on face.  If not much improved within the next 10 days then for recheck ? ?I do not see him for chronic pain.  However I have prescribed muscle laxer from time to time for spasm.  I refilled this today at his request as he has had some spasms of late ? ?We discussed his epigastric discomfort.  This is hard to assess fully virtually so he can consider coming in for in person visit.  I  reviewed back over his 2019 CT abdomen pelvis and 2017 endoscopy.  There were no obvious ulcers at that time.  He has tried multiple prior PPI and H2 blockers without a lot of relief.  I am going to try some sucralfate to see if this helps.  We discussed proper use of medication.  Follow-up in the next few weeks.  Avoid acidic and spicy foods and GERD triggers. ? ?Follow up in person in near future ? ?

## 2022-02-22 ENCOUNTER — Other Ambulatory Visit (HOSPITAL_COMMUNITY): Payer: Self-pay

## 2022-03-02 ENCOUNTER — Telehealth: Payer: Self-pay

## 2022-03-02 ENCOUNTER — Other Ambulatory Visit: Payer: Self-pay | Admitting: Medical

## 2022-03-02 MED ORDER — TERBINAFINE HCL 1 % EX CREA: 1.0000 "application " | TOPICAL_CREAM | Freq: Two times a day (BID) | CUTANEOUS | 0 refills | Status: DC

## 2022-03-02 NOTE — Telephone Encounter (Signed)
Pt states ringworm got little better now getting worse and spreading in different spots, would like something different called into [facility].   Does not need anymore Hydroxyzine.

## 2022-03-03 ENCOUNTER — Other Ambulatory Visit (HOSPITAL_COMMUNITY): Payer: Self-pay

## 2022-03-05 ENCOUNTER — Other Ambulatory Visit (HOSPITAL_COMMUNITY): Payer: Self-pay

## 2022-03-05 MED ORDER — DIAZEPAM 10 MG PO TABS
10.0000 mg | ORAL_TABLET | Freq: Every evening | ORAL | 0 refills | Status: DC | PRN
  Filled 2022-03-05: qty 30, 30d supply, fill #0

## 2022-03-08 ENCOUNTER — Other Ambulatory Visit (HOSPITAL_COMMUNITY): Payer: Self-pay

## 2022-03-08 DIAGNOSIS — Z419 Encounter for procedure for purposes other than remedying health state, unspecified: Secondary | ICD-10-CM | POA: Diagnosis not present

## 2022-03-30 ENCOUNTER — Telehealth: Payer: Self-pay | Admitting: Family Medicine

## 2022-03-30 ENCOUNTER — Other Ambulatory Visit: Payer: Self-pay | Admitting: Medical

## 2022-03-30 NOTE — Telephone Encounter (Signed)
Advised mom of same.

## 2022-03-30 NOTE — Telephone Encounter (Signed)
Mom called and pt needs refill on Zipsor.  He has had this rx for years and is now down to two pills.  He uses for Headache/head pressure only as needed.  Please send refill to [facility]

## 2022-04-02 ENCOUNTER — Other Ambulatory Visit: Payer: Self-pay | Admitting: Medical

## 2022-04-02 ENCOUNTER — Other Ambulatory Visit (HOSPITAL_COMMUNITY): Payer: Self-pay

## 2022-04-02 MED ORDER — DICLOFENAC POTASSIUM 25 MG PO CAPS
1.0000 | ORAL_CAPSULE | Freq: Every day | ORAL | 0 refills | Status: DC | PRN
  Filled 2022-04-02: qty 30, 30d supply, fill #0

## 2022-04-06 ENCOUNTER — Other Ambulatory Visit (HOSPITAL_COMMUNITY): Payer: Self-pay

## 2022-04-06 ENCOUNTER — Telehealth: Payer: Self-pay

## 2022-04-06 MED ORDER — QUETIAPINE FUMARATE 300 MG PO TABS
300.0000 mg | ORAL_TABLET | Freq: Every evening | ORAL | 0 refills | Status: DC
  Filled 2022-04-06: qty 30, 30d supply, fill #0

## 2022-04-06 NOTE — Telephone Encounter (Signed)
[facility] out of diclofenac can only order tablets, called [facility] t# [phone] has capsules called in #60,  Rx was ok per Audelia Acton

## 2022-04-07 ENCOUNTER — Other Ambulatory Visit (HOSPITAL_COMMUNITY): Payer: Self-pay

## 2022-04-07 MED ORDER — DIAZEPAM 10 MG PO TABS
10.0000 mg | ORAL_TABLET | Freq: Every evening | ORAL | 0 refills | Status: DC | PRN
  Filled 2022-04-07: qty 30, 30d supply, fill #0

## 2022-04-08 DIAGNOSIS — Z419 Encounter for procedure for purposes other than remedying health state, unspecified: Secondary | ICD-10-CM | POA: Diagnosis not present

## 2022-04-26 ENCOUNTER — Other Ambulatory Visit (HOSPITAL_COMMUNITY): Payer: Self-pay

## 2022-04-27 ENCOUNTER — Other Ambulatory Visit (HOSPITAL_COMMUNITY): Payer: Self-pay

## 2022-04-27 DIAGNOSIS — F4312 Post-traumatic stress disorder, chronic: Secondary | ICD-10-CM | POA: Diagnosis not present

## 2022-04-27 DIAGNOSIS — F3181 Bipolar II disorder: Secondary | ICD-10-CM | POA: Diagnosis not present

## 2022-04-27 DIAGNOSIS — Z79899 Other long term (current) drug therapy: Secondary | ICD-10-CM | POA: Diagnosis not present

## 2022-04-27 MED ORDER — DULOXETINE HCL 60 MG PO CPEP
120.0000 mg | ORAL_CAPSULE | Freq: Every morning | ORAL | 1 refills | Status: DC
  Filled 2022-04-27: qty 180, 90d supply, fill #0
  Filled 2022-07-30: qty 180, 90d supply, fill #1

## 2022-04-27 MED ORDER — DIAZEPAM 10 MG PO TABS
10.0000 mg | ORAL_TABLET | Freq: Every evening | ORAL | 1 refills | Status: DC | PRN
  Filled 2022-04-27 – 2022-05-04 (×2): qty 90, 90d supply, fill #0
  Filled 2022-05-04: qty 34, 34d supply, fill #0
  Filled 2022-05-04 – 2022-05-05 (×2): qty 30, 30d supply, fill #0
  Filled 2022-06-02: qty 30, 30d supply, fill #1
  Filled 2022-07-02: qty 30, 30d supply, fill #2
  Filled 2022-07-30: qty 30, 30d supply, fill #3

## 2022-04-27 MED ORDER — CLONIDINE HCL 0.2 MG PO TABS
0.2000 mg | ORAL_TABLET | Freq: Every evening | ORAL | 1 refills | Status: DC
Start: 2022-04-27 — End: 2023-03-11
  Filled 2022-04-27: qty 30, 30d supply, fill #0
  Filled 2022-07-30 – 2022-08-31 (×2): qty 90, 90d supply, fill #0
  Filled 2022-12-08: qty 90, 90d supply, fill #1

## 2022-04-27 MED ORDER — ALPRAZOLAM ER 2 MG PO TB24
2.0000 mg | ORAL_TABLET | Freq: Every evening | ORAL | 1 refills | Status: AC
  Filled 2022-04-27: qty 90, 90d supply, fill #0
  Filled 2022-05-04: qty 2, 2d supply, fill #0
  Filled 2022-05-04: qty 88, 88d supply, fill #0
  Filled 2022-05-05 – 2022-05-06 (×2): qty 28, 28d supply, fill #1
  Filled 2022-06-03: qty 30, 30d supply, fill #2
  Filled 2022-07-02: qty 30, 30d supply, fill #3
  Filled 2022-07-30: qty 30, 30d supply, fill #4
  Filled 2022-08-31: qty 30, 30d supply, fill #5

## 2022-04-27 MED ORDER — QUETIAPINE FUMARATE 300 MG PO TABS
300.0000 mg | ORAL_TABLET | Freq: Every evening | ORAL | 1 refills | Status: DC
  Filled 2022-04-27 – 2022-05-05 (×3): qty 90, 90d supply, fill #0
  Filled 2022-07-30 – 2022-08-02 (×2): qty 90, 90d supply, fill #1

## 2022-04-27 MED ORDER — CYCLOBENZAPRINE HCL 10 MG PO TABS
10.0000 mg | ORAL_TABLET | Freq: Every evening | ORAL | 1 refills | Status: AC
  Filled 2022-04-27: qty 30, 30d supply, fill #0

## 2022-04-27 MED ORDER — ALPRAZOLAM 0.5 MG PO TABS
0.5000 mg | ORAL_TABLET | Freq: Two times a day (BID) | ORAL | 1 refills | Status: AC | PRN
  Filled 2022-05-04: qty 60, 30d supply, fill #0
  Filled 2022-06-02: qty 60, 30d supply, fill #1
  Filled 2022-07-02: qty 60, 30d supply, fill #2
  Filled 2022-07-30: qty 60, 30d supply, fill #3
  Filled 2022-08-31: qty 60, 30d supply, fill #4

## 2022-04-27 MED ORDER — PRAMIPEXOLE DIHYDROCHLORIDE 0.75 MG PO TABS
0.7500 mg | ORAL_TABLET | Freq: Every evening | ORAL | 1 refills | Status: DC
  Filled 2022-04-27 – 2022-05-25 (×2): qty 90, 90d supply, fill #0
  Filled 2022-07-30 – 2022-08-31 (×2): qty 90, 90d supply, fill #1

## 2022-04-27 MED ORDER — MODAFINIL 100 MG PO TABS
100.0000 mg | ORAL_TABLET | Freq: Every day | ORAL | 3 refills | Status: DC | PRN
  Filled 2022-04-27: qty 10, 10d supply, fill #0

## 2022-05-04 ENCOUNTER — Other Ambulatory Visit (HOSPITAL_COMMUNITY): Payer: Self-pay

## 2022-05-05 ENCOUNTER — Other Ambulatory Visit (HOSPITAL_COMMUNITY): Payer: Self-pay

## 2022-05-06 ENCOUNTER — Other Ambulatory Visit (HOSPITAL_COMMUNITY): Payer: Self-pay

## 2022-05-08 DIAGNOSIS — Z419 Encounter for procedure for purposes other than remedying health state, unspecified: Secondary | ICD-10-CM | POA: Diagnosis not present

## 2022-05-12 DIAGNOSIS — L728 Other follicular cysts of the skin and subcutaneous tissue: Secondary | ICD-10-CM | POA: Diagnosis not present

## 2022-05-12 DIAGNOSIS — Z789 Other specified health status: Secondary | ICD-10-CM | POA: Diagnosis not present

## 2022-05-14 ENCOUNTER — Other Ambulatory Visit (HOSPITAL_COMMUNITY): Payer: Self-pay

## 2022-05-25 ENCOUNTER — Other Ambulatory Visit (HOSPITAL_COMMUNITY): Payer: Self-pay

## 2022-05-28 ENCOUNTER — Other Ambulatory Visit: Payer: Self-pay | Admitting: Medical

## 2022-05-28 ENCOUNTER — Telehealth: Payer: Self-pay | Admitting: Medical

## 2022-05-28 ENCOUNTER — Other Ambulatory Visit (HOSPITAL_COMMUNITY): Payer: Self-pay

## 2022-05-28 MED ORDER — CLOTRIMAZOLE-BETAMETHASONE 1-0.05 % EX CREA
1.0000 | TOPICAL_CREAM | Freq: Every day | CUTANEOUS | 0 refills | Status: DC
  Filled 2022-05-28: qty 45, 20d supply, fill #0

## 2022-05-28 NOTE — Telephone Encounter (Signed)
Pt has several more areas of the ringworm 3 new on his back, 1 on his shoulder, 1 on his side, couple on his leg.  He needs more cream.  He doesn't understand why this keeps coming back,  He has very good at hygiene,  washing bed linens, showers very frequently.  This has been going on now for like a year.

## 2022-06-02 ENCOUNTER — Other Ambulatory Visit (HOSPITAL_COMMUNITY): Payer: Self-pay

## 2022-06-03 ENCOUNTER — Other Ambulatory Visit (HOSPITAL_COMMUNITY): Payer: Self-pay

## 2022-06-08 DIAGNOSIS — Z419 Encounter for procedure for purposes other than remedying health state, unspecified: Secondary | ICD-10-CM | POA: Diagnosis not present

## 2022-06-18 NOTE — Telephone Encounter (Signed)
Appt made

## 2022-06-21 ENCOUNTER — Ambulatory Visit (INDEPENDENT_AMBULATORY_CARE_PROVIDER_SITE_OTHER): Payer: 59 | Admitting: Medical

## 2022-06-21 ENCOUNTER — Other Ambulatory Visit (HOSPITAL_COMMUNITY): Payer: Self-pay

## 2022-06-21 ENCOUNTER — Emergency Department (HOSPITAL_COMMUNITY)
Admission: EM | Admit: 2022-06-21 | Discharge: 2022-06-22 | Disposition: A | Discharge status: Home / Self Care | Payer: 59 | Attending: Emergency Medicine | Admitting: Emergency Medicine

## 2022-06-21 VITALS — BP 110/60 | HR 85 | Wt 193.6 lb

## 2022-06-21 DIAGNOSIS — Z79899 Other long term (current) drug therapy: Secondary | ICD-10-CM | POA: Diagnosis not present

## 2022-06-21 DIAGNOSIS — F319 Bipolar disorder, unspecified: Secondary | ICD-10-CM | POA: Diagnosis not present

## 2022-06-21 DIAGNOSIS — S8992XA Unspecified injury of left lower leg, initial encounter: Secondary | ICD-10-CM | POA: Diagnosis present

## 2022-06-21 DIAGNOSIS — Z20822 Contact with and (suspected) exposure to covid-19: Secondary | ICD-10-CM | POA: Diagnosis not present

## 2022-06-21 DIAGNOSIS — G8929 Other chronic pain: Secondary | ICD-10-CM

## 2022-06-21 DIAGNOSIS — M797 Fibromyalgia: Secondary | ICD-10-CM | POA: Diagnosis not present

## 2022-06-21 DIAGNOSIS — R21 Rash and other nonspecific skin eruption: Secondary | ICD-10-CM

## 2022-06-21 DIAGNOSIS — F1290 Cannabis use, unspecified, uncomplicated: Secondary | ICD-10-CM | POA: Insufficient documentation

## 2022-06-21 DIAGNOSIS — G44229 Chronic tension-type headache, not intractable: Secondary | ICD-10-CM

## 2022-06-21 DIAGNOSIS — S80812A Abrasion, left lower leg, initial encounter: Secondary | ICD-10-CM | POA: Diagnosis not present

## 2022-06-21 DIAGNOSIS — R519 Headache, unspecified: Secondary | ICD-10-CM | POA: Diagnosis not present

## 2022-06-21 DIAGNOSIS — S80811A Abrasion, right lower leg, initial encounter: Secondary | ICD-10-CM | POA: Insufficient documentation

## 2022-06-21 DIAGNOSIS — F430 Acute stress reaction: Secondary | ICD-10-CM

## 2022-06-21 DIAGNOSIS — R4689 Other symptoms and signs involving appearance and behavior: Secondary | ICD-10-CM

## 2022-06-21 LAB — CBC WITH DIFFERENTIAL/PLATELET
Abs Immature Granulocytes: 0.05 10*3/uL (ref 0.00–0.07)
Basophils Absolute: 0.1 10*3/uL (ref 0.0–0.1)
Basophils Relative: 1 %
Eosinophils Absolute: 0.3 10*3/uL (ref 0.0–0.5)
Eosinophils Relative: 2 %
HCT: 41.5 % (ref 39.0–52.0)
Hemoglobin: 14.7 g/dL (ref 13.0–17.0)
Immature Granulocytes: 0 %
Lymphocytes Relative: 18 %
Lymphs Abs: 2.2 10*3/uL (ref 0.7–4.0)
MCH: 32.6 pg (ref 26.0–34.0)
MCHC: 35.4 g/dL (ref 30.0–36.0)
MCV: 92 fL (ref 80.0–100.0)
Monocytes Absolute: 1 10*3/uL (ref 0.1–1.0)
Monocytes Relative: 9 %
Neutro Abs: 8.5 10*3/uL — ABNORMAL HIGH (ref 1.7–7.7)
Neutrophils Relative %: 70 %
Platelets: 253 10*3/uL (ref 150–400)
RBC: 4.51 MIL/uL (ref 4.22–5.81)
RDW: 13 % (ref 11.5–15.5)
WBC: 12.1 10*3/uL — ABNORMAL HIGH (ref 4.0–10.5)
nRBC: 0 % (ref 0.0–0.2)

## 2022-06-21 MED ORDER — CLOTRIMAZOLE-BETAMETHASONE 1-0.05 % EX CREA
1.0000 | TOPICAL_CREAM | Freq: Every day | CUTANEOUS | 0 refills | Status: DC
  Filled 2022-06-21: qty 45, 45d supply, fill #0
  Filled 2022-07-30: qty 45, 30d supply, fill #0

## 2022-06-21 MED ORDER — FLUCONAZOLE 150 MG PO TABS: 150.0000 mg | ORAL_TABLET | ORAL | 0 refills | Status: DC

## 2022-06-21 MED ORDER — CLOTRIMAZOLE-BETAMETHASONE 1-0.05 % EX CREA: 1.0000 | TOPICAL_CREAM | Freq: Every day | CUTANEOUS | 0 refills | Status: DC

## 2022-06-21 MED ORDER — FLUCONAZOLE 150 MG PO TABS
150.0000 mg | ORAL_TABLET | ORAL | 0 refills | Status: DC
  Filled 2022-06-21 – 2022-07-30 (×3): qty 4, 28d supply, fill #0
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
## 2022-06-21 NOTE — Progress Notes (Signed)
Subjective:  Carl Hughes is a 31 y.o. male who presents for Chief Complaint  Patient presents with   discuss skin issues    Skin issues- shoulders, back, sides and legs x 2 years. It will pop up anywhere. Needs referral     Here for skin concerns.  He thinks it could be recurrent ringworm.  He has several patches of rash on his body including arms, legs, torso that keep coming back for the last year.  He has used various creams but it does not ever seem to go away.  He gets new patches or patches that will go away and come back.  He is taking some type of over-the-counter mushroom supplement and was not sure if this was causing fungus rash  He has chronic pain and would like a referral back to the pain clinic.  He was seeing a pain clinic in [place] but has not seen them in a while.  Lately he has been dealing with the pain worse and would like to get back on a regimen with pain clinic.  No other aggravating or relieving factors.    No other c/o.  Past Medical History:  Diagnosis Date   Acute left-sided back pain 01/30/2014   Acute nonintractable headache 11/17/2020   Alcohol abuse 09/02/2013   Anal fissure    Benzodiazepine abuse/dependence 09/02/2013   Bipolar disorder, unspecified 01/30/2014   Carl Hughes, [facility]   Chronic back pain    Cocaine abuse 09/02/2013   reported cessation for several years   Concussion with no loss of consciousness 11/17/2020   Corn of foot 07/19/2018   Drug-seeking behavior    Exercise-induced asthma    Facial trauma 11/17/2020   Family history of adverse reaction to anesthesia    Family history of autoimmune disorder 09/08/2020   Fibromyalgia    Flank pain 08/01/2019   Gallbladder polyp    Generalized anxiety disorder    GERD (gastroesophageal reflux disease)    Hyperhidrosis of feet 07/19/2018   IBS (irritable bowel syndrome)    Insomnia 01/30/2014   Livedo reticularis 09/08/2020   Major depressive disorder  04/04/2014   Marijuana abuse, continuous 09/02/2013   Meralgia paresthetica 09/08/2020   Metatarsalgia of left foot 07/19/2018   Opiate abuse, episodic 09/02/2013   Paresthesia 09/08/2020   Pupil irregular of left eye 89/16/9450   Renal colic on left side 38/88/2800   Renal stone 08/01/2019   Skin rash 09/08/2020   Substance induced mood disorder 09/02/2013   Syncope 01/07/2020   Tobacco use disorder    Wears glasses    Current Outpatient Medications on File Prior to Visit  Medication Sig Dispense Refill   ALPRAZolam (XANAX XR) 2 MG 24 hr tablet Take 1 tablet (2 mg total) by mouth at bedtime. 90 tablet 1   ALPRAZolam (XANAX) 0.5 MG tablet Take 1 tablet (0.5 mg total) by mouth 2 (two) times daily as needed. 180 tablet 1   cloNIDine (CATAPRES) 0.2 MG tablet Take 1 tablet (0.2 mg total) by mouth at bedtime. 90 tablet 1   cyclobenzaprine (FLEXERIL) 10 MG tablet Take 1 tablet (10 mg total) by mouth at bedtime. 90 tablet 1   diazepam (VALIUM) 10 MG tablet Take 1 tablet (10 mg total) by mouth at bedtime as needed. 90 tablet 1   DULoxetine (CYMBALTA) 60 MG capsule Take 2 capsules (120 mg total) by mouth in the morning. 180 capsule 1   hydrOXYzine (VISTARIL) 25 MG capsule Take 1 capsule (  25 mg total) by mouth 2 (two) times daily as needed for itching 30 capsule 1   lidocaine (LIDODERM) 5 % Place 1 patch onto the skin daily. Remove & Discard patch within 12 hours or as directed by MD 90 patch 0   modafinil (PROVIGIL) 100 MG tablet Take 1 tablet (100 mg total) by mouth daily as needed. 10 tablet 3   pramipexole (MIRAPEX) 0.75 MG tablet Take 1 tablet (0.75 mg total) by mouth at bedtime. 90 tablet 1   QUEtiapine (SEROQUEL) 300 MG tablet Take 1 tablet (300 mg total) by mouth at bedtime. 90 tablet 1   tiZANidine (ZANAFLEX) 4 MG tablet Take 1 tablet (4 mg total) by mouth at bedtime as needed for muscle spasms. 30 tablet 0   QUEtiapine (SEROQUEL) 400 MG tablet TAKE 1 TABLET BY MOUTH AT BEDTIME 90 tablet  1   No current facility-administered medications on file prior to visit.     The following portions of the patient's history were reviewed and updated as appropriate: allergies, current medications, past family history, past medical history, past social history, past surgical history and problem list.  ROS Otherwise as in subjective above     Objective: BP 110/60   Pulse 85   Wt 193 lb 9.6 oz (87.8 kg)   BMI 24.20 kg/m   General appearance: alert, no distress, well developed, well nourished Skin: There are a few different skin findings including right lower leg medially and posteriorly with 2 cm roundish somewhat salmon-colored faint lesion that is not raised or rough, there are 2 similar larger yellowish to salmon-colored flat skin findings roundish approximately 2 cm diameter each on the mid right back, there are 2 pink-red lesions left and right lateral torso that are approximately less than 1 cm somewhat oval shaped that are somewhat rough     Assessment: Encounter Diagnoses  Name Primary?   Rash Yes   High risk medication use    Fibromyalgia    Other chronic pain      Plan: Skin lesions-unclear etiology, possible tinea but not 253% certain.  We discussed other options which could include to take psoriasis, fungal rash, other underlying autoimmune issue or other.  Begin trial of Diflucan and topical Lotrisone short-term.  Referral to dermatology   Chronic pain, fibromyalgia-referral to pain management.  He has seen pain management in [place] most recently but not in recent months.  He would like to somewhere closer to his home.  Given his prior high risk medication use, prior difficulty in controlling his symptoms and given the fact that his mother is my fellow employee, I do not feel comfortable addressing or managing any of his pain treatment  Carl Hughes was seen today for discuss skin issues.  Diagnoses and all orders for this visit:  Rash -     Ambulatory referral  to Dermatology  High risk medication use -     Ambulatory referral to Pain Clinic  Fibromyalgia -     Ambulatory referral to Pain Clinic  Other chronic pain -     Ambulatory referral to Pain Clinic  Other orders -     Discontinue: fluconazole (DIFLUCAN) 150 MG tablet; Take 1 tablet (150 mg total) by mouth once a week. -     Discontinue: clotrimazole-betamethasone (LOTRISONE) cream; Apply 1 application topically daily. -     fluconazole (DIFLUCAN) 150 MG tablet; Take 1 tablet (150 mg total) by mouth once a week. -     clotrimazole-betamethasone (LOTRISONE) cream; Apply 1  application topically daily.    Follow up: Pending referrals
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
## 2022-06-22 DIAGNOSIS — Z20822 Contact with and (suspected) exposure to covid-19: Secondary | ICD-10-CM | POA: Diagnosis not present

## 2022-06-22 DIAGNOSIS — S80812A Abrasion, left lower leg, initial encounter: Secondary | ICD-10-CM | POA: Diagnosis not present

## 2022-06-22 DIAGNOSIS — F1290 Cannabis use, unspecified, uncomplicated: Secondary | ICD-10-CM | POA: Diagnosis not present

## 2022-06-22 DIAGNOSIS — F319 Bipolar disorder, unspecified: Secondary | ICD-10-CM | POA: Diagnosis not present

## 2022-06-22 DIAGNOSIS — S80811A Abrasion, right lower leg, initial encounter: Secondary | ICD-10-CM | POA: Diagnosis not present

## 2022-06-22 LAB — COMPREHENSIVE METABOLIC PANEL
ALT: 39 U/L (ref 0–44)
AST: 41 U/L (ref 15–41)
Albumin: 4.4 g/dL (ref 3.5–5.0)
Alkaline Phosphatase: 70 U/L (ref 38–126)
Anion gap: 8 (ref 5–15)
BUN: 14 mg/dL (ref 6–20)
CO2: 24 mmol/L (ref 22–32)
Calcium: 9.7 mg/dL (ref 8.9–10.3)
Chloride: 109 mmol/L (ref 98–111)
Creatinine, Ser: 0.76 mg/dL (ref 0.61–1.24)
GFR, Estimated: >60 mL/min (ref >60)
Glucose, Bld: 79 mg/dL (ref 70–99)
Potassium: 3.7 mmol/L (ref 3.5–5.1)
Sodium: 141 mmol/L (ref 135–145)
Total Bilirubin: 0.4 mg/dL (ref 0.3–1.2)
Total Protein: 7 g/dL (ref 6.5–8.1)

## 2022-06-22 LAB — RESP PANEL BY RT-PCR (FLU A&B, COVID) ARPGX2
Influenza A by PCR: NEGATIVE
Influenza B by PCR: NEGATIVE
SARS Coronavirus 2 by RT PCR: NEGATIVE

## 2022-06-22 LAB — ETHANOL: Alcohol, Ethyl (B): 21 mg/dL — ABNORMAL HIGH (ref <10)

## 2022-06-22 MED ORDER — NICOTINE 21 MG/24HR TD PT24
21.0000 mg | MEDICATED_PATCH | Freq: Every day | TRANSDERMAL | Status: DC
  Filled 2022-06-22: qty 1

## 2022-06-22 MED ORDER — QUETIAPINE FUMARATE 200 MG PO TABS
300.0000 mg | ORAL_TABLET | Freq: Every day | ORAL | Status: DC
  Administered 2022-06-22: 300 mg via ORAL
  Filled 2022-06-22: qty 3

## 2022-06-22 MED ORDER — ALPRAZOLAM 0.5 MG PO TABS
1.0000 mg | ORAL_TABLET | Freq: Two times a day (BID) | ORAL | Status: DC
  Administered 2022-06-22 (×2): 1 mg via ORAL
  Filled 2022-06-22: qty 2
  Filled 2022-06-22: qty 4

## 2022-06-22 MED ORDER — ALPRAZOLAM ER 1 MG PO TB24: 2.0000 mg | ORAL_TABLET | Freq: Every day | ORAL | Status: DC

## 2022-06-22 MED ORDER — DULOXETINE HCL 60 MG PO CPEP
120.0000 mg | ORAL_CAPSULE | Freq: Every morning | ORAL | Status: DC
  Administered 2022-06-22: 120 mg via ORAL
  Filled 2022-06-22: qty 4
  Filled 2022-06-22: qty 2

## 2022-06-22 MED ORDER — OXYCODONE-ACETAMINOPHEN 5-325 MG PO TABS
1.0000 | ORAL_TABLET | Freq: Once | ORAL | Status: AC
  Administered 2022-06-22: 1 via ORAL
  Filled 2022-06-22: qty 1

## 2022-06-22 MED ORDER — PRAMIPEXOLE DIHYDROCHLORIDE 0.25 MG PO TABS
0.7500 mg | ORAL_TABLET | Freq: Once | ORAL | Status: AC
Start: 2022-06-22 — End: 2022-06-22
  Administered 2022-06-22: 0.75 mg via ORAL
  Filled 2022-06-22: qty 3

## 2022-06-22 MED ORDER — CLONIDINE HCL 0.2 MG PO TABS
0.2000 mg | ORAL_TABLET | Freq: Every day | ORAL | Status: DC
  Administered 2022-06-22: 0.2 mg via ORAL
  Filled 2022-06-22: qty 1

## 2022-06-22 MED ORDER — ALPRAZOLAM 0.5 MG PO TABS
0.5000 mg | ORAL_TABLET | Freq: Two times a day (BID) | ORAL | Status: DC | PRN
  Filled 2022-06-22: qty 2

## 2022-06-22 MED ORDER — PRAMIPEXOLE DIHYDROCHLORIDE 0.25 MG PO TABS
0.7500 mg | ORAL_TABLET | Freq: Every day | ORAL | Status: DC
Start: 2022-06-22 — End: 2022-06-22

## 2022-06-22 NOTE — Discharge Instructions (Addendum)
It was our pleasure to provide your ER care today - we hope that you feel better.  Avoid drug use as is harmful to your physical health and mental well-being.  See resources provided where you may seek substance use treatment - both outpatient and residential programs are included.  See additional resource guide provided in terms of other behavioral health and social service resources.   Follow up closely with behavioral health specialist and primary care doctor in the next 1-2 weeks.   For mental health issues and/or crisis, you may also go directly to the [facility] - it is open 24/7 and walk-ins are welcome.  Return to ER if worse, new symptoms, chest pain, trouble breathing, or other emergency concern.   You were given pain medication in the ER - no driving for the next 6 hours.

## 2022-06-22 NOTE — ED Provider Notes (Signed)
Emergency Medicine Observation Re-evaluation Note  Carl Hughes is a 31 y.o. male, seen on rounds today.  Pt initially presented to the ED for complaints of behavioral health evaluation.  Pt indicates he feels improved today overall. He indicates the past couple days were stressful, as he was having difficulty obtaining 'good weed'.  He denies any thoughts of harm to self or others. Normal appetite. He does note chronic headaches from head injury a few years ago - states daily headaches - no acute change. Indicates takes meds for same on daily basis.   Physical Exam  BP 112/82 (BP Location: Right Arm)   Pulse 73   Temp (!) 97.4 F (36.3 C) (Oral)   Resp 16   SpO2 96%  Physical Exam General: alert, conversant, cooperative. Cardiac: regular rate. Lungs: breathing comfortably. Psych: pt with normal mood and affect. Pt does not appear to be acutely depressed or despondent. He does not appear angry or hostile. He denies any thoughts of harm to self or others. Pt does not appear to be responding to internal stimuli - no delusions or hallucinations are noted.   ED Course / MDM   I have reviewed the labs performed to date as well as medications administered while in observation.  Recent changes in the last 24 hours include ED obs, reassessment.  Plan  Percocet 1 po   Pt comfortable, no distress. Pt currently does not appear acutely psychotic. Pt denies any thoughts of harm to self or others.  Encourage to f/u closely with pcp and bh provider.   Return precautions provided.    Lajean Saver, MD 06/23/22 1049
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
## 2022-06-22 NOTE — ED Notes (Signed)
[employer] on scene to tx pt.

## 2022-06-22 NOTE — ED Notes (Signed)
IVC paperwork completed in [place] with Network engineer

## 2022-06-22 NOTE — ED Notes (Signed)
One bag of belongings given to [place].

## 2022-06-22 NOTE — ED Notes (Signed)
Pt currently talking with TTS
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
## 2022-06-22 NOTE — ED Provider Notes (Deleted)
Emergency Medicine Observation Re-evaluation Note  JOEVON HOLLIMAN is a 31 y.o. male, seen on rounds today.  Pt initially presented to the ED for complaints of Medical Clearance Currently, the patient is asleep.  Physical Exam  BP 116/82 (BP Location: Left Arm)   Pulse 79   Temp 98.4 F (36.9 C) (Oral)   Resp 16   SpO2 95%  Physical Exam General: asleep Cardiac: not assessed, asleep Lungs: not assessed, asleep Psych: not assessed, asleep  ED Course / MDM  EKG:   I have reviewed the labs performed to date as well as medications administered while in observation.  Recent changes in the last 24 hours include home meds being ordered.  Plan  Current plan is for reevaluation by psychiatry this morning. Secundino I Perris is under involuntary commitment.      Sherwood Gambler, MD 06/22/22 [phone]

## 2022-06-23 ENCOUNTER — Telehealth: Payer: Self-pay

## 2022-06-23 NOTE — Telephone Encounter (Signed)
Transition Care Management Unsuccessful Follow-up Telephone Call  Date of discharge and from where:  06/22/2022 from [facility]  Attempts:  1st Attempt  Reason for unsuccessful TCM follow-up call:  Voice mail full

## 2022-06-28 NOTE — Telephone Encounter (Signed)
Transition Care Management Unsuccessful Follow-up Telephone Call  Date of discharge and from where:  06/22/2022 from [facility]  Attempts:  2nd Attempt  Reason for unsuccessful TCM follow-up call:  Voice mail full

## 2022-06-29 ENCOUNTER — Institutional Professional Consult (permissible substitution): Payer: 59 | Admitting: Medical

## 2022-06-29 NOTE — Telephone Encounter (Signed)
Transition Care Management Unsuccessful Follow-up Telephone Call  Date of discharge and from where:  06/22/2022 from [facility]  Attempts:  3rd Attempt  Reason for unsuccessful TCM follow-up call:  Unable to reach patient

## 2022-07-02 ENCOUNTER — Other Ambulatory Visit (HOSPITAL_COMMUNITY): Payer: Self-pay

## 2022-07-07 ENCOUNTER — Other Ambulatory Visit (HOSPITAL_COMMUNITY): Payer: Self-pay

## 2022-07-07 DIAGNOSIS — F4312 Post-traumatic stress disorder, chronic: Secondary | ICD-10-CM | POA: Diagnosis not present

## 2022-07-07 DIAGNOSIS — F3181 Bipolar II disorder: Secondary | ICD-10-CM | POA: Diagnosis not present

## 2022-07-07 DIAGNOSIS — F332 Major depressive disorder, recurrent severe without psychotic features: Secondary | ICD-10-CM | POA: Diagnosis not present

## 2022-07-07 MED ORDER — PREGABALIN 75 MG PO CAPS
75.0000 mg | ORAL_CAPSULE | Freq: Two times a day (BID) | ORAL | 1 refills | Status: DC
  Filled 2022-07-07: qty 60, 30d supply, fill #0
  Filled 2022-07-30: qty 60, 30d supply, fill #1

## 2022-07-09 DIAGNOSIS — Z419 Encounter for procedure for purposes other than remedying health state, unspecified: Secondary | ICD-10-CM | POA: Diagnosis not present

## 2022-07-13 ENCOUNTER — Other Ambulatory Visit (HOSPITAL_COMMUNITY): Payer: Self-pay

## 2022-07-14 ENCOUNTER — Encounter: Payer: Self-pay | Admitting: Internal Medicine

## 2022-07-23 ENCOUNTER — Other Ambulatory Visit (HOSPITAL_COMMUNITY): Payer: Self-pay

## 2022-07-30 ENCOUNTER — Other Ambulatory Visit (HOSPITAL_COMMUNITY): Payer: Self-pay

## 2022-08-02 ENCOUNTER — Other Ambulatory Visit (HOSPITAL_COMMUNITY): Payer: Self-pay

## 2022-08-08 DIAGNOSIS — Z419 Encounter for procedure for purposes other than remedying health state, unspecified: Secondary | ICD-10-CM | POA: Diagnosis not present

## 2022-08-10 ENCOUNTER — Ambulatory Visit (HOSPITAL_COMMUNITY)
Admission: EM | Admit: 2022-08-10 | Discharge: 2022-08-10 | Disposition: A | Discharge status: Home / Self Care | Payer: 59 | Attending: Nurse Practitioner | Admitting: Nurse Practitioner

## 2022-08-10 ENCOUNTER — Ambulatory Visit (HOSPITAL_COMMUNITY)
Admission: AD | Admit: 2022-08-10 | Discharge: 2022-08-10 | Disposition: A | Discharge status: Home / Self Care | Payer: 59 | Attending: Psychiatry | Admitting: Psychiatry

## 2022-08-10 DIAGNOSIS — F411 Generalized anxiety disorder: Secondary | ICD-10-CM | POA: Diagnosis not present

## 2022-08-10 DIAGNOSIS — Z765 Malingerer [conscious simulation]: Secondary | ICD-10-CM

## 2022-08-10 MED ORDER — CLONIDINE HCL 0.1 MG PO TABS
0.2000 mg | ORAL_TABLET | Freq: Once | ORAL | Status: AC
  Administered 2022-08-10: 0.2 mg via ORAL
  Filled 2022-08-10: qty 2

## 2022-08-10 MED ORDER — CARVEDILOL 3.125 MG PO TABS
3.1250 mg | ORAL_TABLET | Freq: Once | ORAL | Status: DC
  Filled 2022-08-10: qty 1

## 2022-08-10 MED ORDER — HYDROXYZINE HCL 25 MG PO TABS
25.0000 mg | ORAL_TABLET | Freq: Once | ORAL | Status: AC
  Administered 2022-08-10: 25 mg via ORAL
  Filled 2022-08-10: qty 1

## 2022-08-10 NOTE — Progress Notes (Signed)
Writer entered resources into AVS  [facility]

## 2022-08-10 NOTE — Progress Notes (Addendum)
Patient denied access to guns. Patient calm and cooperative during assessment.   08/10/22 2104  [place] Triage Screening (Walk-ins at [facility] only)  How Did You Hear About [place]? Family/Friend  What Is the Reason for Your Visit/Call Today? Carl Hughes is a 31 year old male presenting voluntary to [facility] requesting medications to calm down. Patient was accompanied by mother, Markas Aldredge, consent received from patient for her to be present during assessment. Patient denied SI and HI. Patient reported sometimes hearing voices, however no command voices to harm self or others. Patient is currently seeing Dr Reece Levy for medication management and was last seen in 06/2022. Patient reported being released from jail last night after being locked up for 43 days due to assault.  How Long Has This Been Causing You Problems? <Week  Have You Recently Had Any Thoughts About Hurting Yourself? No  Are You Planning to Commit Suicide/Harm Yourself At This time? No  Have you Recently Had Thoughts About [place]? No  Are You Planning To Harm Someone At This Time? No  Are you currently experiencing any auditory, visual or other hallucinations? No  Have You Used Any Alcohol or Drugs in the Past 24 Hours? No  Do you have any current medical co-morbidities that require immediate attention? No  Clinician description of patient physical appearance/behavior: neat / cooperative  What Do You Feel Would Help You the Most Today? Medication(s)  If access to [facility] was not available, would you have sought care in the Emergency Department? No  Determination of Need Routine (7 days)  Options For Referral Outpatient Therapy;Medication Management

## 2022-08-10 NOTE — ED Notes (Addendum)
Discharge instructions reviewed and questions entertained and Mr Carl Hughes currently denies current suicidal or homacidal ideation and was able to verbally contract for safety. Pt heart rate was rechecked and decreased to 100 beats /min

## 2022-08-10 NOTE — Discharge Instructions (Signed)
  Discharge recommendations:  Patient is to take medications as prescribed. Please see information for follow-up appointment with psychiatry and therapy. Please follow up with your primary care provider for all medical related needs.   Therapy: We recommend that patient participate in individual therapy to address mental health concerns.  Medications: The parent/guardian is to contact a medical professional and/or outpatient provider to address any new side effects that develop. Parent/guardian should update outpatient providers of any new medications and/or medication changes.   Atypical antipsychotics: If you are prescribed an atypical antipsychotic, it is recommended that your height, weight, BMI, blood pressure, fasting lipid panel, and fasting blood sugar be monitored by your outpatient providers.  Safety:  The patient should abstain from use of illicit substances/drugs and abuse of any medications. If symptoms worsen or do not continue to improve or if the patient becomes actively suicidal or homicidal then it is recommended that the patient return to the closest hospital emergency department, the [facility], or call 911 for further evaluation and treatment. National Suicide Prevention Lifeline 1-800-SUICIDE or 609-187-6414.  About 988 988 offers 24/7 access to trained crisis counselors who can help people experiencing mental health-related distress. People can call or text 988 or chat 988lifeline.org for themselves or if they are worried about a loved one who may need crisis support.  Crisis Mobile: [facility]:                     620-715-0293 (for crisis response 24 hours a day) [place]:                                            [phone]

## 2022-08-10 NOTE — ED Provider Notes (Incomplete)
Behavioral Health Urgent Care Medical Screening Exam  Patient Name: Carl Hughes MRN: [id number] Date of Evaluation: 08/10/22 Chief Complaint:   Diagnosis:  Final diagnoses:  Generalized anxiety disorder    History of Present illness: Carl Hughes is a 32 y.o. male is a 31 year old male with past psychiatric history of aggressive behavior, acute stress reaction because of mixed disturbance of emotion and conduct, marijuana use, bipolar 2 disorder, PTSD, GAD, and MDD without psychotic features who presented as a walk-in to [facility] accompanied by his mother and requesting prescription for diazepam and Valium.  Patient reports he just got out of jail last night after spending 43 days for assaulting a police officer because he was under the influence of drugs. Patient reports he has been on prescription benzodiazepines for years due to his panic attacks and generalized anxiety disorder.  Patient reports he had a big panic attack 72 days ago, and barely slept last night after he got home because he did not have his usual benzodiazepine (diazepam )which he always takes at bedtime for the past 16 years. Pt reports he was on 5 mg of Xanax a day at one point as prescribed by his psychiatrist.  Patient reports he had 2 mg of Xanax after he got out of jail.  Patient reports he was given 2 weeks worth of benzos while in jail, and then they stopped.  Patient reports he sees Dr. Reece Levy at [facility], and last saw this provider at the end of July before he went to jail. Patient denies current drug use.  The patient reports his sleep is poor because of lack of benzodiazepine, which he usually takes at bedtime. Patient reports while in jail he was given Cymbalta, Seroquel for 2 weeks and then the stopped, clonidine for his tachycardia/HTN.  Patient reports his current sleep and appetite as fair. Patient denies suicidal ideation, denies homicidal ideation, denies audiovisual hallucinations. Patient  reports he lives at home with his mom.  Mom reports presence of a gun at home, however it is secured/ locked up.   Patient is requesting prescriptions for the diazepam and Xanax.  Collateral information was obtained from the patient's mom Hanger Mickel Baas, who remained emotional during this evaluation.  Ms. Butterfield reports the main reason they came to the [facility] today is because a month ago, the patient had a mental health breakdown, which was really bad 1 night  Psychiatric Specialty Exam  Presentation  General Appearance:Casual  Eye Contact:Good  Speech:Clear and Coherent  Speech Volume:Normal  Handedness:Right   Mood and Affect  Mood: Euthymic  Affect: Congruent   Thought Process  Thought Processes: Coherent  Descriptions of Associations:Intact  Orientation:Full (Time, Place and Person)  Thought Content:WDL  Diagnosis of Schizophrenia or Schizoaffective disorder in past: No   Hallucinations:None  Ideas of Reference:None  Suicidal Thoughts:No  Homicidal Thoughts:No   Sensorium  Memory: Immediate Fair  Judgment: Fair  Insight: Fair   Community education officer  Concentration: Fair  Attention Span: Fair  Recall: [employer] of Knowledge: Fair  Language: Fair   Psychomotor Activity  Psychomotor Activity: Normal   Assets  Assets: Armed forces logistics/support/administrative officer; Desire for Improvement   Sleep  Sleep: Fair  Number of hours: No data recorded  Nutritional Assessment (For OBS and FBC admissions only) Has the patient had a weight loss or gain of 10 pounds or more in the last 3 months?: No Has the patient had a decrease in food intake/or appetite?: No Does the patient have dental problems?:  No Does the patient have eating habits or behaviors that may be indicators of an eating disorder including binging or inducing vomiting?: No Has the patient recently lost weight without trying?: 0 Has the patient been eating poorly because of a decreased appetite?:  0 Malnutrition Screening Tool Score: 0    Physical Exam: Physical Exam Constitutional:      General: He is not in acute distress.    Appearance: He is normal weight. He is not diaphoretic.  HENT:     Head: Normocephalic.     Right Ear: External ear normal.     Left Ear: External ear normal.     Nose: No congestion.  Eyes:     General:        Right eye: No discharge.        Left eye: No discharge.  Cardiovascular:     Rate and Rhythm: Tachycardia present.  Pulmonary:     Effort: No respiratory distress.  Chest:     Chest wall: No tenderness.  Neurological:     Mental Status: He is alert and oriented to person, place, and time.  Psychiatric:        Attention and Perception: Attention and perception normal.        Mood and Affect: Mood and affect normal.        Speech: Speech normal.        Behavior: Behavior is cooperative.        Thought Content: Thought content normal. Thought content is not paranoid. Thought content does not include homicidal or suicidal ideation. Thought content does not include homicidal or suicidal plan.        Cognition and Memory: Cognition and memory normal.        Judgment: Judgment normal.    Review of Systems  Constitutional:  Negative for chills, diaphoresis and fever.  HENT:  Negative for congestion.   Eyes:  Negative for discharge.  Respiratory:  Negative for cough, shortness of breath and wheezing.   Cardiovascular:  Negative for chest pain and palpitations.  Gastrointestinal:  Negative for diarrhea, nausea and vomiting.  Neurological:  Negative for dizziness, seizures, loss of consciousness, weakness and headaches.  Psychiatric/Behavioral:  Negative for depression, hallucinations, substance abuse and suicidal ideas. The patient is not nervous/anxious.    Blood pressure 114/75, pulse (!) 159, temperature 98.1 F (36.7 C), resp. rate 20, SpO2 95 %. There is no height or weight on file to calculate BMI.  Musculoskeletal: Strength &  Muscle Tone: {desc; muscle tone:32375} Gait & Station: {PE GAIT ED NATL:22525} Patient leans: {Patient Leans:21022755}   [place] MSE Discharge Disposition for Follow up and Recommendations: {BHUC MSE Recommendations:24277}   Shastina Rua Carolan Shiver, NP 08/10/2022, 9:47 PM

## 2022-08-10 NOTE — ED Provider Notes (Incomplete)
Behavioral Health Urgent Care Medical Screening Exam  Patient Name: Carl Hughes MRN: [id number] Date of Evaluation: 08/10/22 Chief Complaint:   Diagnosis:  Final diagnoses:  Generalized anxiety disorder    History of Present illness: Carl Hughes is a 31 y.o. male. ***  Psychiatric Specialty Exam  Presentation  General Appearance:Casual  Eye Contact:Good  Speech:Clear and Coherent  Speech Volume:Normal  Handedness:Right   Mood and Affect  Mood: Euthymic  Affect: Congruent   Thought Process  Thought Processes: Coherent  Descriptions of Associations:Intact  Orientation:Full (Time, Place and Person)  Thought Content:WDL  Diagnosis of Schizophrenia or Schizoaffective disorder in past: No   Hallucinations:None  Ideas of Reference:None  Suicidal Thoughts:No  Homicidal Thoughts:No   Sensorium  Memory: Immediate Fair  Judgment: Fair  Insight: Fair   Community education officer  Concentration: Fair  Attention Span: Fair  Recall: [employer] of Knowledge: Fair  Language: Fair   Psychomotor Activity  Psychomotor Activity: Normal   Assets  Assets: Armed forces logistics/support/administrative officer; Desire for Improvement   Sleep  Sleep: Fair  Number of hours: No data recorded  Nutritional Assessment (For OBS and FBC admissions only) Has the patient had a weight loss or gain of 10 pounds or more in the last 3 months?: No Has the patient had a decrease in food intake/or appetite?: No Does the patient have dental problems?: No Does the patient have eating habits or behaviors that may be indicators of an eating disorder including binging or inducing vomiting?: No Has the patient recently lost weight without trying?: 0 Has the patient been eating poorly because of a decreased appetite?: 0 Malnutrition Screening Tool Score: 0    Physical Exam: Physical Exam Constitutional:      General: He is not in acute distress.    Appearance: He is normal weight. He is not  diaphoretic.  HENT:     Head: Normocephalic.     Right Ear: External ear normal.     Left Ear: External ear normal.     Nose: No congestion.  Eyes:     General:        Right eye: No discharge.        Left eye: No discharge.  Cardiovascular:     Rate and Rhythm: Tachycardia present.  Pulmonary:     Effort: No respiratory distress.  Chest:     Chest wall: No tenderness.  Neurological:     Mental Status: He is alert and oriented to person, place, and time.  Psychiatric:        Attention and Perception: Attention and perception normal.        Mood and Affect: Mood and affect normal.        Speech: Speech normal.        Behavior: Behavior is cooperative.        Thought Content: Thought content normal. Thought content is not paranoid. Thought content does not include homicidal or suicidal ideation. Thought content does not include homicidal or suicidal plan.        Cognition and Memory: Cognition and memory normal.        Judgment: Judgment normal.    Review of Systems  Constitutional:  Negative for chills, diaphoresis and fever.  HENT:  Negative for congestion.   Eyes:  Negative for discharge.  Respiratory:  Negative for cough, shortness of breath and wheezing.   Cardiovascular:  Negative for chest pain and palpitations.  Gastrointestinal:  Negative for diarrhea, nausea and vomiting.  Neurological:  Negative  for dizziness, seizures, loss of consciousness, weakness and headaches.  Psychiatric/Behavioral:  Negative for depression, hallucinations, substance abuse and suicidal ideas. The patient is not nervous/anxious.    Blood pressure 114/75, pulse (!) 159, temperature 98.1 F (36.7 C), resp. rate 20, SpO2 95 %. There is no height or weight on file to calculate BMI.  Musculoskeletal: Strength & Muscle Tone: {desc; muscle tone:32375} Gait & Station: {PE GAIT ED NATL:22525} Patient leans: {Patient Leans:[id number]}   [place] MSE Discharge Disposition for Follow up and  Recommendations: {BHUC MSE Recommendations:24277}   Carl Reggio Carolan Shiver, NP 08/10/2022, 9:47 PM

## 2022-08-11 DIAGNOSIS — Z765 Malingerer [conscious simulation]: Secondary | ICD-10-CM | POA: Insufficient documentation

## 2022-08-11 NOTE — H&P (Signed)
Behavioral Health Medical Screening Exam  Carl Hughes is a 31 y.o. male.  With a history of bipolar disorder, general anxiety disorder, major depressive disorder, aggressive behavior.  Presented to [facility] accompanied by his mother Carl Hughes.  Recent record show patient was just seen and discharge from [place].    Per the mother the patient just got released from jail on 08/09/22 and she does not want him in his house until he is stabilized on all his medicines. When asked of the patient if he was taking his medicine while in jail patient stated he was taking some of his medicine, he was given xanax and diazepam for 2 weeks then they stop giving it to him.  According to the patient the jail has put him on olanzapine and  2 other medicines but he could not remember the name.  According to the patient he currently takes Seroquel when asked when was the last time he took his Seroquel he said he took it this morning.  Patient mother stated that 2 dose of the medicine is not going to help he needs to be on it for a while, NP discussed with patient and his mother that patient needs to resume his medicines and also contact his psychiatrist to let them know he is now out of jail and so the psychiatrist can refill his xanax and diazepam medication.  According to the patient he see Carl Hughes at [facility].   When the [place] pdmp (control substance) report was checked it show that patient medication for xanax 2 mg and xanax 0.5 mg a 30 day supply was picked up from the pharmacy on 08/02/22.  This is during the time patient was in jail.  Also diaZepam 10 mg a 30 day supply was also picked up. (However according to the patient he was out of these medication when he go home from jail yesterday).   NP discuss with patient mother to talk with the patient psychiatry about filling is xanax,  mother reply,  the psychiatrist is busy and he not going to see them.   Patient mother is very adamant that she does not want  him at home, I discussed with patient mother that at this time the patient is very cooperative and not aggressive not displaying any psychotic behaviors, and the patient denies any suicidal ideation at this time and therefore there is no criteria for inpatient admission.  NP also discussed with mother that if she wants he could transfer the patient to [facility].however patient mother did not want that because she stated they not going to keep him there long. Pt mother very displease and stated I will just then.  NP consulted with on-call psychiatrist Carl. De Nurse Hughes, and presented the case to the Hughes, and conclusion there was no immediate need for admission.  Face-to-face observation of patient, patient is alert and oriented x 4, speech is clear, maintaining eye contact.  Mood is anxious affect congruent with mood.  Patient is dressed casually answer all questions appropriately patient does not display any aggressive behavior, patient does not appear to be psychotic at this moment.  Patient does not seem to be influenced by external or internal stimuli.  Patient denies SI, HI, AVH or paranoia. Pt denies current alcohol or drugs use.  According to patient he last took medicines today however he didn't get his xanax or diazepam,  so he need them refill.  When asked when was the last time he got medicine  when he was in jail patient said that last night right before he was released.    Pt got up from the interview room and went to the other room,  NP asked patient what was wrong pt state I took my medications except my xanax and diazepam because I don't have any, my mother just don't want me to stay with her (she is the one that don't want me there).  Mother came to the room and patient stop talking.   NP discussed with patient and his mother to follow-up with their outpatient psychiatrist.  Pt mother story seem to be conflicting with patient story,  during the conversation patient mother stated that patient was out  of his medication because they were last filled in June,  however the [place] pdmp shows that they were picked up from the pharmacy 08/02/22.    Discussed with patient and his mother that we could refer the patient to Carl Hughes, ED for overnight observation patient mother stated now that not all the work she does not want that.  Patient mother stated that she is ready to go and she needs to leave.      Total Time spent with patient: 20 minutes  Psychiatric Specialty Exam:  Presentation  General Appearance:  Casual  Eye Contact: Good  Speech: Clear and Coherent  Speech Volume: Normal  Handedness: Right   Mood and Affect  Mood: Euthymic  Affect: Congruent   Thought Process  Thought Processes: Coherent  Descriptions of Associations:Intact  Orientation:Full (Time, Place and Person)  Thought Content:WDL  History of Schizophrenia/Schizoaffective disorder:No  Duration of Psychotic Symptoms:No data recorded Hallucinations:Hallucinations: None  Ideas of Reference:None  Suicidal Thoughts:Suicidal Thoughts: No  Homicidal Thoughts:Homicidal Thoughts: No   Sensorium  Memory: Immediate Fair  Judgment: Fair  Insight: Fair   Community education officer  Concentration: Fair  Attention Span: Fair  Recall: [employer] of Knowledge: Fair  Language: Fair   Psychomotor Activity  Psychomotor Activity: Psychomotor Activity: Normal   Assets  Assets: Communication Skills; Desire for Improvement   Sleep  Sleep: Sleep: Fair    Physical Exam: Physical Exam HENT:     Head: Normocephalic.     Nose: Nose normal.  Cardiovascular:     Rate and Rhythm: Normal rate.  Pulmonary:     Effort: Pulmonary effort is normal.  Musculoskeletal:        General: Normal range of motion.     Cervical back: Normal range of motion.  Neurological:     General: No focal deficit present.     Mental Status: He is alert.  Psychiatric:        Mood and Affect: Mood  normal.    Review of Systems  Constitutional: Negative.   HENT: Negative.    Eyes: Negative.   Respiratory: Negative.    Cardiovascular: Negative.   Gastrointestinal: Negative.   Genitourinary: Negative.   Musculoskeletal: Negative.   Skin: Negative.   Neurological: Negative.   Endo/Heme/Allergies: Negative.   Psychiatric/Behavioral:  Positive for depression. The patient is nervous/anxious.    Blood pressure 105/82, pulse (!) 114, temperature 97.8 F (36.6 C), resp. rate 18, SpO2 95 %. There is no height or weight on file to calculate BMI.  Musculoskeletal: Strength & Muscle Tone: within normal limits Gait & Station: normal Patient leans: N/A  [place] Scale:  Flowsheet Row OP Visit from 08/10/2022 in [place] Admission (Discharged) from 09/13/2018 in [facility] ED from 09/12/2018 in  [place] EMERGENCY DEPARTMENT  C-SSRS RISK CATEGORY No Risk No Risk High Risk       Recommendations:  Based on my evaluation the patient does not appear to have an emergency medical condition.  Evette Georges, NP 08/11/2022, 12:49 AM

## 2022-08-13 DIAGNOSIS — Z79899 Other long term (current) drug therapy: Secondary | ICD-10-CM | POA: Diagnosis not present

## 2022-08-13 DIAGNOSIS — R000 Tachycardia, unspecified: Secondary | ICD-10-CM | POA: Diagnosis not present

## 2022-08-13 DIAGNOSIS — R45851 Suicidal ideations: Secondary | ICD-10-CM | POA: Diagnosis not present

## 2022-08-13 DIAGNOSIS — F419 Anxiety disorder, unspecified: Secondary | ICD-10-CM | POA: Diagnosis not present

## 2022-08-13 DIAGNOSIS — R443 Hallucinations, unspecified: Secondary | ICD-10-CM | POA: Diagnosis not present

## 2022-08-13 DIAGNOSIS — Z888 Allergy status to other drugs, medicaments and biological substances status: Secondary | ICD-10-CM | POA: Diagnosis not present

## 2022-08-13 DIAGNOSIS — Z886 Allergy status to analgesic agent status: Secondary | ICD-10-CM | POA: Diagnosis not present

## 2022-08-13 DIAGNOSIS — F32A Depression, unspecified: Secondary | ICD-10-CM | POA: Diagnosis not present

## 2022-08-14 DIAGNOSIS — Z79899 Other long term (current) drug therapy: Secondary | ICD-10-CM | POA: Diagnosis not present

## 2022-08-14 DIAGNOSIS — F1110 Opioid abuse, uncomplicated: Secondary | ICD-10-CM | POA: Diagnosis not present

## 2022-08-14 DIAGNOSIS — R45851 Suicidal ideations: Secondary | ICD-10-CM | POA: Diagnosis not present

## 2022-08-14 DIAGNOSIS — F1210 Cannabis abuse, uncomplicated: Secondary | ICD-10-CM | POA: Diagnosis not present

## 2022-08-14 DIAGNOSIS — F1310 Sedative, hypnotic or anxiolytic abuse, uncomplicated: Secondary | ICD-10-CM | POA: Diagnosis not present

## 2022-08-14 DIAGNOSIS — F1994 Other psychoactive substance use, unspecified with psychoactive substance-induced mood disorder: Secondary | ICD-10-CM | POA: Diagnosis not present

## 2022-08-14 DIAGNOSIS — M545 Low back pain, unspecified: Secondary | ICD-10-CM | POA: Diagnosis not present

## 2022-08-14 DIAGNOSIS — F315 Bipolar disorder, current episode depressed, severe, with psychotic features: Secondary | ICD-10-CM | POA: Diagnosis not present

## 2022-08-14 DIAGNOSIS — G8929 Other chronic pain: Secondary | ICD-10-CM | POA: Diagnosis not present

## 2022-08-14 DIAGNOSIS — F17210 Nicotine dependence, cigarettes, uncomplicated: Secondary | ICD-10-CM | POA: Diagnosis not present

## 2022-08-14 DIAGNOSIS — F1220 Cannabis dependence, uncomplicated: Secondary | ICD-10-CM | POA: Diagnosis not present

## 2022-08-14 DIAGNOSIS — F1120 Opioid dependence, uncomplicated: Secondary | ICD-10-CM | POA: Diagnosis not present

## 2022-08-14 DIAGNOSIS — Z886 Allergy status to analgesic agent status: Secondary | ICD-10-CM | POA: Diagnosis not present

## 2022-08-14 DIAGNOSIS — Z888 Allergy status to other drugs, medicaments and biological substances status: Secondary | ICD-10-CM | POA: Diagnosis not present

## 2022-08-14 DIAGNOSIS — F411 Generalized anxiety disorder: Secondary | ICD-10-CM | POA: Diagnosis not present

## 2022-08-14 DIAGNOSIS — F1320 Sedative, hypnotic or anxiolytic dependence, uncomplicated: Secondary | ICD-10-CM | POA: Diagnosis not present

## 2022-08-14 DIAGNOSIS — F419 Anxiety disorder, unspecified: Secondary | ICD-10-CM | POA: Diagnosis not present

## 2022-08-14 DIAGNOSIS — F32A Depression, unspecified: Secondary | ICD-10-CM | POA: Diagnosis not present

## 2022-08-17 ENCOUNTER — Encounter: Payer: Self-pay | Admitting: Internal Medicine

## 2022-08-26 DIAGNOSIS — M797 Fibromyalgia: Secondary | ICD-10-CM | POA: Diagnosis not present

## 2022-08-26 DIAGNOSIS — M542 Cervicalgia: Secondary | ICD-10-CM | POA: Diagnosis not present

## 2022-08-26 DIAGNOSIS — G8929 Other chronic pain: Secondary | ICD-10-CM | POA: Diagnosis not present

## 2022-08-26 DIAGNOSIS — G44329 Chronic post-traumatic headache, not intractable: Secondary | ICD-10-CM | POA: Diagnosis not present

## 2022-08-26 DIAGNOSIS — M546 Pain in thoracic spine: Secondary | ICD-10-CM | POA: Diagnosis not present

## 2022-08-26 DIAGNOSIS — G894 Chronic pain syndrome: Secondary | ICD-10-CM | POA: Diagnosis not present

## 2022-08-26 DIAGNOSIS — Z79891 Long term (current) use of opiate analgesic: Secondary | ICD-10-CM | POA: Diagnosis not present

## 2022-08-27 ENCOUNTER — Other Ambulatory Visit (HOSPITAL_COMMUNITY): Payer: Self-pay

## 2022-08-27 MED ORDER — OXYCODONE-ACETAMINOPHEN 5-325 MG PO TABS
1.0000 | ORAL_TABLET | Freq: Every day | ORAL | 0 refills | Status: DC
  Filled 2022-08-27: qty 30, 30d supply, fill #0

## 2022-08-27 MED ORDER — METHYLPREDNISOLONE 4 MG PO TBPK
ORAL_TABLET | ORAL | 0 refills | Status: DC
  Filled 2022-08-27: qty 21, 6d supply, fill #0

## 2022-08-31 ENCOUNTER — Other Ambulatory Visit (HOSPITAL_COMMUNITY): Payer: Self-pay

## 2022-09-01 ENCOUNTER — Other Ambulatory Visit (HOSPITAL_COMMUNITY): Payer: Self-pay

## 2022-09-08 DIAGNOSIS — Z419 Encounter for procedure for purposes other than remedying health state, unspecified: Secondary | ICD-10-CM | POA: Diagnosis not present

## 2022-09-14 ENCOUNTER — Other Ambulatory Visit (HOSPITAL_COMMUNITY): Payer: Self-pay

## 2022-09-14 DIAGNOSIS — F4312 Post-traumatic stress disorder, chronic: Secondary | ICD-10-CM | POA: Diagnosis not present

## 2022-09-14 DIAGNOSIS — F3181 Bipolar II disorder: Secondary | ICD-10-CM | POA: Diagnosis not present

## 2022-09-14 MED ORDER — ALPRAZOLAM 0.5 MG PO TABS
0.5000 mg | ORAL_TABLET | Freq: Two times a day (BID) | ORAL | 0 refills | Status: DC | PRN
  Filled 2022-09-29: qty 60, 30d supply, fill #0
  Filled 2022-11-02: qty 60, 30d supply, fill #1
  Filled 2022-12-02: qty 60, 30d supply, fill #2

## 2022-09-14 MED ORDER — QUETIAPINE FUMARATE 300 MG PO TABS
300.0000 mg | ORAL_TABLET | Freq: Every day | ORAL | 0 refills | Status: DC
  Filled 2022-09-14 – 2022-11-22 (×3): qty 90, 90d supply, fill #0

## 2022-09-14 MED ORDER — ALPRAZOLAM ER 2 MG PO TB24
2.0000 mg | ORAL_TABLET | Freq: Every day | ORAL | 0 refills | Status: DC
  Filled 2022-09-14: qty 90, 90d supply, fill #0
  Filled 2022-09-29: qty 30, 30d supply, fill #0
  Filled 2022-11-02: qty 25, 25d supply, fill #1
  Filled 2022-11-02: qty 5, 5d supply, fill #1
  Filled 2022-11-29: qty 30, 30d supply, fill #2

## 2022-09-14 MED ORDER — DULOXETINE HCL 60 MG PO CPEP
120.0000 mg | ORAL_CAPSULE | Freq: Every morning | ORAL | 0 refills | Status: DC
  Filled 2022-09-14 – 2022-11-02 (×2): qty 180, 90d supply, fill #0

## 2022-09-15 ENCOUNTER — Other Ambulatory Visit (HOSPITAL_COMMUNITY): Payer: Self-pay

## 2022-09-20 ENCOUNTER — Other Ambulatory Visit: Payer: Self-pay | Admitting: Anesthesiology

## 2022-09-20 ENCOUNTER — Ambulatory Visit
Admission: RE | Admit: 2022-09-20 | Discharge: 2022-09-20 | Disposition: A | Discharge status: Home / Self Care | Payer: 59 | Source: Ambulatory Visit | Attending: Anesthesiology | Admitting: Anesthesiology

## 2022-09-20 DIAGNOSIS — M542 Cervicalgia: Secondary | ICD-10-CM

## 2022-09-20 DIAGNOSIS — M5030 Other cervical disc degeneration, unspecified cervical region: Secondary | ICD-10-CM

## 2022-09-20 DIAGNOSIS — M5489 Other dorsalgia: Secondary | ICD-10-CM

## 2022-09-20 DIAGNOSIS — M545 Low back pain, unspecified: Secondary | ICD-10-CM | POA: Diagnosis not present

## 2022-09-24 ENCOUNTER — Other Ambulatory Visit (HOSPITAL_COMMUNITY): Payer: Self-pay

## 2022-09-24 DIAGNOSIS — G44329 Chronic post-traumatic headache, not intractable: Secondary | ICD-10-CM | POA: Diagnosis not present

## 2022-09-24 DIAGNOSIS — Z79891 Long term (current) use of opiate analgesic: Secondary | ICD-10-CM | POA: Diagnosis not present

## 2022-09-24 DIAGNOSIS — M797 Fibromyalgia: Secondary | ICD-10-CM | POA: Diagnosis not present

## 2022-09-24 DIAGNOSIS — G894 Chronic pain syndrome: Secondary | ICD-10-CM | POA: Diagnosis not present

## 2022-09-24 DIAGNOSIS — M542 Cervicalgia: Secondary | ICD-10-CM | POA: Diagnosis not present

## 2022-09-24 DIAGNOSIS — M546 Pain in thoracic spine: Secondary | ICD-10-CM | POA: Diagnosis not present

## 2022-09-24 DIAGNOSIS — G8929 Other chronic pain: Secondary | ICD-10-CM | POA: Diagnosis not present

## 2022-09-27 ENCOUNTER — Other Ambulatory Visit (HOSPITAL_COMMUNITY): Payer: Self-pay

## 2022-09-28 ENCOUNTER — Other Ambulatory Visit (HOSPITAL_COMMUNITY): Payer: Self-pay

## 2022-09-28 MED ORDER — OXYCODONE-ACETAMINOPHEN 5-325 MG PO TABS
1.0000 | ORAL_TABLET | Freq: Two times a day (BID) | ORAL | 0 refills | Status: DC
  Filled 2022-09-28: qty 60, 30d supply, fill #0

## 2022-09-29 ENCOUNTER — Other Ambulatory Visit (HOSPITAL_COMMUNITY): Payer: Self-pay

## 2022-10-01 ENCOUNTER — Other Ambulatory Visit (HOSPITAL_COMMUNITY): Payer: Self-pay

## 2022-10-27 DIAGNOSIS — G894 Chronic pain syndrome: Secondary | ICD-10-CM | POA: Diagnosis not present

## 2022-10-27 DIAGNOSIS — Z79891 Long term (current) use of opiate analgesic: Secondary | ICD-10-CM | POA: Diagnosis not present

## 2022-10-28 ENCOUNTER — Other Ambulatory Visit (HOSPITAL_COMMUNITY): Payer: Self-pay

## 2022-10-28 MED ORDER — OXYCODONE-ACETAMINOPHEN 5-325 MG PO TABS
1.0000 | ORAL_TABLET | Freq: Two times a day (BID) | ORAL | 0 refills | Status: DC
  Filled 2022-10-28: qty 60, 30d supply, fill #0

## 2022-11-02 ENCOUNTER — Other Ambulatory Visit (HOSPITAL_COMMUNITY): Payer: Self-pay

## 2022-11-02 ENCOUNTER — Other Ambulatory Visit (HOSPITAL_BASED_OUTPATIENT_CLINIC_OR_DEPARTMENT_OTHER): Payer: Self-pay

## 2022-11-02 ENCOUNTER — Other Ambulatory Visit: Payer: Self-pay

## 2022-11-03 ENCOUNTER — Other Ambulatory Visit (HOSPITAL_COMMUNITY): Payer: Self-pay

## 2022-11-04 ENCOUNTER — Other Ambulatory Visit (HOSPITAL_COMMUNITY): Payer: Self-pay

## 2022-11-22 ENCOUNTER — Other Ambulatory Visit (HOSPITAL_COMMUNITY): Payer: Self-pay

## 2022-11-24 ENCOUNTER — Other Ambulatory Visit (HOSPITAL_COMMUNITY): Payer: Self-pay

## 2022-11-24 DIAGNOSIS — G894 Chronic pain syndrome: Secondary | ICD-10-CM | POA: Diagnosis not present

## 2022-11-24 DIAGNOSIS — M797 Fibromyalgia: Secondary | ICD-10-CM | POA: Diagnosis not present

## 2022-11-24 DIAGNOSIS — Z79891 Long term (current) use of opiate analgesic: Secondary | ICD-10-CM | POA: Diagnosis not present

## 2022-11-24 DIAGNOSIS — G44329 Chronic post-traumatic headache, not intractable: Secondary | ICD-10-CM | POA: Diagnosis not present

## 2022-11-24 DIAGNOSIS — M542 Cervicalgia: Secondary | ICD-10-CM | POA: Diagnosis not present

## 2022-11-24 DIAGNOSIS — G8929 Other chronic pain: Secondary | ICD-10-CM | POA: Diagnosis not present

## 2022-11-24 DIAGNOSIS — M546 Pain in thoracic spine: Secondary | ICD-10-CM | POA: Diagnosis not present

## 2022-11-24 MED ORDER — OXYCODONE-ACETAMINOPHEN 5-325 MG PO TABS
1.0000 | ORAL_TABLET | Freq: Three times a day (TID) | ORAL | 0 refills | Status: DC
  Filled 2022-11-24 – 2022-11-26 (×2): qty 90, 30d supply, fill #0

## 2022-11-26 ENCOUNTER — Other Ambulatory Visit (HOSPITAL_COMMUNITY): Payer: Self-pay

## 2022-11-26 MED ORDER — DIAZEPAM 10 MG PO TABS
10.0000 mg | ORAL_TABLET | Freq: Every day | ORAL | 0 refills | Status: DC
  Filled 2022-11-26: qty 5, 5d supply, fill #0

## 2022-11-29 ENCOUNTER — Other Ambulatory Visit (HOSPITAL_COMMUNITY): Payer: Self-pay

## 2022-12-02 ENCOUNTER — Other Ambulatory Visit (HOSPITAL_COMMUNITY): Payer: Self-pay

## 2022-12-08 ENCOUNTER — Other Ambulatory Visit (HOSPITAL_COMMUNITY): Payer: Self-pay

## 2022-12-14 ENCOUNTER — Other Ambulatory Visit (HOSPITAL_COMMUNITY): Payer: Self-pay

## 2022-12-23 ENCOUNTER — Other Ambulatory Visit (HOSPITAL_COMMUNITY): Payer: Self-pay

## 2022-12-24 ENCOUNTER — Other Ambulatory Visit (HOSPITAL_COMMUNITY): Payer: Self-pay

## 2022-12-28 ENCOUNTER — Other Ambulatory Visit (HOSPITAL_COMMUNITY): Payer: Self-pay

## 2022-12-29 ENCOUNTER — Other Ambulatory Visit (HOSPITAL_COMMUNITY): Payer: Self-pay

## 2022-12-29 MED ORDER — QUETIAPINE FUMARATE 300 MG PO TABS
300.0000 mg | ORAL_TABLET | Freq: Every day | ORAL | 0 refills | Status: DC
  Filled 2022-12-29 – 2023-02-22 (×2): qty 90, 90d supply, fill #0

## 2022-12-30 ENCOUNTER — Other Ambulatory Visit (HOSPITAL_COMMUNITY): Payer: Self-pay

## 2022-12-30 MED ORDER — ALPRAZOLAM 0.5 MG PO TABS
0.5000 mg | ORAL_TABLET | Freq: Two times a day (BID) | ORAL | 0 refills | Status: DC | PRN
  Filled 2022-12-30: qty 60, 30d supply, fill #0
  Filled 2023-01-28: qty 60, 30d supply, fill #1
  Filled 2023-02-22: qty 60, 30d supply, fill #2

## 2022-12-30 MED ORDER — ALPRAZOLAM ER 2 MG PO TB24
2.0000 mg | ORAL_TABLET | Freq: Every day | ORAL | 0 refills | Status: DC
  Filled 2022-12-30: qty 30, 30d supply, fill #0
  Filled 2023-01-28: qty 30, 30d supply, fill #1
  Filled 2023-02-25: qty 30, 30d supply, fill #2

## 2023-01-07 ENCOUNTER — Other Ambulatory Visit (HOSPITAL_COMMUNITY): Payer: Self-pay

## 2023-01-07 DIAGNOSIS — M546 Pain in thoracic spine: Secondary | ICD-10-CM | POA: Diagnosis not present

## 2023-01-07 DIAGNOSIS — M542 Cervicalgia: Secondary | ICD-10-CM | POA: Diagnosis not present

## 2023-01-07 DIAGNOSIS — G8929 Other chronic pain: Secondary | ICD-10-CM | POA: Diagnosis not present

## 2023-01-07 DIAGNOSIS — Z79891 Long term (current) use of opiate analgesic: Secondary | ICD-10-CM | POA: Diagnosis not present

## 2023-01-07 DIAGNOSIS — G44329 Chronic post-traumatic headache, not intractable: Secondary | ICD-10-CM | POA: Diagnosis not present

## 2023-01-07 DIAGNOSIS — G894 Chronic pain syndrome: Secondary | ICD-10-CM | POA: Diagnosis not present

## 2023-01-07 DIAGNOSIS — M797 Fibromyalgia: Secondary | ICD-10-CM | POA: Diagnosis not present

## 2023-01-10 ENCOUNTER — Other Ambulatory Visit (HOSPITAL_COMMUNITY): Payer: Self-pay

## 2023-01-10 MED ORDER — OXYCODONE-ACETAMINOPHEN 5-325 MG PO TABS
1.0000 | ORAL_TABLET | Freq: Three times a day (TID) | ORAL | 0 refills | Status: DC
  Filled 2023-01-10 (×2): qty 90, 30d supply, fill #0

## 2023-01-11 ENCOUNTER — Other Ambulatory Visit (HOSPITAL_COMMUNITY): Payer: Self-pay

## 2023-01-23 ENCOUNTER — Telehealth: Payer: Commercial Managed Care - PPO | Admitting: Physician Assistant

## 2023-01-23 ENCOUNTER — Encounter: Payer: Self-pay | Admitting: Physician Assistant

## 2023-01-23 DIAGNOSIS — J349 Unspecified disorder of nose and nasal sinuses: Secondary | ICD-10-CM

## 2023-01-23 DIAGNOSIS — J340 Abscess, furuncle and carbuncle of nose: Secondary | ICD-10-CM

## 2023-01-23 MED ORDER — MUPIROCIN 2 % EX OINT: 1.0000 | TOPICAL_OINTMENT | Freq: Two times a day (BID) | CUTANEOUS | 0 refills | Status: DC

## 2023-01-23 MED ORDER — CEPHALEXIN 500 MG PO CAPS: 500.0000 mg | ORAL_CAPSULE | Freq: Four times a day (QID) | ORAL | 0 refills | Status: DC

## 2023-01-23 NOTE — Progress Notes (Signed)
Virtual Visit Consent   [place], you are scheduled for a virtual visit with a [facility] provider today. Just as with appointments in the office, your consent must be obtained to participate. Your consent will be active for this visit and any virtual visit you may have with one of our providers in the next 365 days. If you have a MyChart account, a copy of this consent can be sent to you electronically.  As this is a virtual visit, video technology does not allow for your provider to perform a traditional examination. This may limit your provider's ability to fully assess your condition. If your provider identifies any concerns that need to be evaluated in person or the need to arrange testing (such as labs, EKG, etc.), we will make arrangements to do so. Although advances in technology are sophisticated, we cannot ensure that it will always work on either your end or our end. If the connection with a video visit is poor, the visit may have to be switched to a telephone visit. With either a video or telephone visit, we are not always able to ensure that we have a secure connection.  By engaging in this virtual visit, you consent to the provision of healthcare and authorize for your insurance to be billed (if applicable) for the services provided during this visit. Depending on your insurance coverage, you may receive a charge related to this service.  I need to obtain your verbal consent now. Are you willing to proceed with your visit today? Zalman I Dahms has provided verbal consent on 01/23/2023 for a virtual visit (video or telephone). Mar Daring, PA-C  Date: 01/23/2023 5:14 PM  Virtual Visit via Video Note   I, Mar Daring, connected with  Carl Hughes  ([id number], [birth year]) on 01/23/23 at  5:00 PM EDT by a video-enabled telemedicine application and verified that I am speaking with the correct person using two identifiers.  Location: Patient: Virtual Visit Location Patient:  Home Provider: Virtual Visit Location Provider: Home Office   I discussed the limitations of evaluation and management by telemedicine and the availability of in person appointments. The patient expressed understanding and agreed to proceed.    History of Present Illness: Carl Hughes is a 32 y.o. who identifies as a male who was assigned male at birth, and is being seen today for skin infection on bottom of left nostril. Has been trying mupirocin on the area. Now feels like it is about the size of a pea. Did have some purulent discharge today. It is TTP. Does feel like he can move it around. Denies fevers, chills, nausea, or vomiting. Had rhinorrhea prior to onset. Denies any known injury. Not obstructing oxygen on the side.    Problems:  Patient Active Problem List   Diagnosis Date Noted   Malingering 08/11/2022   Generalized anxiety disorder 01/27/2022   Fibromyalgia 01/27/2022   GERD (gastroesophageal reflux disease) 01/27/2022   Pupil irregular of left eye 11/17/2020   Acute nonintractable headache 11/17/2020   Concussion with no loss of consciousness 11/17/2020   Livedo reticularis 09/08/2020   Family history of autoimmune disorder 09/08/2020   Meralgia paresthetica 09/08/2020   Paresthesia 09/08/2020   High risk medication use 09/08/2020   Syncope [id number]   Renal colic on left side [id number]   Renal stone 08/01/2019   Hyperhidrosis of feet 07/19/2018   Metatarsalgia of left foot 07/19/2018   Major depressive disorder 04/04/2014   Insomnia 01/30/2014  Bipolar disorder, unspecified 01/30/2014   Marijuana abuse, continuous 09/02/2013   Benzodiazepine abuse/dependence 09/02/2013   Alcohol abuse 09/02/2013   Opiate abuse, episodic 09/02/2013   Substance induced mood disorder 09/02/2013   Tobacco use disorder 08/18/2011   IBS (irritable bowel syndrome) 08/18/2011    Allergies:  Allergies  Allergen Reactions   Zofran [Ondansetron Hcl] Rash   Buspar [Buspirone Hcl]  Hives   Ibuprofen [Ibuprofen] Hives   Lamictal [Lamotrigine]     Unknown reaction   Minipress [Prazosin]     Unknown reaction   Toradol [Ketorolac Tromethamine]     Dry mouth and headache   Medications:  Current Outpatient Medications:    ALPRAZolam (XANAX XR) 2 MG 24 hr tablet, Take 1 tablet (2 mg total) by mouth at bedtime., Disp: 90 tablet, Rfl: 1   ALPRAZolam (XANAX XR) 2 MG 24 hr tablet, Take 1 tablet (2 mg total) by mouth at bedtime., Disp: 90 tablet, Rfl: 0   ALPRAZolam (XANAX) 0.5 MG tablet, Take 1 tablet (0.5 mg total) by mouth 2 (two) times daily as needed. (Patient taking differently: Take 0.5 mg by mouth 2 (two) times daily as needed for anxiety.), Disp: 180 tablet, Rfl: 1   ALPRAZolam (XANAX) 0.5 MG tablet, Take 1 tablet (0.5 mg total) by mouth 2 (two) times daily as needed., Disp: 180 tablet, Rfl: 0   cephALEXin (KEFLEX) 500 MG capsule, Take 1 capsule (500 mg total) by mouth 4 (four) times daily., Disp: 20 capsule, Rfl: 0   cloNIDine (CATAPRES) 0.2 MG tablet, Take 1 tablet (0.2 mg total) by mouth at bedtime., Disp: 90 tablet, Rfl: 1   clotrimazole-betamethasone (LOTRISONE) cream, Apply 1 application topically daily., Disp: 45 g, Rfl: 0   cyclobenzaprine (FLEXERIL) 10 MG tablet, Take 1 tablet (10 mg total) by mouth at bedtime., Disp: 90 tablet, Rfl: 1   diazepam (VALIUM) 10 MG tablet, Take 1 tablet (10 mg total) by mouth at bedtime as needed. (Patient taking differently: Take 10 mg by mouth at bedtime as needed (for severe anxiety).), Disp: 90 tablet, Rfl: 1   diazepam (VALIUM) 10 MG tablet, Take 1 tablet (10 mg total) by mouth at bedtime., Disp: 5 tablet, Rfl: 0   DULoxetine (CYMBALTA) 60 MG capsule, Take 2 capsules (120 mg total) by mouth in the morning., Disp: 180 capsule, Rfl: 0   fluconazole (DIFLUCAN) 150 MG tablet, Take 1 tablet (150 mg total) by mouth once a week. (Patient not taking: Reported on 06/22/2022), Disp: 4 tablet, Rfl: 0   hydrOXYzine (VISTARIL) 25 MG capsule,  Take 1 capsule (25 mg total) by mouth 2 (two) times daily as needed for itching, Disp: 30 capsule, Rfl: 1   lidocaine (LIDODERM) 5 %, Place 1 patch onto the skin daily. Remove & Discard patch within 12 hours or as directed by MD, Disp: 90 patch, Rfl: 0   methylPREDNISolone (MEDROL) 4 MG TBPK tablet, Use as directed, Disp: 21 tablet, Rfl: 0   modafinil (PROVIGIL) 100 MG tablet, Take 1 tablet (100 mg total) by mouth daily as needed. (Patient not taking: Reported on 06/22/2022), Disp: 10 tablet, Rfl: 3   mupirocin ointment (BACTROBAN) 2 %, Apply 1 Application topically 2 (two) times daily., Disp: 22 g, Rfl: 0   oxyCODONE-acetaminophen (PERCOCET) 5-325 MG tablet, Take 1 tablet by mouth every 8 (eight) hours., Disp: 90 tablet, Rfl: 0   pramipexole (MIRAPEX) 0.75 MG tablet, Take 1 tablet (0.75 mg total) by mouth at bedtime., Disp: 90 tablet, Rfl: 1   pregabalin (LYRICA) 75  MG capsule, Take 1 capsule (75 mg total) by mouth 2 (two) times daily., Disp: 60 capsule, Rfl: 1   QUEtiapine (SEROQUEL) 300 MG tablet, Take 1 tablet (300 mg total) by mouth at bedtime., Disp: 90 tablet, Rfl: 0   tiZANidine (ZANAFLEX) 4 MG tablet, Take 1 tablet (4 mg total) by mouth at bedtime as needed for muscle spasms., Disp: 30 tablet, Rfl: 0  Observations/Objective: Patient is well-developed, well-nourished in no acute distress.  Resting comfortably at home.  Head is normocephalic, atraumatic.  No labored breathing.  Speech is clear and coherent with logical content.  Patient is alert and oriented at baseline.  See photos in media tab  Assessment and Plan: 1. Nasal abscess - mupirocin ointment (BACTROBAN) 2 %; Apply 1 Application topically 2 (two) times daily.  Dispense: 22 g; Refill: 0 - cephALEXin (KEFLEX) 500 MG capsule; Take 1 capsule (500 mg total) by mouth 4 (four) times daily.  Dispense: 20 capsule; Refill: 0  - Suspect nose infection with cellulitis - Keflex added - Continue Mupirocin - Warm compresses - Tylenol  as needed - Avoid picking and scratching - Seek in person evaluation if continues to worsen  Follow Up Instructions: I discussed the assessment and treatment plan with the patient. The patient was provided an opportunity to ask questions and all were answered. The patient agreed with the plan and demonstrated an understanding of the instructions.  A copy of instructions were sent to the patient via MyChart unless otherwise noted below.    The patient was advised to call back or seek an in-person evaluation if the symptoms worsen or if the condition fails to improve as anticipated.  Time:  I spent 10 minutes with the patient via telehealth technology discussing the above problems/concerns.    Mar Daring, PA-C

## 2023-01-23 NOTE — Patient Instructions (Signed)
[place], thank you for joining Mar Daring, PA-C for today's virtual visit.  While this provider is not your primary care provider (PCP), if your PCP is located in our provider database this encounter information will be shared with them immediately following your visit.   [facility] account gives you access to today's visit and all your visits, tests, and labs performed at [facility] " click here if you don't have a [facility] account or go to [email or web address]  Consent: (Patient) Carl Hughes provided verbal consent for this virtual visit at the beginning of the encounter.  Current Medications:  Current Outpatient Medications:    ALPRAZolam (XANAX XR) 2 MG 24 hr tablet, Take 1 tablet (2 mg total) by mouth at bedtime., Disp: 90 tablet, Rfl: 1   ALPRAZolam (XANAX XR) 2 MG 24 hr tablet, Take 1 tablet (2 mg total) by mouth at bedtime., Disp: 90 tablet, Rfl: 0   ALPRAZolam (XANAX) 0.5 MG tablet, Take 1 tablet (0.5 mg total) by mouth 2 (two) times daily as needed. (Patient taking differently: Take 0.5 mg by mouth 2 (two) times daily as needed for anxiety.), Disp: 180 tablet, Rfl: 1   ALPRAZolam (XANAX) 0.5 MG tablet, Take 1 tablet (0.5 mg total) by mouth 2 (two) times daily as needed., Disp: 180 tablet, Rfl: 0   cephALEXin (KEFLEX) 500 MG capsule, Take 1 capsule (500 mg total) by mouth 4 (four) times daily., Disp: 20 capsule, Rfl: 0   cloNIDine (CATAPRES) 0.2 MG tablet, Take 1 tablet (0.2 mg total) by mouth at bedtime., Disp: 90 tablet, Rfl: 1   clotrimazole-betamethasone (LOTRISONE) cream, Apply 1 application topically daily., Disp: 45 g, Rfl: 0   cyclobenzaprine (FLEXERIL) 10 MG tablet, Take 1 tablet (10 mg total) by mouth at bedtime., Disp: 90 tablet, Rfl: 1   diazepam (VALIUM) 10 MG tablet, Take 1 tablet (10 mg total) by mouth at bedtime as needed. (Patient taking differently: Take 10 mg by mouth at bedtime as needed (for severe anxiety).),  Disp: 90 tablet, Rfl: 1   diazepam (VALIUM) 10 MG tablet, Take 1 tablet (10 mg total) by mouth at bedtime., Disp: 5 tablet, Rfl: 0   DULoxetine (CYMBALTA) 60 MG capsule, Take 2 capsules (120 mg total) by mouth in the morning., Disp: 180 capsule, Rfl: 0   fluconazole (DIFLUCAN) 150 MG tablet, Take 1 tablet (150 mg total) by mouth once a week. (Patient not taking: Reported on 06/22/2022), Disp: 4 tablet, Rfl: 0   hydrOXYzine (VISTARIL) 25 MG capsule, Take 1 capsule (25 mg total) by mouth 2 (two) times daily as needed for itching, Disp: 30 capsule, Rfl: 1   lidocaine (LIDODERM) 5 %, Place 1 patch onto the skin daily. Remove & Discard patch within 12 hours or as directed by MD, Disp: 90 patch, Rfl: 0   methylPREDNISolone (MEDROL) 4 MG TBPK tablet, Use as directed, Disp: 21 tablet, Rfl: 0   modafinil (PROVIGIL) 100 MG tablet, Take 1 tablet (100 mg total) by mouth daily as needed. (Patient not taking: Reported on 06/22/2022), Disp: 10 tablet, Rfl: 3   mupirocin ointment (BACTROBAN) 2 %, Apply 1 Application topically 2 (two) times daily., Disp: 22 g, Rfl: 0   oxyCODONE-acetaminophen (PERCOCET) 5-325 MG tablet, Take 1 tablet by mouth every 8 (eight) hours., Disp: 90 tablet, Rfl: 0   pramipexole (MIRAPEX) 0.75 MG tablet, Take 1 tablet (0.75 mg total) by mouth at bedtime., Disp: 90 tablet, Rfl: 1   pregabalin (LYRICA) 75  MG capsule, Take 1 capsule (75 mg total) by mouth 2 (two) times daily., Disp: 60 capsule, Rfl: 1   QUEtiapine (SEROQUEL) 300 MG tablet, Take 1 tablet (300 mg total) by mouth at bedtime., Disp: 90 tablet, Rfl: 0   tiZANidine (ZANAFLEX) 4 MG tablet, Take 1 tablet (4 mg total) by mouth at bedtime as needed for muscle spasms., Disp: 30 tablet, Rfl: 0   Medications ordered in this encounter:  Meds ordered this encounter  Medications   mupirocin ointment (BACTROBAN) 2 %    Sig: Apply 1 Application topically 2 (two) times daily.    Dispense:  22 g    Refill:  0    Order Specific Question:    Supervising Provider    Answer:   Chase Picket [id number]   cephALEXin (KEFLEX) 500 MG capsule    Sig: Take 1 capsule (500 mg total) by mouth 4 (four) times daily.    Dispense:  20 capsule    Refill:  0    Order Specific Question:   Supervising Provider    Answer:   Chase Picket [id number]     *If you need refills on other medications prior to your next appointment, please contact your pharmacy*  Follow-Up: Call back or seek an in-person evaluation if the symptoms worsen or if the condition fails to improve as anticipated.  [facility] [phone]  Other Instructions  Nasal Abscess  A nasal abscess is an infection that causes pus to build up between the bone or cartilage that separates one side of the nose from the other (nasal septum). The front part of the nasal septum is cartilage, and the back part is bone. A nasal abscess causes pain and stuffiness (nasal congestion). It can affect one or both sides of the nose. This condition is dangerous because the infection can spread to the brain or the eye. It can also cause the septum to collapse and result in a deformed nose (saddle nose). Treatment for a nasal abscess needs to start as soon as the diagnosis is made. What are the causes? This condition is caused by an infection from bacteria. A common type of skin bacteria (staphylococcus) is the most common cause, but other bacteria may also cause the infection. A nasal abscess may result from: An injury to the nasal septum that causes bleeding between the covering layer of the septal cartilage or the bony septum (septal hematoma). Bacteria from inside the nose can enter the hematoma and cause the infection that leads to an abscess. An object inserted into the nose (foreign body). An infection that spreads to the septum from another area of the body, such as a sinus infection, dental infection, or an infected hair follicle in the nose (furuncle). What increases the  risk? You are more likely to develop this condition if: You have diabetes. Your body's defense system (immune system) is weak. You have nasal surgery. What are the signs or symptoms? Symptoms of this condition include: Not being able to breathe through your nose (nasal obstruction). This is the main symptom. Nose pain. Swelling and redness of the outer nose. Pain when touching the nose (tenderness). Fever. Headache. How is this diagnosed? This condition may be diagnosed based on: Your symptoms and medical history. A physical exam. During the exam, your health care provider will use an instrument (nasal speculum) to look inside your nose and check for a soft red or blue swelling that is blocking one or both sides of your  nose. Tests, such as: CT scans of the nose and sinuses. Blood tests to check for signs of infection. A lab test on a sample of pus to check what kind of bacteria is causing the infection and determine what antibiotic medicine will be most effective (culture and sensitivity test). How is this treated? Treatment needs to start right away to prevent complications. Treatment may include: Antibiotic medicine given through an IV inserted into one of your veins. You may be given antibiotics that are effective against staphylococcus and other common bacteria (broad spectrum antibiotics). You may also get a combination of antibiotics. Antibiotics to be taken by mouth (orally). You may be sent home with this medicine after the abscess is drained and the infection is controlled with IV antibiotics. Your antibiotic may be switched if the results of your culture and sensitivity test indicate another antibiotic would be more effective. A procedure to drain the abscess surgically (incision and drainage) and test the pus for bacteria. The pus may be sent for a culture and sensitivity test. You may have a type of bandage (packing) placed in your nose after the abscess is drained. Follow  these instructions at home:  Take your antibiotic medicine as told by your health care provider. Do not stop taking the antibiotic even if you start to feel better. Take over-the-counter and prescription medicines only as told by your health care provider. Ask your health care provider if the medicine prescribed to you requires you to avoid driving or using machinery. Return to your normal activities as told by your health care provider. Ask your health care provider what activities are safe for you. If you have a nasal packing, ask your health care provider when you should come back to have the packing removed. Keep all follow-up visits. This is important. Contact a health care provider if: You have chills or a fever. You have any signs or symptoms of the nasal abscess coming back. You have any blood or pus draining from your nose. Your nose becomes deformed. Get help right away if: You have a severe headache, stiff neck, or eye pain. You have a sudden change in vision. Summary A nasal abscess is an infection that causes a collection of pus near the bone or cartilage that separates one side of the nose from the other (nasal septum). A nasal abscess may result from an injury to the septum that caused bleeding that collected (hematoma) and became infected. Common symptoms of this condition include nasal pain and not being able to breathe through your nose. Treatment needs to be started right away to prevent complications. Treatment may include IV antibiotics as well as incision and drainage. Get help right away if you have a severe headache, stiff neck, eye pain, or a sudden change in vision. This information is not intended to replace advice given to you by your health care provider. Make sure you discuss any questions you have with your health care provider. Document Revised: 10/14/2021 Document Reviewed: 10/14/2021 Elsevier Patient Education  [place].    If you have been  instructed to have an in-person evaluation today at a local Urgent Care facility, please use the link below. It will take you to a list of all of our available [facility], including address, phone number and hours of operation. Please do not delay care.  [facility]  If you or a family member do not have a primary care provider, use the link below to schedule  a visit and establish care. When you choose a [facility] primary care physician or advanced practice provider, you gain a long-term partner in health. Find a Primary Care Provider  Learn more about [facility] in-office and virtual care options: [facility]

## 2023-01-28 ENCOUNTER — Other Ambulatory Visit (HOSPITAL_COMMUNITY): Payer: Self-pay

## 2023-01-28 DIAGNOSIS — M542 Cervicalgia: Secondary | ICD-10-CM | POA: Diagnosis not present

## 2023-01-28 DIAGNOSIS — M546 Pain in thoracic spine: Secondary | ICD-10-CM | POA: Diagnosis not present

## 2023-01-28 DIAGNOSIS — G8929 Other chronic pain: Secondary | ICD-10-CM | POA: Diagnosis not present

## 2023-01-28 DIAGNOSIS — G44329 Chronic post-traumatic headache, not intractable: Secondary | ICD-10-CM | POA: Diagnosis not present

## 2023-01-28 DIAGNOSIS — M797 Fibromyalgia: Secondary | ICD-10-CM | POA: Diagnosis not present

## 2023-01-28 DIAGNOSIS — Z79891 Long term (current) use of opiate analgesic: Secondary | ICD-10-CM | POA: Diagnosis not present

## 2023-01-28 DIAGNOSIS — G894 Chronic pain syndrome: Secondary | ICD-10-CM | POA: Diagnosis not present

## 2023-01-28 MED ORDER — OXYCODONE-ACETAMINOPHEN 5-325 MG PO TABS
1.0000 | ORAL_TABLET | Freq: Three times a day (TID) | ORAL | 0 refills | Status: DC
  Filled 2023-01-28 – 2023-02-08 (×2): qty 90, 30d supply, fill #0

## 2023-01-29 ENCOUNTER — Other Ambulatory Visit (HOSPITAL_COMMUNITY): Payer: Self-pay

## 2023-01-31 ENCOUNTER — Other Ambulatory Visit (HOSPITAL_COMMUNITY): Payer: Self-pay

## 2023-02-03 ENCOUNTER — Other Ambulatory Visit (HOSPITAL_COMMUNITY): Payer: Self-pay

## 2023-02-08 ENCOUNTER — Other Ambulatory Visit (HOSPITAL_COMMUNITY): Payer: Self-pay

## 2023-02-10 ENCOUNTER — Other Ambulatory Visit (HOSPITAL_COMMUNITY): Payer: Self-pay

## 2023-02-22 ENCOUNTER — Other Ambulatory Visit: Payer: Self-pay

## 2023-02-22 ENCOUNTER — Other Ambulatory Visit (HOSPITAL_COMMUNITY): Payer: Self-pay

## 2023-02-22 MED ORDER — DULOXETINE HCL 60 MG PO CPEP
120.0000 mg | ORAL_CAPSULE | Freq: Every morning | ORAL | 0 refills | Status: DC
  Filled 2023-02-22: qty 180, 90d supply, fill #0

## 2023-02-23 ENCOUNTER — Other Ambulatory Visit: Payer: Self-pay

## 2023-02-24 ENCOUNTER — Other Ambulatory Visit (HOSPITAL_COMMUNITY): Payer: Self-pay

## 2023-02-25 ENCOUNTER — Other Ambulatory Visit (HOSPITAL_COMMUNITY): Payer: Self-pay

## 2023-02-28 ENCOUNTER — Other Ambulatory Visit (HOSPITAL_COMMUNITY): Payer: Self-pay

## 2023-03-02 ENCOUNTER — Other Ambulatory Visit (HOSPITAL_COMMUNITY): Payer: Self-pay

## 2023-03-03 ENCOUNTER — Other Ambulatory Visit (HOSPITAL_COMMUNITY): Payer: Self-pay

## 2023-03-03 DIAGNOSIS — Z79891 Long term (current) use of opiate analgesic: Secondary | ICD-10-CM | POA: Diagnosis not present

## 2023-03-03 DIAGNOSIS — G894 Chronic pain syndrome: Secondary | ICD-10-CM | POA: Diagnosis not present

## 2023-03-03 DIAGNOSIS — M797 Fibromyalgia: Secondary | ICD-10-CM | POA: Diagnosis not present

## 2023-03-03 DIAGNOSIS — M542 Cervicalgia: Secondary | ICD-10-CM | POA: Diagnosis not present

## 2023-03-03 DIAGNOSIS — M546 Pain in thoracic spine: Secondary | ICD-10-CM | POA: Diagnosis not present

## 2023-03-03 DIAGNOSIS — G8929 Other chronic pain: Secondary | ICD-10-CM | POA: Diagnosis not present

## 2023-03-03 DIAGNOSIS — G44329 Chronic post-traumatic headache, not intractable: Secondary | ICD-10-CM | POA: Diagnosis not present

## 2023-03-03 MED ORDER — OXYCODONE-ACETAMINOPHEN 5-325 MG PO TABS
1.0000 | ORAL_TABLET | Freq: Four times a day (QID) | ORAL | 0 refills | Status: DC
  Filled 2023-03-03: qty 120, 30d supply, fill #0

## 2023-03-03 MED ORDER — DICLOFENAC SODIUM 75 MG PO TBEC
75.0000 mg | DELAYED_RELEASE_TABLET | Freq: Two times a day (BID) | ORAL | 0 refills | Status: DC
  Filled 2023-03-03: qty 60, 30d supply, fill #0

## 2023-03-04 ENCOUNTER — Other Ambulatory Visit (HOSPITAL_COMMUNITY): Payer: Self-pay

## 2023-03-07 ENCOUNTER — Other Ambulatory Visit (HOSPITAL_COMMUNITY): Payer: Self-pay

## 2023-03-08 ENCOUNTER — Other Ambulatory Visit (HOSPITAL_COMMUNITY): Payer: Self-pay

## 2023-03-08 DIAGNOSIS — F3181 Bipolar II disorder: Secondary | ICD-10-CM | POA: Diagnosis not present

## 2023-03-08 DIAGNOSIS — F4312 Post-traumatic stress disorder, chronic: Secondary | ICD-10-CM | POA: Diagnosis not present

## 2023-03-08 MED ORDER — ALPRAZOLAM 0.5 MG PO TABS
0.5000 mg | ORAL_TABLET | Freq: Two times a day (BID) | ORAL | 0 refills | Status: DC | PRN
  Filled 2023-03-08: qty 180, 90d supply, fill #0
  Filled 2023-03-25: qty 60, 30d supply, fill #0
  Filled 2023-04-22: qty 60, 30d supply, fill #1
  Filled 2023-05-24: qty 60, 30d supply, fill #2

## 2023-03-08 MED ORDER — CLONIDINE HCL 0.2 MG PO TABS
0.2000 mg | ORAL_TABLET | Freq: Every evening | ORAL | 0 refills | Status: DC
  Filled 2023-03-08: qty 90, 90d supply, fill #0

## 2023-03-08 MED ORDER — ALPRAZOLAM ER 2 MG PO TB24
2.0000 mg | ORAL_TABLET | Freq: Every day | ORAL | 0 refills | Status: DC
  Filled 2023-03-08: qty 90, 90d supply, fill #0
  Filled 2023-03-25: qty 30, 30d supply, fill #0
  Filled 2023-03-25: qty 90, 90d supply, fill #0
  Filled 2023-03-28: qty 30, 30d supply, fill #0
  Filled 2023-04-26: qty 30, 30d supply, fill #1
  Filled 2023-05-24: qty 30, 30d supply, fill #2

## 2023-03-08 MED ORDER — DULOXETINE HCL 60 MG PO CPEP
120.0000 mg | ORAL_CAPSULE | Freq: Every morning | ORAL | 0 refills | Status: DC
  Filled 2023-03-08 – 2023-06-09 (×2): qty 180, 90d supply, fill #0

## 2023-03-08 MED ORDER — QUETIAPINE FUMARATE 300 MG PO TABS
300.0000 mg | ORAL_TABLET | Freq: Every day | ORAL | 0 refills | Status: DC
  Filled 2023-03-08 – 2023-06-09 (×2): qty 90, 90d supply, fill #0

## 2023-03-11 ENCOUNTER — Other Ambulatory Visit (HOSPITAL_COMMUNITY): Payer: Self-pay

## 2023-03-14 ENCOUNTER — Other Ambulatory Visit (HOSPITAL_COMMUNITY): Payer: Self-pay

## 2023-03-14 MED ORDER — CLONIDINE HCL 0.2 MG PO TABS
0.2000 mg | ORAL_TABLET | Freq: Every day | ORAL | 0 refills | Status: DC
  Filled 2023-03-14: qty 90, 90d supply, fill #0

## 2023-03-23 ENCOUNTER — Other Ambulatory Visit (HOSPITAL_COMMUNITY): Payer: Self-pay

## 2023-03-25 ENCOUNTER — Other Ambulatory Visit (HOSPITAL_COMMUNITY): Payer: Self-pay

## 2023-03-28 ENCOUNTER — Other Ambulatory Visit (HOSPITAL_COMMUNITY): Payer: Self-pay

## 2023-03-29 ENCOUNTER — Other Ambulatory Visit (HOSPITAL_COMMUNITY): Payer: Self-pay

## 2023-03-31 DIAGNOSIS — M792 Neuralgia and neuritis, unspecified: Secondary | ICD-10-CM | POA: Diagnosis not present

## 2023-03-31 DIAGNOSIS — Z79891 Long term (current) use of opiate analgesic: Secondary | ICD-10-CM | POA: Diagnosis not present

## 2023-03-31 DIAGNOSIS — M546 Pain in thoracic spine: Secondary | ICD-10-CM | POA: Diagnosis not present

## 2023-03-31 DIAGNOSIS — M797 Fibromyalgia: Secondary | ICD-10-CM | POA: Diagnosis not present

## 2023-03-31 DIAGNOSIS — G44329 Chronic post-traumatic headache, not intractable: Secondary | ICD-10-CM | POA: Diagnosis not present

## 2023-03-31 DIAGNOSIS — G8929 Other chronic pain: Secondary | ICD-10-CM | POA: Diagnosis not present

## 2023-03-31 DIAGNOSIS — G894 Chronic pain syndrome: Secondary | ICD-10-CM | POA: Diagnosis not present

## 2023-04-01 ENCOUNTER — Other Ambulatory Visit: Payer: Self-pay

## 2023-04-01 ENCOUNTER — Other Ambulatory Visit (HOSPITAL_COMMUNITY): Payer: Self-pay

## 2023-04-01 MED ORDER — DICLOFENAC SODIUM 75 MG PO TBEC
75.0000 mg | DELAYED_RELEASE_TABLET | Freq: Two times a day (BID) | ORAL | 0 refills | Status: AC
  Filled 2023-04-01: qty 60, 30d supply, fill #0

## 2023-04-01 MED ORDER — OXYCODONE-ACETAMINOPHEN 5-325 MG PO TABS
1.0000 | ORAL_TABLET | Freq: Four times a day (QID) | ORAL | 0 refills | Status: AC
  Filled 2023-04-01: qty 52, 13d supply, fill #0
  Filled 2023-04-01: qty 68, 17d supply, fill #0
  Filled 2023-04-01: qty 120, 30d supply, fill #0

## 2023-04-05 ENCOUNTER — Other Ambulatory Visit (HOSPITAL_COMMUNITY): Payer: Self-pay

## 2023-04-22 ENCOUNTER — Other Ambulatory Visit (HOSPITAL_COMMUNITY): Payer: Self-pay

## 2023-04-26 ENCOUNTER — Telehealth: Payer: Self-pay | Admitting: Medical

## 2023-04-26 ENCOUNTER — Other Ambulatory Visit: Payer: Self-pay

## 2023-04-26 ENCOUNTER — Other Ambulatory Visit (HOSPITAL_COMMUNITY): Payer: Self-pay

## 2023-04-26 NOTE — Telephone Encounter (Signed)
You had referred pt to [facility] & pt has been seeing Dr. Nilsa Nutting but there was language barrier issues between pt & Dr. Nilsa Nutting & it was very hard for pt to understand him.  They both agreed this would be best for pt to find another provider.   Pt's counselor recommended [facility] on [address] t# [phone] & Fax # [phone]  He would like a referral there

## 2023-04-29 ENCOUNTER — Other Ambulatory Visit: Payer: Self-pay | Admitting: Medical

## 2023-04-29 DIAGNOSIS — G8929 Other chronic pain: Secondary | ICD-10-CM

## 2023-05-03 DIAGNOSIS — Z79891 Long term (current) use of opiate analgesic: Secondary | ICD-10-CM | POA: Diagnosis not present

## 2023-05-05 DIAGNOSIS — Z79899 Other long term (current) drug therapy: Secondary | ICD-10-CM | POA: Diagnosis not present

## 2023-05-10 ENCOUNTER — Other Ambulatory Visit (HOSPITAL_COMMUNITY): Payer: Self-pay

## 2023-05-10 DIAGNOSIS — R5383 Other fatigue: Secondary | ICD-10-CM | POA: Diagnosis not present

## 2023-05-10 DIAGNOSIS — M129 Arthropathy, unspecified: Secondary | ICD-10-CM | POA: Diagnosis not present

## 2023-05-10 DIAGNOSIS — Z1159 Encounter for screening for other viral diseases: Secondary | ICD-10-CM | POA: Diagnosis not present

## 2023-05-10 DIAGNOSIS — E559 Vitamin D deficiency, unspecified: Secondary | ICD-10-CM | POA: Diagnosis not present

## 2023-05-10 DIAGNOSIS — E7800 Pure hypercholesterolemia, unspecified: Secondary | ICD-10-CM | POA: Diagnosis not present

## 2023-05-10 DIAGNOSIS — Z79899 Other long term (current) drug therapy: Secondary | ICD-10-CM | POA: Diagnosis not present

## 2023-05-13 DIAGNOSIS — Z79899 Other long term (current) drug therapy: Secondary | ICD-10-CM | POA: Diagnosis not present

## 2023-05-24 ENCOUNTER — Other Ambulatory Visit (HOSPITAL_COMMUNITY): Payer: Self-pay

## 2023-05-24 DIAGNOSIS — Z79899 Other long term (current) drug therapy: Secondary | ICD-10-CM | POA: Diagnosis not present

## 2023-05-26 DIAGNOSIS — Z79899 Other long term (current) drug therapy: Secondary | ICD-10-CM | POA: Diagnosis not present

## 2023-05-31 DIAGNOSIS — Z79899 Other long term (current) drug therapy: Secondary | ICD-10-CM | POA: Diagnosis not present

## 2023-06-02 DIAGNOSIS — Z79899 Other long term (current) drug therapy: Secondary | ICD-10-CM | POA: Diagnosis not present

## 2023-06-06 ENCOUNTER — Other Ambulatory Visit (HOSPITAL_COMMUNITY): Payer: Self-pay

## 2023-06-07 ENCOUNTER — Other Ambulatory Visit (HOSPITAL_COMMUNITY): Payer: Self-pay

## 2023-06-07 MED ORDER — CLONIDINE HCL 0.2 MG PO TABS
0.2000 mg | ORAL_TABLET | Freq: Every day | ORAL | 0 refills | Status: DC
  Filled 2023-06-07: qty 90, 90d supply, fill #0

## 2023-06-08 ENCOUNTER — Other Ambulatory Visit (HOSPITAL_COMMUNITY): Payer: Self-pay

## 2023-06-09 ENCOUNTER — Other Ambulatory Visit (HOSPITAL_COMMUNITY): Payer: Self-pay

## 2023-06-09 ENCOUNTER — Other Ambulatory Visit: Payer: Self-pay

## 2023-06-09 MED ORDER — CARISOPRODOL 350 MG PO TABS
350.0000 mg | ORAL_TABLET | Freq: Every day | ORAL | 0 refills | Status: AC
  Filled 2023-06-09: qty 30, 30d supply, fill #0

## 2023-06-13 ENCOUNTER — Other Ambulatory Visit: Payer: Self-pay

## 2023-06-14 DIAGNOSIS — Z79899 Other long term (current) drug therapy: Secondary | ICD-10-CM | POA: Diagnosis not present

## 2023-06-21 DIAGNOSIS — Z79899 Other long term (current) drug therapy: Secondary | ICD-10-CM | POA: Diagnosis not present

## 2023-06-23 ENCOUNTER — Other Ambulatory Visit (HOSPITAL_COMMUNITY): Payer: Self-pay

## 2023-06-23 ENCOUNTER — Other Ambulatory Visit: Payer: Self-pay | Admitting: Medical

## 2023-06-23 ENCOUNTER — Other Ambulatory Visit (HOSPITAL_BASED_OUTPATIENT_CLINIC_OR_DEPARTMENT_OTHER): Payer: Self-pay

## 2023-06-24 ENCOUNTER — Other Ambulatory Visit: Payer: Self-pay

## 2023-06-24 ENCOUNTER — Other Ambulatory Visit (HOSPITAL_COMMUNITY): Payer: Self-pay

## 2023-06-24 MED ORDER — ALPRAZOLAM 0.5 MG PO TABS
0.5000 mg | ORAL_TABLET | Freq: Two times a day (BID) | ORAL | 0 refills | Status: AC | PRN
Start: 2023-06-24 — End: ?
  Filled 2023-06-24: qty 60, 30d supply, fill #0
  Filled 2023-07-25: qty 60, 30d supply, fill #1
  Filled 2023-09-22 – 2023-11-21 (×2): qty 60, 30d supply, fill #2

## 2023-06-24 NOTE — Telephone Encounter (Signed)
Gets this from his psychiatry doctor REDDY, Knox Saliva, MD

## 2023-06-27 ENCOUNTER — Other Ambulatory Visit (HOSPITAL_COMMUNITY): Payer: Self-pay

## 2023-06-27 MED ORDER — ALPRAZOLAM ER 2 MG PO TB24
2.0000 mg | ORAL_TABLET | Freq: Every evening | ORAL | 0 refills | Status: AC
Start: 2023-06-27 — End: ?
  Filled 2023-06-27: qty 30, 30d supply, fill #0
  Filled 2023-07-25: qty 30, 30d supply, fill #1
  Filled 2023-09-22 – 2023-11-21 (×2): qty 30, 30d supply, fill #2

## 2023-07-13 ENCOUNTER — Other Ambulatory Visit: Payer: Self-pay

## 2023-07-13 ENCOUNTER — Other Ambulatory Visit (HOSPITAL_COMMUNITY): Payer: Self-pay

## 2023-07-13 DIAGNOSIS — F3181 Bipolar II disorder: Secondary | ICD-10-CM | POA: Diagnosis not present

## 2023-07-13 DIAGNOSIS — F4312 Post-traumatic stress disorder, chronic: Secondary | ICD-10-CM | POA: Diagnosis not present

## 2023-07-13 MED ORDER — CARISOPRODOL 350 MG PO TABS
350.0000 mg | ORAL_TABLET | Freq: Every day | ORAL | 0 refills | Status: AC | PRN
  Filled 2023-07-13: qty 90, 90d supply, fill #0
  Filled 2023-12-23: qty 30, 30d supply, fill #0

## 2023-07-13 MED ORDER — CLONIDINE HCL 0.2 MG PO TABS
0.2000 mg | ORAL_TABLET | Freq: Every day | ORAL | 0 refills | Status: DC
Start: 2023-07-13 — End: 2023-12-01
  Filled 2023-07-13: qty 90, 90d supply, fill #0
  Filled 2023-09-05: qty 74, 74d supply, fill #0
  Filled 2023-09-05: qty 16, 16d supply, fill #0

## 2023-07-13 MED ORDER — ALPRAZOLAM ER 2 MG PO TB24
2.0000 mg | ORAL_TABLET | Freq: Every day | ORAL | 0 refills | Status: DC
Start: 2023-07-13 — End: 2023-11-22
  Filled 2023-07-13: qty 90, 90d supply, fill #0
  Filled 2023-08-22: qty 30, 30d supply, fill #0
  Filled 2023-09-22: qty 30, 30d supply, fill #1
  Filled 2023-10-21: qty 30, 30d supply, fill #2

## 2023-07-13 MED ORDER — CYCLOBENZAPRINE HCL 10 MG PO TABS
10.0000 mg | ORAL_TABLET | Freq: Every day | ORAL | 0 refills | Status: DC
Start: 2023-07-13 — End: 2024-06-05
  Filled 2023-07-13: qty 30, 30d supply, fill #0

## 2023-07-13 MED ORDER — DRONABINOL 5 MG PO CAPS
5.0000 mg | ORAL_CAPSULE | Freq: Two times a day (BID) | ORAL | 2 refills | Status: DC
Start: 2023-07-13 — End: 2024-07-31
  Filled 2023-07-13 – 2023-08-01 (×6): qty 60, 30d supply, fill #0

## 2023-07-13 MED ORDER — DULOXETINE HCL 60 MG PO CPEP
120.0000 mg | ORAL_CAPSULE | Freq: Every morning | ORAL | 0 refills | Status: DC
  Filled 2023-07-13 – 2023-09-19 (×2): qty 180, 90d supply, fill #0

## 2023-07-13 MED ORDER — QUETIAPINE FUMARATE 300 MG PO TABS
300.0000 mg | ORAL_TABLET | Freq: Every day | ORAL | 0 refills | Status: DC
  Filled 2023-07-13 – 2023-09-22 (×2): qty 90, 90d supply, fill #0

## 2023-07-13 MED ORDER — ALPRAZOLAM 0.5 MG PO TABS
0.5000 mg | ORAL_TABLET | Freq: Two times a day (BID) | ORAL | 0 refills | Status: DC | PRN
Start: 2023-07-13 — End: 2023-11-22
  Filled 2023-07-13: qty 180, 90d supply, fill #0
  Filled 2023-08-22: qty 60, 30d supply, fill #0
  Filled 2023-09-22: qty 60, 30d supply, fill #1
  Filled 2023-10-21: qty 60, 30d supply, fill #2

## 2023-07-14 ENCOUNTER — Other Ambulatory Visit (HOSPITAL_COMMUNITY): Payer: Self-pay

## 2023-07-15 ENCOUNTER — Other Ambulatory Visit (HOSPITAL_COMMUNITY): Payer: Self-pay

## 2023-07-18 ENCOUNTER — Other Ambulatory Visit (HOSPITAL_COMMUNITY): Payer: Self-pay

## 2023-07-18 DIAGNOSIS — Z79899 Other long term (current) drug therapy: Secondary | ICD-10-CM | POA: Diagnosis not present

## 2023-07-18 DIAGNOSIS — M549 Dorsalgia, unspecified: Secondary | ICD-10-CM | POA: Diagnosis not present

## 2023-07-18 DIAGNOSIS — G8929 Other chronic pain: Secondary | ICD-10-CM | POA: Diagnosis not present

## 2023-07-18 DIAGNOSIS — F339 Major depressive disorder, recurrent, unspecified: Secondary | ICD-10-CM | POA: Diagnosis not present

## 2023-07-18 DIAGNOSIS — R030 Elevated blood-pressure reading, without diagnosis of hypertension: Secondary | ICD-10-CM | POA: Diagnosis not present

## 2023-07-18 DIAGNOSIS — R519 Headache, unspecified: Secondary | ICD-10-CM | POA: Diagnosis not present

## 2023-07-18 DIAGNOSIS — Z6826 Body mass index (BMI) 26.0-26.9, adult: Secondary | ICD-10-CM | POA: Diagnosis not present

## 2023-07-18 DIAGNOSIS — T402X5A Adverse effect of other opioids, initial encounter: Secondary | ICD-10-CM | POA: Diagnosis not present

## 2023-07-18 DIAGNOSIS — K5903 Drug induced constipation: Secondary | ICD-10-CM | POA: Diagnosis not present

## 2023-07-18 DIAGNOSIS — F1220 Cannabis dependence, uncomplicated: Secondary | ICD-10-CM | POA: Diagnosis not present

## 2023-07-19 ENCOUNTER — Other Ambulatory Visit (HOSPITAL_COMMUNITY): Payer: Self-pay

## 2023-07-20 DIAGNOSIS — Z79899 Other long term (current) drug therapy: Secondary | ICD-10-CM | POA: Diagnosis not present

## 2023-07-22 ENCOUNTER — Other Ambulatory Visit (HOSPITAL_COMMUNITY): Payer: Self-pay

## 2023-07-25 ENCOUNTER — Other Ambulatory Visit (HOSPITAL_COMMUNITY): Payer: Self-pay

## 2023-07-28 ENCOUNTER — Other Ambulatory Visit (HOSPITAL_COMMUNITY): Payer: Self-pay

## 2023-08-01 ENCOUNTER — Other Ambulatory Visit (HOSPITAL_COMMUNITY): Payer: Self-pay

## 2023-08-02 ENCOUNTER — Other Ambulatory Visit (HOSPITAL_BASED_OUTPATIENT_CLINIC_OR_DEPARTMENT_OTHER): Payer: Self-pay

## 2023-08-02 ENCOUNTER — Other Ambulatory Visit (HOSPITAL_COMMUNITY): Payer: Self-pay

## 2023-08-12 DIAGNOSIS — Z79899 Other long term (current) drug therapy: Secondary | ICD-10-CM | POA: Diagnosis not present

## 2023-08-18 DIAGNOSIS — Z79899 Other long term (current) drug therapy: Secondary | ICD-10-CM | POA: Diagnosis not present

## 2023-08-22 ENCOUNTER — Other Ambulatory Visit (HOSPITAL_COMMUNITY): Payer: Self-pay

## 2023-09-05 ENCOUNTER — Other Ambulatory Visit: Payer: Self-pay

## 2023-09-05 ENCOUNTER — Other Ambulatory Visit (HOSPITAL_COMMUNITY): Payer: Self-pay

## 2023-09-05 MED ORDER — NALOXONE HCL 4 MG/0.1ML NA LIQD
1.0000 | NASAL | 0 refills | Status: AC
  Filled 2023-09-05: qty 2, 30d supply, fill #0

## 2023-09-06 ENCOUNTER — Other Ambulatory Visit (HOSPITAL_COMMUNITY): Payer: Self-pay

## 2023-09-07 ENCOUNTER — Other Ambulatory Visit (HOSPITAL_COMMUNITY): Payer: Self-pay

## 2023-09-09 ENCOUNTER — Other Ambulatory Visit (HOSPITAL_COMMUNITY): Payer: Self-pay

## 2023-09-09 DIAGNOSIS — Z79899 Other long term (current) drug therapy: Secondary | ICD-10-CM | POA: Diagnosis not present

## 2023-09-13 ENCOUNTER — Other Ambulatory Visit (HOSPITAL_COMMUNITY): Payer: Self-pay

## 2023-09-13 ENCOUNTER — Other Ambulatory Visit (HOSPITAL_BASED_OUTPATIENT_CLINIC_OR_DEPARTMENT_OTHER): Payer: Self-pay

## 2023-09-16 DIAGNOSIS — Z79899 Other long term (current) drug therapy: Secondary | ICD-10-CM | POA: Diagnosis not present

## 2023-09-19 ENCOUNTER — Other Ambulatory Visit (HOSPITAL_COMMUNITY): Payer: Self-pay

## 2023-09-22 ENCOUNTER — Other Ambulatory Visit (HOSPITAL_COMMUNITY): Payer: Self-pay

## 2023-09-23 ENCOUNTER — Other Ambulatory Visit: Payer: Self-pay

## 2023-09-23 DIAGNOSIS — Z79899 Other long term (current) drug therapy: Secondary | ICD-10-CM | POA: Diagnosis not present

## 2023-09-27 DIAGNOSIS — Z79899 Other long term (current) drug therapy: Secondary | ICD-10-CM | POA: Diagnosis not present

## 2023-10-14 ENCOUNTER — Other Ambulatory Visit (HOSPITAL_COMMUNITY): Payer: Self-pay

## 2023-10-14 ENCOUNTER — Other Ambulatory Visit (HOSPITAL_BASED_OUTPATIENT_CLINIC_OR_DEPARTMENT_OTHER): Payer: Self-pay

## 2023-10-21 ENCOUNTER — Ambulatory Visit (INDEPENDENT_AMBULATORY_CARE_PROVIDER_SITE_OTHER): Payer: Commercial Managed Care - PPO | Admitting: Medical

## 2023-10-21 ENCOUNTER — Other Ambulatory Visit: Payer: Self-pay

## 2023-10-21 VITALS — BP 112/76 | HR 85 | Ht 75.0 in | Wt 200.2 lb

## 2023-10-21 DIAGNOSIS — R519 Headache, unspecified: Secondary | ICD-10-CM

## 2023-10-21 DIAGNOSIS — F332 Major depressive disorder, recurrent severe without psychotic features: Secondary | ICD-10-CM | POA: Diagnosis not present

## 2023-10-21 DIAGNOSIS — H9193 Unspecified hearing loss, bilateral: Secondary | ICD-10-CM | POA: Insufficient documentation

## 2023-10-21 DIAGNOSIS — G8929 Other chronic pain: Secondary | ICD-10-CM | POA: Insufficient documentation

## 2023-10-21 DIAGNOSIS — Z136 Encounter for screening for cardiovascular disorders: Secondary | ICD-10-CM | POA: Diagnosis not present

## 2023-10-21 DIAGNOSIS — M542 Cervicalgia: Secondary | ICD-10-CM | POA: Diagnosis not present

## 2023-10-21 DIAGNOSIS — L989 Disorder of the skin and subcutaneous tissue, unspecified: Secondary | ICD-10-CM

## 2023-10-21 DIAGNOSIS — L608 Other nail disorders: Secondary | ICD-10-CM

## 2023-10-21 DIAGNOSIS — I861 Scrotal varices: Secondary | ICD-10-CM | POA: Insufficient documentation

## 2023-10-21 DIAGNOSIS — M79605 Pain in left leg: Secondary | ICD-10-CM | POA: Insufficient documentation

## 2023-10-21 DIAGNOSIS — Z23 Encounter for immunization: Secondary | ICD-10-CM | POA: Diagnosis not present

## 2023-10-21 DIAGNOSIS — F17200 Nicotine dependence, unspecified, uncomplicated: Secondary | ICD-10-CM

## 2023-10-21 DIAGNOSIS — M79604 Pain in right leg: Secondary | ICD-10-CM | POA: Diagnosis not present

## 2023-10-21 DIAGNOSIS — H539 Unspecified visual disturbance: Secondary | ICD-10-CM

## 2023-10-21 DIAGNOSIS — R8290 Unspecified abnormal findings in urine: Secondary | ICD-10-CM | POA: Diagnosis not present

## 2023-10-21 DIAGNOSIS — Z13220 Encounter for screening for lipoid disorders: Secondary | ICD-10-CM | POA: Diagnosis not present

## 2023-10-21 DIAGNOSIS — Z0000 Encounter for general adult medical examination without abnormal findings: Secondary | ICD-10-CM | POA: Insufficient documentation

## 2023-10-21 DIAGNOSIS — Z7185 Encounter for immunization safety counseling: Secondary | ICD-10-CM | POA: Insufficient documentation

## 2023-10-21 DIAGNOSIS — M791 Myalgia, unspecified site: Secondary | ICD-10-CM | POA: Diagnosis not present

## 2023-10-21 LAB — POCT URINALYSIS DIP (PROADVANTAGE DEVICE)
Bilirubin, UA: NEGATIVE
Blood, UA: NEGATIVE
Glucose, UA: NEGATIVE mg/dL
Ketones, POC UA: NEGATIVE mg/dL
Leukocytes, UA: NEGATIVE
Nitrite, UA: NEGATIVE
Protein Ur, POC: NEGATIVE mg/dL
Specific Gravity, Urine: 1.01
Urobilinogen, Ur: 0.2
pH, UA: 7.5 (ref 5.0–8.0)

## 2023-10-21 LAB — LIPID PANEL

## 2023-10-21 NOTE — Progress Notes (Unsigned)
Subjective:   HPI  Carl Hughes is a 32 y.o. male who presents for Chief Complaint  Patient presents with   Annual Exam    Patient Care Team: Makendra Vigeant, Kermit Balo, PA-C as PCP - General (Family Medicine) Drema Dallas, DO as Consulting Physician (Neurology) Roselind Messier, MD as Referring Physician (Psychiatry) Jamey Reas, PA-C (Physician Assistant)   Concerns: He has several concerns and questions today.  Is concerned about chronic headaches, ever since some prior trauma in the last year he has had ongoing headaches.  They can be front to back, fairly frequent most days per week, sometimes photophobia and phonophobia.  He has history of chronic pain, aches in legs, pain in neck, pain in back - sees pain management at [facility]  He has questions about DNR, how to get a DNR, what is it exactly mean.  He did not really voice a desire to do paperwork for this, but just had questions about.  He notes that his hearing seems.  He wants a hearing screen  Lately feels like his kidneys been hurting on both sides or particular in the right.  No urinary change.  No blood in urine, no discolored urine, no odor in urine.  No urinary frequency or urgency.  Driving at night,can't look at lights, looks at lines on either side  Feels like there could be a bump on the right side of his face has been irritated.    Legs feel like somebody has hit them, achy.   Not doing anything , no cramping, sometimes giving out.  Sometimes legs tingle.  No swelling legs.  He has concerns about his great toe nail on right, stuff catches under nail  He is worried about foot odor, possible foot fungus   He has questions about toxoplasmosis.  Sometimes feels brain fog    Reviewed their medical, surgical, family, social, medication, and allergy history and updated chart as appropriate.  Allergies  Allergen Reactions   Zofran [Ondansetron Hcl] Rash   Buspar [Buspirone Hcl] Hives   Ibuprofen  [Ibuprofen] Hives   Lamictal [Lamotrigine]     Unknown reaction   Minipress [Prazosin]     Unknown reaction   Toradol [Ketorolac Tromethamine]     Dry mouth and headache    Past Medical History:  Diagnosis Date   Acute left-sided back pain 01/30/2014   Acute nonintractable headache 11/17/2020   Alcohol abuse 09/02/2013   Anal fissure    Benzodiazepine abuse/dependence 09/02/2013   Bipolar disorder, unspecified 01/30/2014   Dr. Phillip Heal, [facility]   Chronic back pain    Cocaine abuse 09/02/2013   reported cessation for several years   Concussion with no loss of consciousness 11/17/2020   Corn of foot 07/19/2018   Drug-seeking behavior    Exercise-induced asthma    Facial trauma 11/17/2020   Family history of adverse reaction to anesthesia    Family history of autoimmune disorder 09/08/2020   Fibromyalgia    Flank pain 08/01/2019   Gallbladder polyp    Generalized anxiety disorder    GERD (gastroesophageal reflux disease)    Hyperhidrosis of feet 07/19/2018   IBS (irritable bowel syndrome)    Insomnia 01/30/2014   Livedo reticularis 09/08/2020   Major depressive disorder 04/04/2014   Marijuana abuse, continuous 09/02/2013   Meralgia paresthetica 09/08/2020   Metatarsalgia of left foot 07/19/2018   Opiate abuse, episodic 09/02/2013   Paresthesia 09/08/2020   Pupil irregular of left eye 11/17/2020  Renal colic on left side 08/01/2019   Renal stone 08/01/2019   Skin rash 09/08/2020   Substance induced mood disorder 09/02/2013   Syncope 01/07/2020   Tobacco use disorder    Wears glasses     Current Outpatient Medications on File Prior to Visit  Medication Sig Dispense Refill   ALPRAZolam (XANAX XR) 2 MG 24 hr tablet Take 1 tablet (2 mg total) by mouth at bedtime. 90 tablet 1   ALPRAZolam (XANAX XR) 2 MG 24 hr tablet Take 1 tablet (2 mg total) by mouth at bedtime. 90 tablet 0   ALPRAZolam (XANAX XR) 2 MG 24 hr tablet Take 1 tablet (2 mg total)  by mouth at bedtime. 90 tablet 0   ALPRAZolam (XANAX) 0.5 MG tablet Take 1 tablet (0.5 mg total) by mouth 2 (two) times daily as needed. (Patient taking differently: Take 0.5 mg by mouth 2 (two) times daily as needed for anxiety.) 180 tablet 1   ALPRAZolam (XANAX) 0.5 MG tablet Take 1 tablet (0.5 mg total) by mouth 2 (two) times daily as needed. 180 tablet 0   ALPRAZolam (XANAX) 0.5 MG tablet Take 1 tablet (0.5 mg total) by mouth 2 (two) times daily as needed. 180 tablet 0   carisoprodol (SOMA) 350 MG tablet Take 1 tablet (350 mg total) by mouth daily if needed 90 tablet 0   carisoprodol (SOMA) 350 MG tablet Take 1 tablet (350 mg total) by mouth daily as needed. 90 tablet 0   cloNIDine (CATAPRES) 0.2 MG tablet Take 1 tablet (0.2 mg total) by mouth at bedtime. 90 tablet 0   cyclobenzaprine (FLEXERIL) 10 MG tablet Take 1 tablet (10 mg total) by mouth at bedtime. 90 tablet 1   cyclobenzaprine (FLEXERIL) 10 MG tablet Take 1 tablet (10 mg total) by mouth at bedtime. 90 tablet 0   dronabinol (MARINOL) 5 MG capsule Take 1 capsule (5 mg total) by mouth 2 (two) times daily. 60 capsule 2   DULoxetine (CYMBALTA) 60 MG capsule Take 2 capsules (120 mg total) by mouth in the morning. 180 capsule 0   naloxone (NARCAN) nasal spray 4 mg/0.1 mL Place 1 spray into the nose as directed 2 each 0   oxyCODONE-acetaminophen (PERCOCET) 5-325 MG tablet Take 1 tablet by mouth every 6 (six) hours. 120 tablet 0   QUEtiapine (SEROQUEL) 300 MG tablet Take 1 tablet (300 mg total) by mouth at bedtime. 90 tablet 0   cephALEXin (KEFLEX) 500 MG capsule Take 1 capsule (500 mg total) by mouth 4 (four) times daily. (Patient not taking: Reported on 10/21/2023) 20 capsule 0   clotrimazole-betamethasone (LOTRISONE) cream Apply 1 application topically daily. (Patient not taking: Reported on 10/21/2023) 45 g 0   diazepam (VALIUM) 10 MG tablet Take 1 tablet (10 mg total) by mouth at bedtime as needed. (Patient not taking: Reported on  10/21/2023) 90 tablet 1   diazepam (VALIUM) 10 MG tablet Take 1 tablet (10 mg total) by mouth at bedtime. (Patient not taking: Reported on 10/21/2023) 5 tablet 0   diclofenac (VOLTAREN) 75 MG EC tablet Take 1 tablet (75 mg total) by mouth every 12 (twelve) hours. (Patient not taking: Reported on 10/21/2023) 60 tablet 0   fluconazole (DIFLUCAN) 150 MG tablet Take 1 tablet (150 mg total) by mouth once a week. (Patient not taking: Reported on 06/22/2022) 4 tablet 0   hydrOXYzine (VISTARIL) 25 MG capsule Take 1 capsule (25 mg total) by mouth 2 (two) times daily as needed for itching (Patient not taking:  Reported on 10/21/2023) 30 capsule 1   lidocaine (LIDODERM) 5 % Place 1 patch onto the skin daily. Remove & Discard patch within 12 hours or as directed by MD (Patient not taking: Reported on 10/21/2023) 90 patch 0   methylPREDNISolone (MEDROL) 4 MG TBPK tablet Use as directed (Patient not taking: Reported on 10/21/2023) 21 tablet 0   modafinil (PROVIGIL) 100 MG tablet Take 1 tablet (100 mg total) by mouth daily as needed. (Patient not taking: Reported on 06/22/2022) 10 tablet 3   mupirocin ointment (BACTROBAN) 2 % Apply 1 Application topically 2 (two) times daily. (Patient not taking: Reported on 10/21/2023) 22 g 0   pramipexole (MIRAPEX) 0.75 MG tablet Take 1 tablet (0.75 mg total) by mouth at bedtime. (Patient not taking: Reported on 10/21/2023) 90 tablet 1   pregabalin (LYRICA) 75 MG capsule Take 1 capsule (75 mg total) by mouth 2 (two) times daily. (Patient not taking: Reported on 10/21/2023) 60 capsule 1   tiZANidine (ZANAFLEX) 4 MG tablet Take 1 tablet (4 mg total) by mouth at bedtime as needed for muscle spasms. (Patient not taking: Reported on 10/21/2023) 30 tablet 0   No current facility-administered medications on file prior to visit.      Current Outpatient Medications:    ALPRAZolam (XANAX XR) 2 MG 24 hr tablet, Take 1 tablet (2 mg total) by mouth at bedtime., Disp: 90 tablet, Rfl: 1    ALPRAZolam (XANAX XR) 2 MG 24 hr tablet, Take 1 tablet (2 mg total) by mouth at bedtime., Disp: 90 tablet, Rfl: 0   ALPRAZolam (XANAX XR) 2 MG 24 hr tablet, Take 1 tablet (2 mg total) by mouth at bedtime., Disp: 90 tablet, Rfl: 0   ALPRAZolam (XANAX) 0.5 MG tablet, Take 1 tablet (0.5 mg total) by mouth 2 (two) times daily as needed. (Patient taking differently: Take 0.5 mg by mouth 2 (two) times daily as needed for anxiety.), Disp: 180 tablet, Rfl: 1   ALPRAZolam (XANAX) 0.5 MG tablet, Take 1 tablet (0.5 mg total) by mouth 2 (two) times daily as needed., Disp: 180 tablet, Rfl: 0   ALPRAZolam (XANAX) 0.5 MG tablet, Take 1 tablet (0.5 mg total) by mouth 2 (two) times daily as needed., Disp: 180 tablet, Rfl: 0   carisoprodol (SOMA) 350 MG tablet, Take 1 tablet (350 mg total) by mouth daily if needed, Disp: 90 tablet, Rfl: 0   carisoprodol (SOMA) 350 MG tablet, Take 1 tablet (350 mg total) by mouth daily as needed., Disp: 90 tablet, Rfl: 0   cloNIDine (CATAPRES) 0.2 MG tablet, Take 1 tablet (0.2 mg total) by mouth at bedtime., Disp: 90 tablet, Rfl: 0   cyclobenzaprine (FLEXERIL) 10 MG tablet, Take 1 tablet (10 mg total) by mouth at bedtime., Disp: 90 tablet, Rfl: 1   cyclobenzaprine (FLEXERIL) 10 MG tablet, Take 1 tablet (10 mg total) by mouth at bedtime., Disp: 90 tablet, Rfl: 0   dronabinol (MARINOL) 5 MG capsule, Take 1 capsule (5 mg total) by mouth 2 (two) times daily., Disp: 60 capsule, Rfl: 2   DULoxetine (CYMBALTA) 60 MG capsule, Take 2 capsules (120 mg total) by mouth in the morning., Disp: 180 capsule, Rfl: 0   naloxone (NARCAN) nasal spray 4 mg/0.1 mL, Place 1 spray into the nose as directed, Disp: 2 each, Rfl: 0   oxyCODONE-acetaminophen (PERCOCET) 5-325 MG tablet, Take 1 tablet by mouth every 6 (six) hours., Disp: 120 tablet, Rfl: 0   QUEtiapine (SEROQUEL) 300 MG tablet, Take 1 tablet (300 mg  total) by mouth at bedtime., Disp: 90 tablet, Rfl: 0   cephALEXin (KEFLEX) 500 MG capsule, Take 1  capsule (500 mg total) by mouth 4 (four) times daily. (Patient not taking: Reported on 10/21/2023), Disp: 20 capsule, Rfl: 0   clotrimazole-betamethasone (LOTRISONE) cream, Apply 1 application topically daily. (Patient not taking: Reported on 10/21/2023), Disp: 45 g, Rfl: 0   diazepam (VALIUM) 10 MG tablet, Take 1 tablet (10 mg total) by mouth at bedtime as needed. (Patient not taking: Reported on 10/21/2023), Disp: 90 tablet, Rfl: 1   diazepam (VALIUM) 10 MG tablet, Take 1 tablet (10 mg total) by mouth at bedtime. (Patient not taking: Reported on 10/21/2023), Disp: 5 tablet, Rfl: 0   diclofenac (VOLTAREN) 75 MG EC tablet, Take 1 tablet (75 mg total) by mouth every 12 (twelve) hours. (Patient not taking: Reported on 10/21/2023), Disp: 60 tablet, Rfl: 0   fluconazole (DIFLUCAN) 150 MG tablet, Take 1 tablet (150 mg total) by mouth once a week. (Patient not taking: Reported on 06/22/2022), Disp: 4 tablet, Rfl: 0   hydrOXYzine (VISTARIL) 25 MG capsule, Take 1 capsule (25 mg total) by mouth 2 (two) times daily as needed for itching (Patient not taking: Reported on 10/21/2023), Disp: 30 capsule, Rfl: 1   lidocaine (LIDODERM) 5 %, Place 1 patch onto the skin daily. Remove & Discard patch within 12 hours or as directed by MD (Patient not taking: Reported on 10/21/2023), Disp: 90 patch, Rfl: 0   methylPREDNISolone (MEDROL) 4 MG TBPK tablet, Use as directed (Patient not taking: Reported on 10/21/2023), Disp: 21 tablet, Rfl: 0   modafinil (PROVIGIL) 100 MG tablet, Take 1 tablet (100 mg total) by mouth daily as needed. (Patient not taking: Reported on 06/22/2022), Disp: 10 tablet, Rfl: 3   mupirocin ointment (BACTROBAN) 2 %, Apply 1 Application topically 2 (two) times daily. (Patient not taking: Reported on 10/21/2023), Disp: 22 g, Rfl: 0   pramipexole (MIRAPEX) 0.75 MG tablet, Take 1 tablet (0.75 mg total) by mouth at bedtime. (Patient not taking: Reported on 10/21/2023), Disp: 90 tablet, Rfl: 1   pregabalin (LYRICA)  75 MG capsule, Take 1 capsule (75 mg total) by mouth 2 (two) times daily. (Patient not taking: Reported on 10/21/2023), Disp: 60 capsule, Rfl: 1   tiZANidine (ZANAFLEX) 4 MG tablet, Take 1 tablet (4 mg total) by mouth at bedtime as needed for muscle spasms. (Patient not taking: Reported on 10/21/2023), Disp: 30 tablet, Rfl: 0  Family History  Problem Relation Age of Onset   Depression Father    Other Father        chronic back pain   Arthritis Father    Gout Father    Heart disease Paternal Grandfather    Depression Paternal Grandfather    Colon polyps Mother        benign   Irritable bowel syndrome Mother    Heart disease Maternal Grandmother    Diabetes Maternal Grandfather    Cancer Maternal Grandfather        colorectal   Hypertension Maternal Grandfather    Hyperlipidemia Maternal Grandfather     Past Surgical History:  Procedure Laterality Date   WISDOM TOOTH EXTRACTION       ROS As in subjective     Objective:  BP 112/76   Pulse 85   Ht 6\' 3"  (1.905 m)   Wt 200 lb 3.2 oz (90.8 kg)   SpO2 93%   BMI 25.02 kg/m   General appearance: alert, no distress, WD/WN, Caucasian male  Skin: tattoos right dorsal hand, no otherwise worrisome lesions HEENT: normocephalic, conjunctiva/corneas normal, sclerae anicteric, PERRLA, EOMi, nares patent, no discharge or erythema, pharynx normal Oral cavity: MMM, tongue normal, teeth in good repair Neck: supple, no lymphadenopathy, no thyromegaly, no masses, normal ROM, no bruits Chest: non tender, normal shape and expansion Heart: RRR, normal S1, S2, no murmurs Lungs: CTA bilaterally, no wheezes, rhonchi, or rales Abdomen: +bs, soft, non tender, non distended, no masses, no hepatomegaly, no splenomegaly, no bruits Back: limited exam unremarkable Musculoskeletal: no obvious tendnerss, normal ROM, unremarkable limited exam extremities: no edema, no cyanosis, no clubbing Pulses: 2+ symmetric, upper and lower extremities, normal cap  refill Neurological: alert, oriented x 3, CN2-12 intact, strength normal upper extremities and lower extremities, sensation normal throughout, DTRs 2+ throughout, no cerebellar signs, gait normal Psychiatric: normal affect, behavior normal, pleasant  GU: normal male external genitalia,circumcised, nontender, +mild varicocele right side, no masses, no hernia, no lymphadenopathy Rectal: deferred    Assessment and Plan :   Encounter Diagnoses  Name Primary?   Encounter for health maintenance examination in adult Yes   Vaccine counseling    Encounter for lipid screening for cardiovascular disease    Decreased hearing of both ears    Pain in both lower extremities    Myalgia    Acquired deformity of toenail    Chronic nonintractable headache, unspecified headache type    Severe episode of recurrent major depressive disorder, without psychotic features (HCC)    Tobacco use disorder    Chronic neck pain    Other chronic pain    Vision disturbance    Abnormal urinalysis    Varicocele    Need for HPV vaccine     This visit was a preventative care visit, also known as wellness visit or routine physical.   Topics typically include healthy lifestyle, diet, exercise, preventative care, vaccinations, sick and well care, proper use of emergency dept and after hours care, as well as other concerns.     Separate significant issues discussed: Myalgias, pain in legs, chronic neck and back pain-sees pain management, [facility]  Tobacco use-encouraged him to consider quit smoking  Varicocele-relatively small/minor, reassured  Visual disturbance-follow-up with your eye doctor soon  Toenail deformity-we discussed hot bath soaks and using emery board to push skin away from the nailbed.  If worsening let me know and we will refer to podiatry  History depression and mental health issues, history of substance abuse-continue routine care with psychiatry  He had questions about DNR,  advanced directives not exactly sure his motive for asking those questions.  Basely answered his questions.  No paperwork was done or determination about any type of directive made.  As of now full code with no specific instructions on advanced directives  Abnormal hearing screen-recommend audiology consult.  We discussed his concern about a skin lesion on the right face.  No lesions seen today on exam, no obvious papule macule or other worrisome finding of the right face  Chronic headaches-follow-up with neurology   General Recommendations: Continue to return yearly for your annual wellness and preventative care visits.  This gives [place] a chance to discuss healthy lifestyle, exercise, vaccinations, review your chart record, and perform screenings where appropriate.  I recommend you see your eye doctor yearly for routine vision care.  I recommend you see your dentist yearly for routine dental care including hygiene visits twice yearly.   Vaccination  Immunization History  Administered Date(s) Administered   HPV Quadrivalent  01/30/2014, 04/26/2014   Influenza Nasal 08/02/2012   Influenza Split 08/18/2011   Tdap 04/25/2011    Declines vaccines    Screening for cancer: Colon cancer screening: Age 54  Testicular cancer screening You should do a monthly self testicular exam if you are between 57-105 years old, and we typically do a testicular exam on the yearly physical for this same age group.   Prostate Cancer screening: The recommended prostate cancer screening test is a blood test called the prostate-specific antigen (PSA) test. PSA is a protein that is made in the prostate. As you age, your prostate naturally produces more PSA. Abnormally high PSA levels may be caused by: Prostate cancer. An enlarged prostate that is not caused by cancer (benign prostatic hyperplasia, or BPH). This condition is very common in older men. A prostate gland infection (prostatitis) or urinary tract  infection. Certain medicines such as male hormones (like testosterone) or other medicines that raise testosterone levels. A rectal exam may be done as part of prostate cancer screening to help provide information about the size of your prostate gland. When a rectal exam is performed, it should be done after the PSA level is drawn to avoid any effect on the results.   Skin cancer screening: Check your skin regularly for new changes, growing lesions, or other lesions of concern Come in for evaluation if you have skin lesions of concern.   Lung cancer screening: If you have a greater than 20 pack year history of tobacco use, then you may qualify for lung cancer screening with a chest CT scan.   Please call your insurance company to inquire about coverage for this test.   Pancreatic cancer:  no current screening test is available or routinely recommended. (risk factors: smoking, overweight or obese, diabetes, chronic pancreatitis, work exposure - dry cleaning, metal working, 32yo>, M>F, Tree surgeon, family hx/o, hereditary breast, ovarian, melanoma, lynch, peutz-jeghers).  Symptoms: jaundice, dark urine, light color or greasy stools, itchy skin, belly or back pain, weight loss, poor appetite, nause, vomiting, liver enlargement, DVT/blood clots.   We currently don't have screenings for other cancers besides breast, cervical, colon, and lung cancers.  If you have a strong family history of cancer or have other cancer screening concerns, please let me know.  Genetic testing referral is an option for individuals with high cancer risk in the family.  There are some other cancer screenings in development currently.   Bone health: Get at least 150 minutes of aerobic exercise weekly Get weight bearing exercise at least once weekly Bone density test:  A bone density test is an imaging test that uses a type of X-ray to measure the amount of calcium and other minerals in your bones. The test may be  used to diagnose or screen you for a condition that causes weak or thin bones (osteoporosis), predict your risk for a broken bone (fracture), or determine how well your osteoporosis treatment is working. The bone density test is recommended for females 65 and older, or females or males <65 if certain risk factors such as thyroid disease, long term use of steroids such as for asthma or rheumatological issues, vitamin D deficiency, estrogen deficiency, family history of osteoporosis, self or family history of fragility fracture in first degree relative.    Heart health: Get at least 150 minutes of aerobic exercise weekly Limit alcohol It is important to maintain a healthy blood pressure and healthy cholesterol numbers  Heart disease screening: Screening for heart disease includes screening  for blood pressure, fasting lipids, glucose/diabetes screening, BMI height to weight ratio, reviewed of smoking status, physical activity, and diet.    Goals include blood pressure 120/80 or less, maintaining a healthy lipid/cholesterol profile, preventing diabetes or keeping diabetes numbers under good control, not smoking or using tobacco products, exercising most days per week or at least 150 minutes per week of exercise, and eating healthy variety of fruits and vegetables, healthy oils, and avoiding unhealthy food choices like fried food, fast food, high sugar and high cholesterol foods.    Other tests may possibly include EKG test, CT coronary calcium score, echocardiogram, exercise treadmill stress test.      Medical care options: I recommend you continue to seek care here first for routine care.  We try really hard to have available appointments Monday through Friday daytime hours for sick visits, acute visits, and physicals.  Urgent care should be used for after hours and weekends for significant issues that cannot wait till the next day.  The emergency department should be used for significant potentially  life-threatening emergencies.  The emergency department is expensive, can often have long wait times for less significant concerns, so try to utilize primary care, urgent care, or telemedicine when possible to avoid unnecessary trips to the emergency department.  Virtual visits and telemedicine have been introduced since the pandemic started in 2020, and can be convenient ways to receive medical care.  We offer virtual appointments as well to assist you in a variety of options to seek medical care.   Legal  Take the time to do a last will and testament, Advanced Directives including Health Care Power of Attorney and Living Will documents.  Don't leave your family with burdens that can be handled ahead of time.   Advanced Directives: I recommend you consider completing a Health Care Power of Attorney and Living Will.   These documents respect your wishes and help alleviate burdens on your loved ones if you were to become terminally ill or be in a position to need those documents enforced.    You can complete Advanced Directives yourself, have them notarized, then have copies made for our office, for you and for anybody you feel should have them in safe keeping.  Or, you can have an attorney prepare these documents.   If you haven't updated your Last Will and Testament in a while, it may be worthwhile having an attorney prepare these documents together and save on some costs.       Spiritual and Emotional Health Keeping a healthy spiritual life can help you better manage your physical health. Your spiritual life can help you to cope with any issues that may arise with your physical health.  Balance can keep [place] healthy and help [place] to recover.  If you are struggling with your spiritual health there are questions that you may want to ask yourself:  What makes me feel most complete? When do I feel most connected to the rest of the world? Where do I find the most inner strength? What am I doing when  I feel whole?  Helpful tips: Being in nature. Some people feel very connected and at peace when they are walking outdoors or are outside. Helping others. Some feel the largest sense of wellbeing when they are of service to others. Being of service can take on many forms. It can be doing volunteer work, being kind to strangers, or offering a hand to a friend in need. Gratitude. Some people find  they feel the most connected when they remain grateful. They may make lists of all the things they are grateful for or say a thank you out loud for all they have.   Carl Hughes "Marcello Moores" was seen today for annual exam.  Diagnoses and all orders for this visit:  Encounter for health maintenance examination in adult -     Comprehensive metabolic panel -     CBC with Differential/Platelet -     Lipid panel -     TSH + free T4 -     POCT Urinalysis DIP (Proadvantage Device) -     CK -     Sedimentation rate -     GC/Chlamydia Probe Amp([facility]) -     Urine Culture  Vaccine counseling  Encounter for lipid screening for cardiovascular disease -     Lipid panel  Decreased hearing of both ears  Pain in both lower extremities -     CK  Myalgia -     CK -     Sedimentation rate  Acquired deformity of toenail  Chronic nonintractable headache, unspecified headache type  Severe episode of recurrent major depressive disorder, without psychotic features (HCC)  Tobacco use disorder  Chronic neck pain  Other chronic pain  Vision disturbance  Abnormal urinalysis -     GC/Chlamydia Probe Amp([facility]) -     Urine Culture  Varicocele  Need for HPV vaccine   Follow-up pending labs, yearly for physical

## 2023-10-22 LAB — COMPREHENSIVE METABOLIC PANEL
ALT: 9 IU/L (ref 0–44)
AST: 16 IU/L (ref 0–40)
Albumin: 4.4 g/dL (ref 4.1–5.1)
Alkaline Phosphatase: 95 IU/L (ref 44–121)
BUN/Creatinine Ratio: 20 (ref 9–20)
BUN: 16 mg/dL (ref 6–20)
CO2: 25 mmol/L (ref 20–29)
Calcium: 9.6 mg/dL (ref 8.7–10.2)
Chloride: 101 mmol/L (ref 96–106)
Creatinine, Ser: 0.8 mg/dL (ref 0.76–1.27)
Globulin, Total: 2.3 g/dL (ref 1.5–4.5)
Glucose: 61 mg/dL — ABNORMAL LOW (ref 70–99)
Potassium: 4.5 mmol/L (ref 3.5–5.2)
Sodium: 138 mmol/L (ref 134–144)
Total Protein: 6.7 g/dL (ref 6.0–8.5)
eGFR: 121 mL/min/{1.73_m2} (ref >59)

## 2023-10-22 LAB — LIPID PANEL
Cholesterol, Total: 166 mg/dL (ref 100–199)
HDL: 46 mg/dL (ref >39)
LDL CALC COMMENT:: 3.6 ratio (ref 0.0–5.0)
LDL Chol Calc (NIH): 102 mg/dL — ABNORMAL HIGH (ref 0–99)
Triglycerides: 100 mg/dL (ref 0–149)
VLDL Cholesterol Cal: 18 mg/dL (ref 5–40)

## 2023-10-22 LAB — CBC WITH DIFFERENTIAL/PLATELET
Basophils Absolute: 0.1 10*3/uL (ref 0.0–0.2)
Basos: 1 %
EOS (ABSOLUTE): 0.9 10*3/uL — ABNORMAL HIGH (ref 0.0–0.4)
Eos: 9 %
Hematocrit: 39.1 % (ref 37.5–51.0)
Hemoglobin: 13 g/dL (ref 13.0–17.7)
Immature Grans (Abs): 0 10*3/uL (ref 0.0–0.1)
Immature Granulocytes: 0 %
Lymphocytes Absolute: 3.2 10*3/uL — ABNORMAL HIGH (ref 0.7–3.1)
Lymphs: 34 %
MCH: 31 pg (ref 26.6–33.0)
MCHC: 33.2 g/dL (ref 31.5–35.7)
MCV: 93 fL (ref 79–97)
Monocytes Absolute: 0.7 10*3/uL (ref 0.1–0.9)
Monocytes: 7 %
Neutrophils Absolute: 4.6 10*3/uL (ref 1.4–7.0)
Neutrophils: 49 %
Platelets: 355 10*3/uL (ref 150–450)
RBC: 4.2 x10E6/uL (ref 4.14–5.80)
RDW: 12.9 % (ref 11.6–15.4)
WBC: 9.3 10*3/uL (ref 3.4–10.8)

## 2023-10-22 LAB — CK: Total CK: 130 U/L (ref 49–439)

## 2023-10-22 LAB — TSH+FREE T4
Free T4: 1.03 ng/dL (ref 0.82–1.77)
TSH: 0.763 u[IU]/mL (ref 0.450–4.500)

## 2023-10-22 LAB — SEDIMENTATION RATE: Sed Rate: 8 mm/h (ref 0–15)

## 2023-10-23 LAB — URINE CULTURE: Organism ID, Bacteria: NO GROWTH

## 2023-10-24 ENCOUNTER — Encounter: Payer: Self-pay | Admitting: Internal Medicine

## 2023-10-24 LAB — GC/CHLAMYDIA PROBE AMP
Chlamydia trachomatis, NAA: NEGATIVE
Neisseria Gonorrhoeae by PCR: NEGATIVE

## 2023-10-24 NOTE — Progress Notes (Signed)
Labs show normal liver, kidney, electrolytes, normal blood counts, except eosinophil slightly elevated which can be a marker of allergy or inflammation.  Cholesterol okay.  Urine culture negative for infection.  Thyroid normal.  CK muscle marker normal.  Sed rate marker for inflammation normal.  Overall nothing crazy about labs except blood sugar been little low which could just be from fasting  Blood sugar little low.  How long had he been fasting prior to this lab?  Did he eat dinner the night before or breakfast that morning?  Looking back over all of his concerns, I recommend the following:  He sees pain management.  All pain related medications including muscle relaxer and anti-inflammatory should be directed by his pain management clinic.  We do not want to jeopardize his standing with them or jeopardize his pain management contract.  This muscle relaxers and anti-inflammatories or any other pain medicine should be managed by his pain doctor  I recommend follow-up with neurology, Dr. Everlena Cooper about chronic headaches  He did have an abnormal hearing screen.  If he would like we can refer him to audiology  Follow-up with your eye doctor given some vision concerns.  Follow-up with psychiatry as usual  Send copy of office note to [facility] pain clinic and his psychiatrist office

## 2023-10-26 DIAGNOSIS — Z79899 Other long term (current) drug therapy: Secondary | ICD-10-CM | POA: Diagnosis not present

## 2023-10-28 ENCOUNTER — Other Ambulatory Visit (HOSPITAL_COMMUNITY): Payer: Self-pay

## 2023-11-04 DIAGNOSIS — Z79899 Other long term (current) drug therapy: Secondary | ICD-10-CM | POA: Diagnosis not present

## 2023-11-08 DIAGNOSIS — Z79899 Other long term (current) drug therapy: Secondary | ICD-10-CM | POA: Diagnosis not present

## 2023-11-21 ENCOUNTER — Other Ambulatory Visit (HOSPITAL_COMMUNITY): Payer: Self-pay

## 2023-11-22 ENCOUNTER — Other Ambulatory Visit (HOSPITAL_COMMUNITY): Payer: Self-pay

## 2023-11-22 ENCOUNTER — Other Ambulatory Visit: Payer: Self-pay

## 2023-11-22 MED ORDER — ALPRAZOLAM 0.5 MG PO TABS
0.5000 mg | ORAL_TABLET | Freq: Two times a day (BID) | ORAL | 0 refills | Status: AC | PRN
  Filled 2023-11-22: qty 180, 90d supply, fill #0
  Filled 2024-01-17: qty 60, 30d supply, fill #0
  Filled 2024-02-14: qty 60, 30d supply, fill #1
  Filled 2024-03-14: qty 60, 30d supply, fill #2

## 2023-11-22 MED ORDER — ALPRAZOLAM ER 2 MG PO TB24
2.0000 mg | ORAL_TABLET | Freq: Every day | ORAL | 0 refills | Status: AC
  Filled 2023-11-22 – 2023-12-14 (×2): qty 90, 90d supply, fill #0
  Filled 2023-12-20: qty 30, 30d supply, fill #0
  Filled 2024-04-11: qty 30, 30d supply, fill #1
  Filled 2024-05-09: qty 30, 30d supply, fill #2

## 2023-11-29 DIAGNOSIS — Z79899 Other long term (current) drug therapy: Secondary | ICD-10-CM | POA: Diagnosis not present

## 2023-12-01 ENCOUNTER — Other Ambulatory Visit (HOSPITAL_COMMUNITY): Payer: Self-pay

## 2023-12-01 MED ORDER — CLONIDINE HCL 0.2 MG PO TABS
0.2000 mg | ORAL_TABLET | Freq: Every day | ORAL | 0 refills | Status: DC
  Filled 2023-12-01: qty 90, 90d supply, fill #0

## 2023-12-01 MED ORDER — QUETIAPINE FUMARATE 300 MG PO TABS
300.0000 mg | ORAL_TABLET | Freq: Every day | ORAL | 0 refills | Status: DC
  Filled 2023-12-01: qty 90, 90d supply, fill #0

## 2023-12-01 MED ORDER — DULOXETINE HCL 60 MG PO CPEP
120.0000 mg | ORAL_CAPSULE | Freq: Every morning | ORAL | 0 refills | Status: DC
  Filled 2023-12-01: qty 180, 90d supply, fill #0

## 2023-12-02 ENCOUNTER — Other Ambulatory Visit: Payer: Self-pay

## 2023-12-02 DIAGNOSIS — Z79899 Other long term (current) drug therapy: Secondary | ICD-10-CM | POA: Diagnosis not present

## 2023-12-05 ENCOUNTER — Telehealth: Payer: Commercial Managed Care - PPO | Admitting: Physician Assistant

## 2023-12-05 DIAGNOSIS — B9689 Other specified bacterial agents as the cause of diseases classified elsewhere: Secondary | ICD-10-CM | POA: Diagnosis not present

## 2023-12-05 DIAGNOSIS — J019 Acute sinusitis, unspecified: Secondary | ICD-10-CM | POA: Diagnosis not present

## 2023-12-05 MED ORDER — AMOXICILLIN-POT CLAVULANATE 875-125 MG PO TABS
1.0000 | ORAL_TABLET | Freq: Two times a day (BID) | ORAL | 0 refills | Status: DC
  Filled 2023-12-05: qty 14, 7d supply, fill #0

## 2023-12-05 NOTE — Progress Notes (Signed)

## 2023-12-06 ENCOUNTER — Other Ambulatory Visit: Payer: Self-pay

## 2023-12-06 ENCOUNTER — Other Ambulatory Visit (HOSPITAL_COMMUNITY): Payer: Self-pay

## 2023-12-07 DIAGNOSIS — Z79899 Other long term (current) drug therapy: Secondary | ICD-10-CM | POA: Diagnosis not present

## 2023-12-09 DIAGNOSIS — Z79899 Other long term (current) drug therapy: Secondary | ICD-10-CM | POA: Diagnosis not present

## 2023-12-13 ENCOUNTER — Other Ambulatory Visit (HOSPITAL_COMMUNITY): Payer: Self-pay

## 2023-12-13 DIAGNOSIS — F4312 Post-traumatic stress disorder, chronic: Secondary | ICD-10-CM | POA: Diagnosis not present

## 2023-12-13 DIAGNOSIS — F3181 Bipolar II disorder: Secondary | ICD-10-CM | POA: Diagnosis not present

## 2023-12-13 MED ORDER — DULOXETINE HCL 60 MG PO CPEP
120.0000 mg | ORAL_CAPSULE | Freq: Every morning | ORAL | 0 refills | Status: AC
  Filled 2023-12-13 – 2024-02-13 (×3): qty 180, 90d supply, fill #0

## 2023-12-13 MED ORDER — ALPRAZOLAM ER 2 MG PO TB24
2.0000 mg | ORAL_TABLET | Freq: Every day | ORAL | 0 refills | Status: AC
  Filled 2023-12-13: qty 90, 90d supply, fill #0
  Filled 2024-01-17: qty 30, 30d supply, fill #0
  Filled 2024-02-14: qty 30, 30d supply, fill #1
  Filled 2024-03-14: qty 30, 30d supply, fill #2

## 2023-12-13 MED ORDER — CARISOPRODOL 350 MG PO TABS
350.0000 mg | ORAL_TABLET | Freq: Every day | ORAL | 0 refills | Status: DC | PRN
  Filled 2023-12-13: qty 30, 30d supply, fill #0

## 2023-12-13 MED ORDER — QUETIAPINE FUMARATE 300 MG PO TABS
300.0000 mg | ORAL_TABLET | Freq: Every day | ORAL | 0 refills | Status: AC
  Filled 2023-12-13: qty 30, 30d supply, fill #0
  Filled 2024-01-23: qty 30, 30d supply, fill #1
  Filled 2024-02-21: qty 30, 30d supply, fill #2

## 2023-12-13 MED ORDER — CYCLOBENZAPRINE HCL 10 MG PO TABS
10.0000 mg | ORAL_TABLET | Freq: Every day | ORAL | 0 refills | Status: DC
  Filled 2023-12-13: qty 30, 30d supply, fill #0

## 2023-12-13 MED ORDER — CLONIDINE HCL 0.2 MG PO TABS
0.2000 mg | ORAL_TABLET | Freq: Every day | ORAL | 0 refills | Status: DC
  Filled 2023-12-13 – 2024-03-02 (×2): qty 90, 90d supply, fill #0

## 2023-12-13 MED ORDER — DRONABINOL 10 MG PO CAPS
10.0000 mg | ORAL_CAPSULE | Freq: Two times a day (BID) | ORAL | 3 refills | Status: DC
  Filled 2023-12-13: qty 60, 30d supply, fill #0
  Filled 2024-03-14 (×2): qty 60, 30d supply, fill #1
  Filled 2024-04-10 – 2024-04-11 (×2): qty 60, 30d supply, fill #2
  Filled 2024-05-31: qty 60, 30d supply, fill #3

## 2023-12-13 MED ORDER — ALPRAZOLAM 0.5 MG PO TABS
0.5000 mg | ORAL_TABLET | Freq: Two times a day (BID) | ORAL | 0 refills | Status: DC | PRN
  Filled 2023-12-13 – 2023-12-14 (×2): qty 180, 90d supply, fill #0
  Filled 2023-12-20: qty 60, 30d supply, fill #0
  Filled 2024-04-11: qty 60, 30d supply, fill #1
  Filled 2024-05-09: qty 60, 30d supply, fill #2

## 2023-12-14 ENCOUNTER — Other Ambulatory Visit (HOSPITAL_COMMUNITY): Payer: Self-pay

## 2023-12-14 ENCOUNTER — Other Ambulatory Visit: Payer: Self-pay

## 2023-12-14 DIAGNOSIS — R519 Headache, unspecified: Secondary | ICD-10-CM | POA: Diagnosis not present

## 2023-12-15 ENCOUNTER — Other Ambulatory Visit (HOSPITAL_COMMUNITY): Payer: Self-pay

## 2023-12-20 ENCOUNTER — Other Ambulatory Visit (HOSPITAL_COMMUNITY): Payer: Self-pay

## 2023-12-23 ENCOUNTER — Other Ambulatory Visit (HOSPITAL_COMMUNITY): Payer: Self-pay
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
## 2023-12-28 ENCOUNTER — Telehealth: Payer: Commercial Managed Care - PPO | Admitting: Neurology

## 2024-01-02 ENCOUNTER — Other Ambulatory Visit (HOSPITAL_COMMUNITY): Payer: Self-pay

## 2024-01-04 ENCOUNTER — Other Ambulatory Visit (HOSPITAL_COMMUNITY): Payer: Self-pay

## 2024-01-04 DIAGNOSIS — Z79899 Other long term (current) drug therapy: Secondary | ICD-10-CM | POA: Diagnosis not present

## 2024-01-09 DIAGNOSIS — Z79899 Other long term (current) drug therapy: Secondary | ICD-10-CM | POA: Diagnosis not present

## 2024-01-13 ENCOUNTER — Other Ambulatory Visit (HOSPITAL_COMMUNITY): Payer: Self-pay

## 2024-01-16 ENCOUNTER — Other Ambulatory Visit (HOSPITAL_COMMUNITY): Payer: Self-pay
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
## 2024-01-17 ENCOUNTER — Ambulatory Visit (INDEPENDENT_AMBULATORY_CARE_PROVIDER_SITE_OTHER): Payer: Commercial Managed Care - PPO | Admitting: Neurology

## 2024-01-17 ENCOUNTER — Encounter: Payer: Self-pay | Admitting: Neurology

## 2024-01-17 ENCOUNTER — Other Ambulatory Visit (HOSPITAL_COMMUNITY): Payer: Self-pay

## 2024-01-17 VITALS — BP 108/73 | HR 111 | Ht 75.0 in | Wt 195.0 lb

## 2024-01-17 DIAGNOSIS — G43711 Chronic migraine without aura, intractable, with status migrainosus: Secondary | ICD-10-CM

## 2024-01-17 MED ORDER — SUMATRIPTAN SUCCINATE 100 MG PO TABS
100.0000 mg | ORAL_TABLET | ORAL | 5 refills | Status: AC
  Filled 2024-01-17 (×2): qty 10, 30d supply, fill #0

## 2024-01-17 MED ORDER — AIMOVIG 140 MG/ML ~~LOC~~ SOAJ
140.0000 mg | SUBCUTANEOUS | 5 refills | Status: DC
Start: 2024-01-17 — End: 2024-07-31
  Filled 2024-01-17 – 2024-02-13 (×2): qty 1, 28d supply, fill #0
  Filled 2024-02-14: qty 1, 30d supply, fill #0
  Filled 2024-03-12: qty 1, 30d supply, fill #1
  Filled 2024-04-10: qty 1, 30d supply, fill #2
  Filled 2024-05-31: qty 1, 30d supply, fill #3
  Filled 2024-07-10: qty 1, 30d supply, fill #4

## 2024-01-17 NOTE — Progress Notes (Signed)
 Medication Samples have been provided to the patient.  Drug name: Aimovig       Strength: 140 mg        Qty: 1   LOT: [id number]  Exp.Date: 06/07/24  Dosing instructions: monthly  The patient has been instructed regarding the correct time, dose, and frequency of taking this medication, including desired effects and most common side effects.   Carl Hughes 3:17 PM 01/17/2024

## 2024-01-17 NOTE — Patient Instructions (Signed)
  Start Aimovig 140mg  injection every 28 days.   Take sumatriptan at earliest onset of headache.  May repeat dose once in 2 hours if needed.  Maximum 2 tablets in 24 hours. Limit use of pain relievers to no more than 2 days out of the week.  These medications include acetaminophen, NSAIDs (ibuprofen/Advil/Motrin, naproxen/Aleve, triptans (Imitrex/sumatriptan), Excedrin, and narcotics.  This will help reduce risk of rebound headaches. Be aware of common food triggers Routine exercise Stay adequately hydrated (aim for 64 oz water daily) Keep headache diary Maintain proper stress management Maintain proper sleep hygiene Do not skip meals Consider supplements:  magnesium citrate 400mg  daily, riboflavin 400mg  daily, coenzyme Q10 300mg  daily

## 2024-01-18 ENCOUNTER — Telehealth: Payer: Self-pay | Admitting: Pharmacist

## 2024-01-18 NOTE — Telephone Encounter (Signed)
 Pharmacy Patient Advocate Encounter   Received notification from Patient Pharmacy that prior authorization for Aimovig 140MG /ML auto-injectors is required/requested.   Insurance verification completed.   The patient is insured through [facility] .   Per test claim: PA required; PA submitted to above mentioned insurance via CoverMyMeds Key/confirmation #/EOC [id number] Status is pending

## 2024-01-20 ENCOUNTER — Other Ambulatory Visit (HOSPITAL_COMMUNITY): Payer: Self-pay

## 2024-01-23 ENCOUNTER — Other Ambulatory Visit (HOSPITAL_COMMUNITY): Payer: Self-pay

## 2024-01-24 NOTE — Telephone Encounter (Signed)
 Pharmacy Patient Advocate Encounter  Received notification from [facility] that Prior Authorization for Aimovig 140MG /ML auto-injectors has been APPROVED from 01/20/2024 to 07/18/2024   PA #/Case ID/Reference #: [id number]

## 2024-01-25 ENCOUNTER — Other Ambulatory Visit (HOSPITAL_COMMUNITY): Payer: Self-pay

## 2024-02-02 DIAGNOSIS — Z79899 Other long term (current) drug therapy: Secondary | ICD-10-CM | POA: Diagnosis not present

## 2024-02-06 ENCOUNTER — Other Ambulatory Visit (HOSPITAL_COMMUNITY): Payer: Self-pay

## 2024-02-06 DIAGNOSIS — Z79899 Other long term (current) drug therapy: Secondary | ICD-10-CM | POA: Diagnosis not present

## 2024-02-06 MED ORDER — OXYCODONE-ACETAMINOPHEN 10-325 MG PO TABS
1.0000 | ORAL_TABLET | Freq: Every day | ORAL | 0 refills | Status: DC
  Filled 2024-02-06: qty 130, 26d supply, fill #0

## 2024-02-13 ENCOUNTER — Other Ambulatory Visit (HOSPITAL_COMMUNITY): Payer: Self-pay

## 2024-02-14 ENCOUNTER — Other Ambulatory Visit (HOSPITAL_COMMUNITY): Payer: Self-pay

## 2024-02-14 ENCOUNTER — Other Ambulatory Visit: Payer: Self-pay

## 2024-02-21 ENCOUNTER — Other Ambulatory Visit (HOSPITAL_COMMUNITY): Payer: Self-pay

## 2024-03-01 ENCOUNTER — Other Ambulatory Visit (HOSPITAL_COMMUNITY): Payer: Self-pay

## 2024-03-01 DIAGNOSIS — Z79899 Other long term (current) drug therapy: Secondary | ICD-10-CM | POA: Diagnosis not present

## 2024-03-01 MED ORDER — NALOXONE HCL 4 MG/0.1ML NA LIQD
1.0000 | NASAL | 0 refills | Status: DC
  Filled 2024-03-01: qty 2, 15d supply, fill #0

## 2024-03-02 ENCOUNTER — Other Ambulatory Visit (HOSPITAL_COMMUNITY): Payer: Self-pay

## 2024-03-02 MED ORDER — CARISOPRODOL 350 MG PO TABS
175.0000 mg | ORAL_TABLET | Freq: Two times a day (BID) | ORAL | 0 refills | Status: DC
  Filled 2024-03-02: qty 30, 30d supply, fill #0

## 2024-03-02 MED ORDER — OXYCODONE-ACETAMINOPHEN 10-325 MG PO TABS
1.0000 | ORAL_TABLET | Freq: Every day | ORAL | 0 refills | Status: AC
  Filled 2024-03-02: qty 30, 6d supply, fill #0
  Filled 2024-03-02: qty 150, 30d supply, fill #0

## 2024-03-05 ENCOUNTER — Other Ambulatory Visit (HOSPITAL_COMMUNITY): Payer: Self-pay

## 2024-03-06 DIAGNOSIS — Z79899 Other long term (current) drug therapy: Secondary | ICD-10-CM | POA: Diagnosis not present

## 2024-03-14 ENCOUNTER — Other Ambulatory Visit: Payer: Self-pay

## 2024-03-14 ENCOUNTER — Other Ambulatory Visit (HOSPITAL_COMMUNITY): Payer: Self-pay

## 2024-03-20 ENCOUNTER — Other Ambulatory Visit (HOSPITAL_COMMUNITY): Payer: Self-pay

## 2024-03-20 MED ORDER — QUETIAPINE FUMARATE 300 MG PO TABS
300.0000 mg | ORAL_TABLET | Freq: Every evening | ORAL | 0 refills | Status: AC
  Filled 2024-03-20: qty 90, 90d supply, fill #0

## 2024-03-21 ENCOUNTER — Other Ambulatory Visit (HOSPITAL_COMMUNITY): Payer: Self-pay

## 2024-03-22 ENCOUNTER — Encounter (HOSPITAL_COMMUNITY): Payer: Self-pay

## 2024-03-22 ENCOUNTER — Other Ambulatory Visit (HOSPITAL_COMMUNITY): Payer: Self-pay

## 2024-03-27 ENCOUNTER — Other Ambulatory Visit (HOSPITAL_COMMUNITY): Payer: Self-pay

## 2024-03-29 ENCOUNTER — Other Ambulatory Visit (HOSPITAL_COMMUNITY): Payer: Self-pay

## 2024-03-29 DIAGNOSIS — Z79899 Other long term (current) drug therapy: Secondary | ICD-10-CM | POA: Diagnosis not present

## 2024-03-29 MED ORDER — DICLOFENAC SODIUM 75 MG PO TBEC
75.0000 mg | DELAYED_RELEASE_TABLET | Freq: Two times a day (BID) | ORAL | 0 refills | Status: DC
  Filled 2024-03-29: qty 60, 30d supply, fill #0

## 2024-03-29 MED ORDER — NALOXONE HCL 4 MG/0.1ML NA LIQD
1.0000 | NASAL | 0 refills | Status: AC
  Filled 2024-03-29: qty 2, 30d supply, fill #0

## 2024-03-29 MED ORDER — CARISOPRODOL 350 MG PO TABS
175.0000 mg | ORAL_TABLET | Freq: Two times a day (BID) | ORAL | 0 refills | Status: DC
  Filled 2024-03-29 – 2024-04-10 (×2): qty 30, 30d supply, fill #0

## 2024-03-29 MED ORDER — OXYCODONE-ACETAMINOPHEN 10-325 MG PO TABS
1.0000 | ORAL_TABLET | Freq: Every day | ORAL | 0 refills | Status: DC | PRN
  Filled 2024-03-29: qty 150, 30d supply, fill #0

## 2024-03-30 ENCOUNTER — Other Ambulatory Visit: Payer: Self-pay

## 2024-03-30 ENCOUNTER — Other Ambulatory Visit (HOSPITAL_COMMUNITY): Payer: Self-pay

## 2024-04-02 DIAGNOSIS — Z79899 Other long term (current) drug therapy: Secondary | ICD-10-CM | POA: Diagnosis not present

## 2024-04-09 ENCOUNTER — Other Ambulatory Visit (HOSPITAL_COMMUNITY): Payer: Self-pay

## 2024-04-10 ENCOUNTER — Other Ambulatory Visit: Payer: Self-pay

## 2024-04-10 ENCOUNTER — Other Ambulatory Visit (HOSPITAL_COMMUNITY): Payer: Self-pay

## 2024-04-11 ENCOUNTER — Other Ambulatory Visit: Payer: Self-pay

## 2024-04-30 ENCOUNTER — Other Ambulatory Visit (HOSPITAL_COMMUNITY): Payer: Self-pay

## 2024-04-30 DIAGNOSIS — Z79899 Other long term (current) drug therapy: Secondary | ICD-10-CM | POA: Diagnosis not present

## 2024-04-30 DIAGNOSIS — E7800 Pure hypercholesterolemia, unspecified: Secondary | ICD-10-CM | POA: Diagnosis not present

## 2024-04-30 DIAGNOSIS — E559 Vitamin D deficiency, unspecified: Secondary | ICD-10-CM | POA: Diagnosis not present

## 2024-04-30 MED ORDER — OXYCODONE-ACETAMINOPHEN 10-325 MG PO TABS
1.0000 | ORAL_TABLET | ORAL | 0 refills | Status: DC | PRN
  Filled 2024-04-30: qty 150, 30d supply, fill #0

## 2024-04-30 MED ORDER — CARISOPRODOL 350 MG PO TABS: 175.0000 mg | ORAL_TABLET | Freq: Two times a day (BID) | ORAL | 0 refills | Status: AC

## 2024-04-30 MED ORDER — DICLOFENAC SODIUM 75 MG PO TBEC
75.0000 mg | DELAYED_RELEASE_TABLET | Freq: Two times a day (BID) | ORAL | 0 refills | Status: DC
  Filled 2024-04-30: qty 60, 30d supply, fill #0

## 2024-04-30 MED ORDER — NALOXONE HCL 4 MG/0.1ML NA LIQD
1.0000 | NASAL | 0 refills | Status: AC | PRN
  Filled 2024-04-30: qty 2, 30d supply, fill #0

## 2024-05-07 ENCOUNTER — Other Ambulatory Visit (HOSPITAL_COMMUNITY): Payer: Self-pay

## 2024-05-07 DIAGNOSIS — Z79899 Other long term (current) drug therapy: Secondary | ICD-10-CM | POA: Diagnosis not present

## 2024-05-09 ENCOUNTER — Other Ambulatory Visit: Payer: Self-pay

## 2024-05-25 ENCOUNTER — Other Ambulatory Visit (HOSPITAL_COMMUNITY): Payer: Self-pay

## 2024-05-28 ENCOUNTER — Other Ambulatory Visit (HOSPITAL_COMMUNITY): Payer: Self-pay

## 2024-05-28 MED ORDER — CLONIDINE HCL 0.2 MG PO TABS
0.2000 mg | ORAL_TABLET | Freq: Every day | ORAL | 0 refills | Status: DC
  Filled 2024-05-28: qty 90, 90d supply, fill #0

## 2024-05-29 ENCOUNTER — Other Ambulatory Visit: Payer: Self-pay

## 2024-05-29 ENCOUNTER — Other Ambulatory Visit (HOSPITAL_COMMUNITY): Payer: Self-pay

## 2024-05-29 DIAGNOSIS — Z79899 Other long term (current) drug therapy: Secondary | ICD-10-CM | POA: Diagnosis not present

## 2024-05-29 MED ORDER — DICLOFENAC SODIUM 75 MG PO TBEC
75.0000 mg | DELAYED_RELEASE_TABLET | Freq: Two times a day (BID) | ORAL | 0 refills | Status: DC
  Filled 2024-05-29: qty 60, 30d supply, fill #0

## 2024-05-29 MED ORDER — CARISOPRODOL 350 MG PO TABS
175.0000 mg | ORAL_TABLET | Freq: Two times a day (BID) | ORAL | 0 refills | Status: AC
  Filled 2024-05-29: qty 30, 30d supply, fill #0

## 2024-05-29 MED ORDER — NALOXONE HCL 4 MG/0.1ML NA LIQD
1.0000 | Freq: Once | NASAL | 0 refills | Status: AC
  Filled 2024-05-29: qty 2, 15d supply, fill #0

## 2024-05-29 MED ORDER — OXYCODONE-ACETAMINOPHEN 10-325 MG PO TABS
1.0000 | ORAL_TABLET | ORAL | 0 refills | Status: DC | PRN
  Filled 2024-05-29: qty 10, 2d supply, fill #0
  Filled 2024-05-29: qty 140, 28d supply, fill #0

## 2024-05-30 ENCOUNTER — Other Ambulatory Visit: Payer: Self-pay

## 2024-05-30 ENCOUNTER — Other Ambulatory Visit (HOSPITAL_COMMUNITY): Payer: Self-pay

## 2024-05-31 ENCOUNTER — Other Ambulatory Visit (HOSPITAL_COMMUNITY): Payer: Self-pay

## 2024-05-31 ENCOUNTER — Other Ambulatory Visit: Payer: Self-pay

## 2024-05-31 MED ORDER — DULOXETINE HCL 60 MG PO CPEP
120.0000 mg | ORAL_CAPSULE | Freq: Every morning | ORAL | 0 refills | Status: DC
  Filled 2024-05-31: qty 180, 90d supply, fill #0

## 2024-05-31 MED ORDER — ALPRAZOLAM ER 2 MG PO TB24
2.0000 mg | ORAL_TABLET | Freq: Every evening | ORAL | 0 refills | Status: DC
  Filled 2024-05-31: qty 90, 90d supply, fill #0
  Filled 2024-06-14: qty 30, 30d supply, fill #0
  Filled 2024-07-16: qty 30, 30d supply, fill #1
  Filled 2024-08-13: qty 30, 30d supply, fill #2

## 2024-05-31 MED ORDER — ALPRAZOLAM 0.5 MG PO TABS
0.5000 mg | ORAL_TABLET | Freq: Two times a day (BID) | ORAL | 0 refills | Status: DC | PRN
  Filled 2024-05-31: qty 180, 90d supply, fill #0
  Filled 2024-06-14: qty 60, 30d supply, fill #0
  Filled 2024-07-16: qty 60, 30d supply, fill #1
  Filled 2024-08-13: qty 60, 30d supply, fill #2

## 2024-06-05 ENCOUNTER — Telehealth: Admitting: Physician Assistant

## 2024-06-05 ENCOUNTER — Other Ambulatory Visit (HOSPITAL_COMMUNITY): Payer: Self-pay

## 2024-06-05 DIAGNOSIS — L237 Allergic contact dermatitis due to plants, except food: Secondary | ICD-10-CM

## 2024-06-05 MED ORDER — PREDNISONE 10 MG (21) PO TBPK: ORAL_TABLET | ORAL | 0 refills | Status: DC

## 2024-06-05 MED ORDER — TRIAMCINOLONE ACETONIDE 0.1 % EX CREA: 1.0000 | TOPICAL_CREAM | Freq: Two times a day (BID) | CUTANEOUS | 0 refills | Status: DC

## 2024-06-05 NOTE — Patient Instructions (Signed)
 Carl Hughes, thank you for joining [name], PA-C for today's virtual visit.  While this provider is not your primary care provider (PCP), if your PCP is located in our provider database this encounter information will be shared with them immediately following your visit.   A [facility] MyChart account gives you access to today's visit and all your visits, tests, and labs performed at [facility]  click here if you don't have a [facility] MyChart account or go to [email or web address]  Consent: (Patient) Carl Hughes provided verbal consent for this virtual visit at the beginning of the encounter.  Current Medications:  Current Outpatient Medications:    predniSONE  (STERAPRED UNI-PAK 21 TAB) 10 MG (21) TBPK tablet, Take following package directions, Disp: 21 tablet, Rfl: 0   triamcinolone  cream (KENALOG ) 0.1 %, Apply 1 Application topically 2 (two) times daily., Disp: 30 g, Rfl: 0   ALPRAZolam  (XANAX  XR) 2 MG 24 hr tablet, Take 1 tablet (2 mg total) by mouth at bedtime., Disp: 90 tablet, Rfl: 1   ALPRAZolam  (XANAX  XR) 2 MG 24 hr tablet, Take 1 tablet (2 mg total) by mouth at bedtime., Disp: 90 tablet, Rfl: 0   ALPRAZolam  (XANAX  XR) 2 MG 24 hr tablet, Take 1 tablet (2 mg total) by mouth at bedtime., Disp: 90 tablet, Rfl: 0   ALPRAZolam  (XANAX  XR) 2 MG 24 hr tablet, Take 1 tablet (2 mg total) by mouth at bedtime., Disp: 90 tablet, Rfl: 0   ALPRAZolam  (XANAX  XR) 2 MG 24 hr tablet, Take 1 tablet (2 mg total) by mouth at bedtime., Disp: 90 tablet, Rfl: 0   ALPRAZolam  (XANAX ) 0.5 MG tablet, Take 1 tablet (0.5 mg total) by mouth 2 (two) times daily as needed. (Patient taking differently: Take 0.5 mg by mouth 2 (two) times daily as needed for anxiety.), Disp: 180 tablet, Rfl: 1   ALPRAZolam  (XANAX ) 0.5 MG tablet, Take 1 tablet (0.5 mg total) by mouth 2 (two) times daily as needed., Disp: 180 tablet, Rfl: 0   ALPRAZolam  (XANAX ) 0.5 MG tablet, Take 1 tablet (0.5 mg total) by  mouth 2 (two) times daily as needed., Disp: 180 tablet, Rfl: 0   ALPRAZolam  (XANAX ) 0.5 MG tablet, Take 1 tablet (0.5 mg total) by mouth 2 (two) times daily as needed., Disp: 180 tablet, Rfl: 0   carisoprodol  (SOMA ) 350 MG tablet, Take 1 tablet (350 mg total) by mouth daily if needed, Disp: 90 tablet, Rfl: 0   carisoprodol  (SOMA ) 350 MG tablet, Take 1 tablet (350 mg total) by mouth daily as needed., Disp: 90 tablet, Rfl: 0   carisoprodol  (SOMA ) 350 MG tablet, Take 1/2 tablet (175 mg total) by mouth 2 (two) times daily., Disp: 30 tablet, Rfl: 0   carisoprodol  (SOMA ) 350 MG tablet, Take 1/2 tablets (175 mg total) by mouth 2 (two) times daily., Disp: 30 tablet, Rfl: 0   cloNIDine  (CATAPRES ) 0.2 MG tablet, Take 1 tablet (0.2 mg total) by mouth at bedtime., Disp: 90 tablet, Rfl: 0   cyclobenzaprine  (FLEXERIL ) 10 MG tablet, Take 1 tablet (10 mg total) by mouth at bedtime., Disp: 90 tablet, Rfl: 1   diclofenac  (VOLTAREN ) 75 MG EC tablet, Take 1 tablet (75 mg total) by mouth every 12 (twelve) hours., Disp: 60 tablet, Rfl: 0   diclofenac  (VOLTAREN ) 75 MG EC tablet, Take 1 tablet (75 mg total) by mouth 2 (two) times daily., Disp: 60 tablet, Rfl: 0   dronabinol  (MARINOL ) 10 MG capsule, Take 1 capsule (10  mg total) by mouth 2 (two) times daily., Disp: 60 capsule, Rfl: 3   dronabinol  (MARINOL ) 5 MG capsule, Take 1 capsule (5 mg total) by mouth 2 (two) times daily., Disp: 60 capsule, Rfl: 2   DULoxetine  (CYMBALTA ) 60 MG capsule, Take 2 capsules (120 mg total) by mouth every morning., Disp: 180 capsule, Rfl: 0   DULoxetine  (CYMBALTA ) 60 MG capsule, Take 2 capsules (120 mg total) by mouth in the morning., Disp: 180 capsule, Rfl: 0   Erenumab -aooe (AIMOVIG ) 140 MG/ML SOAJ, Inject 140 mg into the skin every 28 (twenty-eight) days., Disp: 1 mL, Rfl: 5   lidocaine  (LIDODERM ) 5 %, Place 1 patch onto the skin daily. Remove & Discard patch within 12 hours or as directed by MD, Disp: 90 patch, Rfl: 0   naloxone  (NARCAN )  nasal spray 4 mg/0.1 mL, Place 1 spray into the nose as directed (Patient not taking: Reported on 01/17/2024), Disp: 2 each, Rfl: 0   naloxone  (NARCAN ) nasal spray 4 mg/0.1 mL, Place 1 spray into one nostril once. Call 911 and repeat second dose in alternate nostril if no response., Disp: 2 each, Rfl: 0   naloxone  (NARCAN ) nasal spray 4 mg/0.1 mL, Place 1 spray into the nose as needed as directed., Disp: 2 each, Rfl: 0   oxyCODONE -acetaminophen  (PERCOCET) 10-325 MG tablet, Take 1 tablet by mouth 5 (five) times daily., Disp: 150 tablet, Rfl: 0   oxyCODONE -acetaminophen  (PERCOCET) 10-325 MG tablet, Take 1 tablet by mouth every 5 (five) hours as needed., Disp: 150 tablet, Rfl: 0   oxyCODONE -acetaminophen  (PERCOCET) 5-325 MG tablet, Take 1 tablet by mouth every 6 (six) hours., Disp: 120 tablet, Rfl: 0   QUEtiapine  (SEROQUEL ) 300 MG tablet, Take 1 tablet (300 mg total) by mouth at bedtime., Disp: 90 tablet, Rfl: 0   QUEtiapine  (SEROQUEL ) 300 MG tablet, Take 1 tablet (300 mg total) by mouth at bedtime., Disp: 90 tablet, Rfl: 0   SUMAtriptan  (IMITREX ) 100 MG tablet, Take 1 tablet at earliest onset of headache.  May repeat in 2 hours if headache persists or recurs.  Maximum 2 tablets in 24 hours., Disp: 10 tablet, Rfl: 5   tiZANidine  (ZANAFLEX ) 4 MG tablet, Take 1 tablet (4 mg total) by mouth at bedtime as needed for muscle spasms. (Patient not taking: Reported on 10/21/2023), Disp: 30 tablet, Rfl: 0   Medications ordered in this encounter:  Meds ordered this encounter  Medications   triamcinolone  cream (KENALOG ) 0.1 %    Sig: Apply 1 Application topically 2 (two) times daily.    Dispense:  30 g    Refill:  0    Supervising Provider:   BLAISE ALEENE KIDD [[id number]]   predniSONE  (STERAPRED UNI-PAK 21 TAB) 10 MG (21) TBPK tablet    Sig: Take following package directions    Dispense:  21 tablet    Refill:  0    Supervising Provider:   BLAISE ALEENE KIDD [[id number]]     *If you need refills on other  medications prior to your next appointment, please contact your pharmacy*  Follow-Up: Call back or seek an in-person evaluation if the symptoms worsen or if the condition fails to improve as anticipated.  [facility] [phone]  Other Instructions Poison Ivy Dermatitis Poison ivy dermatitis is redness and soreness of the skin caused by chemicals in the leaves of the poison ivy plant. You may have very bad itching, swelling, a rash, and blisters. What are the causes? Touching a poison ivy plant. Touching something  that has the chemical on it. This may include animals or objects that have come in contact with the plant. What increases the risk? Going outdoors often in wooded or [place] areas. Going outdoors without wearing protective clothing, such as closed shoes, long pants, and a long-sleeved shirt. What are the signs or symptoms?  Skin redness. Very bad itching. A rash that often includes bumps and blisters. The rash usually appears 48 hours after exposure, if you have had it before. If this is the first time you have it, the rash may not appear until a week after exposure. Swelling. This may occur if the reaction is very bad. Symptoms usually last for 1-2 weeks. The first time you get this condition, symptoms may last 3-4 weeks. How is this treated? This condition may be treated with: Hydrocortisone  cream or calamine lotion to relieve itching. Oatmeal baths to soothe the skin. Medicines, such as over-the-counter antihistamine tablets. Oral steroid medicine for very bad reactions. Follow these instructions at home: Medicines Take or apply over-the-counter and prescription medicines only as told by your doctor. Use hydrocortisone  cream or calamine lotion as needed to help with itching. General instructions Do not scratch or rub your skin. Put a cold, wet cloth (cold compress) on the affected areas or take baths in cool water. This will help with itching. Avoid  hot baths and showers. Take oatmeal baths as needed. Use colloidal oatmeal. You can get this at a pharmacy or grocery store. Follow the instructions on the package. While you have the rash, wash your clothes right after you wear them. Check the affected area every day for signs of infection. Check for: More redness, swelling, or pain. Fluid or blood. Warmth. Pus or a bad smell. Keep all follow-up visits. Your doctor may want to see how your skin is doing with treatment. How is this prevented?  Know what poison ivy looks like, so you can avoid it. This plant has three leaves with flowering branches on a single stem. The leaves are glossy. The leaves have uneven edges that come to a point. If you touch poison ivy, wash your skin with soap and water right away. Be sure to wash under your fingernails. When hiking or camping, wear long pants, a long-sleeved shirt, long socks, and hiking boots. You can also use a lotion on your skin that helps to prevent contact with poison ivy. If you think that your clothes or outdoor gear came in contact with poison ivy, rinse them off with a garden hose before you bring them inside your house. When doing yard work or gardening, wear gloves, long sleeves, long pants, and boots. Wash your garden tools and gloves if they come in contact with poison ivy. If you think that your pet has come into contact with poison ivy, wash them with pet shampoo and water. Make sure to wear gloves while washing your pet. Contact a doctor if: You have open sores in the rash area. You have any signs of infection. You have redness that spreads past the rash area. You have a fever. You have a rash over a large area of your body. You have a rash on your eyes, mouth, or genitals. Your rash does not get better after a few weeks. Get help right away if: Your face swells or your eyes swell shut. You have trouble breathing. You have trouble swallowing. These symptoms may be an  emergency. Do not wait to see if the symptoms will go away. Get help right away.  Call 911. This information is not intended to replace advice given to you by your health care provider. Make sure you discuss any questions you have with your health care provider. Document Revised: 03/25/2022 Document Reviewed: 03/25/2022 Elsevier Patient Education  2024 Elsevier Inc.   If you have been instructed to have an in-person evaluation today at a local Urgent Care facility, please use the link below. It will take you to a list of all of our available [facility], including address, phone number and hours of operation. Please do not delay care.  [facility]  If you or a family member do not have a primary care provider, use the link below to schedule a visit and establish care. When you choose a [facility] primary care physician or advanced practice provider, you gain a long-term partner in health. Find a Primary Care Provider  Learn more about [facility]'s in-office and virtual care options: [facility] - Get Care Now

## 2024-06-05 NOTE — Progress Notes (Signed)
 Virtual Visit Consent   Carl Hughes, you are scheduled for a virtual visit with a [facility] provider today. Just as with appointments in the office, your consent must be obtained to participate. Your consent will be active for this visit and any virtual visit you may have with one of our providers in the next 365 days. If you have a MyChart account, a copy of this consent can be sent to you electronically.  As this is a virtual visit, video technology does not allow for your provider to perform a traditional examination. This may limit your provider's ability to fully assess your condition. If your provider identifies any concerns that need to be evaluated in person or the need to arrange testing (such as labs, EKG, etc.), we will make arrangements to do so. Although advances in technology are sophisticated, we cannot ensure that it will always work on either your end or our end. If the connection with a video visit is poor, the visit may have to be switched to a telephone visit. With either a video or telephone visit, we are not always able to ensure that we have a secure connection.  By engaging in this virtual visit, you consent to the provision of healthcare and authorize for your insurance to be billed (if applicable) for the services provided during this visit. Depending on your insurance coverage, you may receive a charge related to this service.  I need to obtain your verbal consent now. Are you willing to proceed with your visit today? Carl Hughes has provided verbal consent on 06/05/2024 for a virtual visit (video or telephone). Carl Hughes, [place]  Date: 06/05/2024 6:18 PM   Virtual Visit via Video Note   I, Carl Hughes, connected with  Carl Hughes  ([id number], Apr 07, 1991) on 06/05/24 at  7:45 PM EDT by a video-enabled telemedicine application and verified that I am speaking with the correct person using two identifiers.  Location: Patient: Virtual Visit Location  Patient: Home Provider: Virtual Visit Location Provider: Home Office   I discussed the limitations of evaluation and management by telemedicine and the availability of in person appointments. The patient expressed understanding and agreed to proceed.    History of Present Illness: Carl Hughes is a 33 y.o. who identifies as a male who was assigned male at birth, and is being seen today for pruritic rash of lower legs bilaterally first noted 3 days ago.  Notes the rash is very itchy and slightly uncomfortable.  Has not noticed it elsewhere, but is encompassing the majority of both of his legs.  Denies fevers, chills or malaise.  Denies change to soaps, lotions or detergents.  Denies recent travel or sick contact.  Does note exposure to poison oak/ivy.  Has been applying a prescription hydrocortisone  cream which is not helping much.  Benadryl  OTC offering minimal relief.  Patient and his mother both note that he has a substantial reaction to poison ivy and typically requires a steroid. HPI: HPI  Problems:  Patient Active Problem List   Diagnosis Date Noted   Encounter for health maintenance examination in adult 10/21/2023   Vaccine counseling 10/21/2023   Encounter for lipid screening for cardiovascular disease 10/21/2023   Decreased hearing of both ears 10/21/2023   Pain in both lower extremities 10/21/2023   Myalgia 10/21/2023   Chronic nonintractable headache 10/21/2023   Chronic neck pain 10/21/2023   Other chronic pain 10/21/2023   Varicocele 10/21/2023   Malingering 08/11/2022  Generalized anxiety disorder 01/27/2022   Fibromyalgia 01/27/2022   GERD (gastroesophageal reflux disease) 01/27/2022   Pupil irregular of left eye 11/17/2020   Acute nonintractable headache 11/17/2020   Concussion with no loss of consciousness 11/17/2020   Livedo reticularis 09/08/2020   Family history of autoimmune disorder 09/08/2020   Meralgia paresthetica 09/08/2020   Paresthesia 09/08/2020   High  risk medication use 09/08/2020   Syncope 01/07/2020   Renal colic on left side 08/01/2019   Renal stone 08/01/2019   Hyperhidrosis of feet 07/19/2018   Metatarsalgia of left foot 07/19/2018   Major depressive disorder 04/04/2014   Insomnia 01/30/2014   Bipolar disorder, unspecified 01/30/2014   Marijuana abuse, continuous 09/02/2013   Benzodiazepine abuse/dependence 09/02/2013   Alcohol  abuse 09/02/2013   Opiate abuse, episodic 09/02/2013   Substance induced mood disorder 09/02/2013   Tobacco use disorder 08/18/2011   IBS (irritable bowel syndrome) 08/18/2011    Allergies:  Allergies  Allergen Reactions   Zofran  [Ondansetron  Hcl] Rash   Buspar [Buspirone Hcl] Hives   Ibuprofen [Ibuprofen] Hives   Lamictal [Lamotrigine]     Unknown reaction   Minipress [Prazosin]     Unknown reaction   Toradol  [Ketorolac  Tromethamine ]     Dry mouth and headache   Medications:  Current Outpatient Medications:    predniSONE  (STERAPRED UNI-PAK 21 TAB) 10 MG (21) TBPK tablet, Take following package directions, Disp: 21 tablet, Rfl: 0   triamcinolone  cream (KENALOG ) 0.1 %, Apply 1 Application topically 2 (two) times daily., Disp: 30 g, Rfl: 0   ALPRAZolam  (XANAX  XR) 2 MG 24 hr tablet, Take 1 tablet (2 mg total) by mouth at bedtime., Disp: 90 tablet, Rfl: 1   ALPRAZolam  (XANAX  XR) 2 MG 24 hr tablet, Take 1 tablet (2 mg total) by mouth at bedtime., Disp: 90 tablet, Rfl: 0   ALPRAZolam  (XANAX  XR) 2 MG 24 hr tablet, Take 1 tablet (2 mg total) by mouth at bedtime., Disp: 90 tablet, Rfl: 0   ALPRAZolam  (XANAX  XR) 2 MG 24 hr tablet, Take 1 tablet (2 mg total) by mouth at bedtime., Disp: 90 tablet, Rfl: 0   ALPRAZolam  (XANAX  XR) 2 MG 24 hr tablet, Take 1 tablet (2 mg total) by mouth at bedtime., Disp: 90 tablet, Rfl: 0   ALPRAZolam  (XANAX ) 0.5 MG tablet, Take 1 tablet (0.5 mg total) by mouth 2 (two) times daily as needed. (Patient taking differently: Take 0.5 mg by mouth 2 (two) times daily as needed for  anxiety.), Disp: 180 tablet, Rfl: 1   ALPRAZolam  (XANAX ) 0.5 MG tablet, Take 1 tablet (0.5 mg total) by mouth 2 (two) times daily as needed., Disp: 180 tablet, Rfl: 0   ALPRAZolam  (XANAX ) 0.5 MG tablet, Take 1 tablet (0.5 mg total) by mouth 2 (two) times daily as needed., Disp: 180 tablet, Rfl: 0   ALPRAZolam  (XANAX ) 0.5 MG tablet, Take 1 tablet (0.5 mg total) by mouth 2 (two) times daily as needed., Disp: 180 tablet, Rfl: 0   carisoprodol  (SOMA ) 350 MG tablet, Take 1 tablet (350 mg total) by mouth daily if needed, Disp: 90 tablet, Rfl: 0   carisoprodol  (SOMA ) 350 MG tablet, Take 1 tablet (350 mg total) by mouth daily as needed., Disp: 90 tablet, Rfl: 0   carisoprodol  (SOMA ) 350 MG tablet, Take 1/2 tablet (175 mg total) by mouth 2 (two) times daily., Disp: 30 tablet, Rfl: 0   carisoprodol  (SOMA ) 350 MG tablet, Take 1/2 tablets (175 mg total) by mouth 2 (two) times daily., Disp:  30 tablet, Rfl: 0   cloNIDine  (CATAPRES ) 0.2 MG tablet, Take 1 tablet (0.2 mg total) by mouth at bedtime., Disp: 90 tablet, Rfl: 0   cyclobenzaprine  (FLEXERIL ) 10 MG tablet, Take 1 tablet (10 mg total) by mouth at bedtime., Disp: 90 tablet, Rfl: 1   diclofenac  (VOLTAREN ) 75 MG EC tablet, Take 1 tablet (75 mg total) by mouth every 12 (twelve) hours., Disp: 60 tablet, Rfl: 0   diclofenac  (VOLTAREN ) 75 MG EC tablet, Take 1 tablet (75 mg total) by mouth 2 (two) times daily., Disp: 60 tablet, Rfl: 0   dronabinol  (MARINOL ) 10 MG capsule, Take 1 capsule (10 mg total) by mouth 2 (two) times daily., Disp: 60 capsule, Rfl: 3   dronabinol  (MARINOL ) 5 MG capsule, Take 1 capsule (5 mg total) by mouth 2 (two) times daily., Disp: 60 capsule, Rfl: 2   DULoxetine  (CYMBALTA ) 60 MG capsule, Take 2 capsules (120 mg total) by mouth every morning., Disp: 180 capsule, Rfl: 0   DULoxetine  (CYMBALTA ) 60 MG capsule, Take 2 capsules (120 mg total) by mouth in the morning., Disp: 180 capsule, Rfl: 0   Erenumab -aooe (AIMOVIG ) 140 MG/ML SOAJ, Inject 140 mg  into the skin every 28 (twenty-eight) days., Disp: 1 mL, Rfl: 5   lidocaine  (LIDODERM ) 5 %, Place 1 patch onto the skin daily. Remove & Discard patch within 12 hours or as directed by MD, Disp: 90 patch, Rfl: 0   naloxone  (NARCAN ) nasal spray 4 mg/0.1 mL, Place 1 spray into the nose as directed (Patient not taking: Reported on 01/17/2024), Disp: 2 each, Rfl: 0   naloxone  (NARCAN ) nasal spray 4 mg/0.1 mL, Place 1 spray into one nostril once. Call 911 and repeat second dose in alternate nostril if no response., Disp: 2 each, Rfl: 0   naloxone  (NARCAN ) nasal spray 4 mg/0.1 mL, Place 1 spray into the nose as needed as directed., Disp: 2 each, Rfl: 0   oxyCODONE -acetaminophen  (PERCOCET) 10-325 MG tablet, Take 1 tablet by mouth 5 (five) times daily., Disp: 150 tablet, Rfl: 0   oxyCODONE -acetaminophen  (PERCOCET) 10-325 MG tablet, Take 1 tablet by mouth every 5 (five) hours as needed., Disp: 150 tablet, Rfl: 0   oxyCODONE -acetaminophen  (PERCOCET) 5-325 MG tablet, Take 1 tablet by mouth every 6 (six) hours., Disp: 120 tablet, Rfl: 0   QUEtiapine  (SEROQUEL ) 300 MG tablet, Take 1 tablet (300 mg total) by mouth at bedtime., Disp: 90 tablet, Rfl: 0   QUEtiapine  (SEROQUEL ) 300 MG tablet, Take 1 tablet (300 mg total) by mouth at bedtime., Disp: 90 tablet, Rfl: 0   SUMAtriptan  (IMITREX ) 100 MG tablet, Take 1 tablet at earliest onset of headache.  May repeat in 2 hours if headache persists or recurs.  Maximum 2 tablets in 24 hours., Disp: 10 tablet, Rfl: 5   tiZANidine  (ZANAFLEX ) 4 MG tablet, Take 1 tablet (4 mg total) by mouth at bedtime as needed for muscle spasms. (Patient not taking: Reported on 10/21/2023), Disp: 30 tablet, Rfl: 0  Observations/Objective: Patient is well-developed, well-nourished in no acute distress.  Resting comfortably  at home.  Head is normocephalic, atraumatic.  No labored breathing.  Speech is clear and coherent with logical content.  Patient is alert and oriented at baseline.   Erythematous, papulovesicular rash noted over bilateral lower extremities, worse anteriorly.  Some of these areas are linear, whereas others are scattered.  Assessment and Plan: 1. Poison ivy dermatitis (Primary) - triamcinolone  cream (KENALOG ) 0.1 %; Apply 1 Application topically 2 (two) times daily.  Dispense: 30  g; Refill: 0 - predniSONE  (STERAPRED UNI-PAK 21 TAB) 10 MG (21) TBPK tablet; Take following package directions  Dispense: 21 tablet; Refill: 0  Supportive measures and OTC medications reviewed.  Okay to continue Benadryl  OTC.  Will start triamcinolone  topically to bilateral lower extremities.  Want to try to avoid oral steroids if possible given his history of bipolar disorder.  He does note that he has been able to tolerate them previously, but can tolerate prednisone  only, not methylprednisolone .  Sterapred pack placed on file to use as directed if needed.  Follow Up Instructions: I discussed the assessment and treatment plan with the patient. The patient was provided an opportunity to ask questions and all were answered. The patient agreed with the plan and demonstrated an understanding of the instructions.  A copy of instructions were sent to the patient via MyChart unless otherwise noted below.    The patient was advised to call back or seek an in-person evaluation if the symptoms worsen or if the condition fails to improve as anticipated.    Carl Velma Lunger, PA-C

## 2024-06-06 ENCOUNTER — Other Ambulatory Visit: Payer: Self-pay | Admitting: Medical

## 2024-06-06 DIAGNOSIS — L237 Allergic contact dermatitis due to plants, except food: Secondary | ICD-10-CM

## 2024-06-06 MED ORDER — TRIAMCINOLONE ACETONIDE 0.1 % EX CREA: 1.0000 | TOPICAL_CREAM | Freq: Two times a day (BID) | CUTANEOUS | 0 refills | Status: DC

## 2024-06-07 ENCOUNTER — Other Ambulatory Visit (HOSPITAL_COMMUNITY): Payer: Self-pay

## 2024-06-11 ENCOUNTER — Other Ambulatory Visit (HOSPITAL_COMMUNITY): Payer: Self-pay

## 2024-06-14 ENCOUNTER — Other Ambulatory Visit (HOSPITAL_COMMUNITY): Payer: Self-pay

## 2024-06-18 ENCOUNTER — Other Ambulatory Visit (HOSPITAL_COMMUNITY): Payer: Self-pay

## 2024-06-19 ENCOUNTER — Other Ambulatory Visit (HOSPITAL_COMMUNITY): Payer: Self-pay

## 2024-06-19 MED ORDER — QUETIAPINE FUMARATE 300 MG PO TABS
300.0000 mg | ORAL_TABLET | Freq: Every day | ORAL | 0 refills | Status: DC
  Filled 2024-06-19: qty 90, 90d supply, fill #0

## 2024-06-25 ENCOUNTER — Other Ambulatory Visit (HOSPITAL_COMMUNITY): Payer: Self-pay

## 2024-06-27 ENCOUNTER — Other Ambulatory Visit (HOSPITAL_COMMUNITY): Payer: Self-pay

## 2024-06-28 ENCOUNTER — Other Ambulatory Visit (HOSPITAL_COMMUNITY): Payer: Self-pay

## 2024-06-28 DIAGNOSIS — Z79899 Other long term (current) drug therapy: Secondary | ICD-10-CM | POA: Diagnosis not present

## 2024-06-28 MED ORDER — CARISOPRODOL 350 MG PO TABS
175.0000 mg | ORAL_TABLET | Freq: Two times a day (BID) | ORAL | 0 refills | Status: AC
  Filled 2024-06-28: qty 30, 30d supply, fill #0

## 2024-06-28 MED ORDER — OXYCODONE-ACETAMINOPHEN 10-325 MG PO TABS
1.0000 | ORAL_TABLET | ORAL | 0 refills | Status: DC | PRN
  Filled 2024-06-28: qty 135, 27d supply, fill #0
  Filled 2024-06-28 (×2): qty 15, 3d supply, fill #0

## 2024-06-28 MED ORDER — NALOXONE HCL 4 MG/0.1ML NA LIQD
1.0000 | NASAL | 0 refills | Status: DC
  Filled 2024-06-28: qty 2, 15d supply, fill #0

## 2024-06-28 MED ORDER — DICLOFENAC SODIUM 75 MG PO TBEC
75.0000 mg | DELAYED_RELEASE_TABLET | Freq: Two times a day (BID) | ORAL | 0 refills | Status: DC
  Filled 2024-06-28: qty 60, 30d supply, fill #0

## 2024-06-29 ENCOUNTER — Other Ambulatory Visit (HOSPITAL_COMMUNITY): Payer: Self-pay

## 2024-06-29 ENCOUNTER — Other Ambulatory Visit: Payer: Self-pay

## 2024-07-03 DIAGNOSIS — Z79899 Other long term (current) drug therapy: Secondary | ICD-10-CM | POA: Diagnosis not present

## 2024-07-10 ENCOUNTER — Other Ambulatory Visit (HOSPITAL_COMMUNITY): Payer: Self-pay

## 2024-07-10 ENCOUNTER — Other Ambulatory Visit: Payer: Self-pay

## 2024-07-16 ENCOUNTER — Other Ambulatory Visit: Payer: Self-pay

## 2024-07-17 ENCOUNTER — Other Ambulatory Visit (HOSPITAL_COMMUNITY): Payer: Self-pay

## 2024-07-26 DIAGNOSIS — Z79899 Other long term (current) drug therapy: Secondary | ICD-10-CM | POA: Diagnosis not present

## 2024-07-27 ENCOUNTER — Other Ambulatory Visit (HOSPITAL_COMMUNITY): Payer: Self-pay

## 2024-07-27 MED ORDER — DICLOFENAC SODIUM 75 MG PO TBEC
75.0000 mg | DELAYED_RELEASE_TABLET | Freq: Two times a day (BID) | ORAL | 0 refills | Status: DC
  Filled 2024-07-27: qty 60, 30d supply, fill #0

## 2024-07-27 MED ORDER — CARISOPRODOL 350 MG PO TABS
175.0000 mg | ORAL_TABLET | Freq: Two times a day (BID) | ORAL | 0 refills | Status: AC
  Filled 2024-07-27: qty 30, 30d supply, fill #0

## 2024-07-27 MED ORDER — OXYCODONE-ACETAMINOPHEN 10-325 MG PO TABS
1.0000 | ORAL_TABLET | ORAL | 0 refills | Status: DC | PRN
  Filled 2024-07-27: qty 15, 3d supply, fill #0
  Filled 2024-07-27: qty 135, 29d supply, fill #0

## 2024-07-30 ENCOUNTER — Other Ambulatory Visit (HOSPITAL_COMMUNITY): Payer: Self-pay

## 2024-07-30 ENCOUNTER — Other Ambulatory Visit: Payer: Self-pay

## 2024-07-30 DIAGNOSIS — Z79899 Other long term (current) drug therapy: Secondary | ICD-10-CM | POA: Diagnosis not present
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
## 2024-07-31 ENCOUNTER — Ambulatory Visit (INDEPENDENT_AMBULATORY_CARE_PROVIDER_SITE_OTHER): Admitting: Neurology

## 2024-07-31 ENCOUNTER — Encounter: Payer: Self-pay | Admitting: Neurology

## 2024-07-31 ENCOUNTER — Telehealth: Payer: Self-pay | Admitting: Pharmacy Technician

## 2024-07-31 ENCOUNTER — Other Ambulatory Visit (HOSPITAL_COMMUNITY): Payer: Self-pay

## 2024-07-31 ENCOUNTER — Telehealth: Payer: Self-pay

## 2024-07-31 VITALS — BP 114/75 | HR 87 | Ht 75.0 in | Wt 192.0 lb

## 2024-07-31 DIAGNOSIS — G43019 Migraine without aura, intractable, without status migrainosus: Secondary | ICD-10-CM

## 2024-07-31 MED ORDER — AJOVY 225 MG/1.5ML ~~LOC~~ SOAJ: 225.0000 mg | SUBCUTANEOUS | 5 refills | Status: DC

## 2024-07-31 MED ORDER — UBRELVY 100 MG PO TABS: 1.0000 | ORAL_TABLET | ORAL | 5 refills | Status: DC | PRN

## 2024-07-31 NOTE — Telephone Encounter (Signed)
 Pharmacy Patient Advocate Encounter   Received notification from Pt Calls Messages that prior authorization for AJOVY  225MG  is required/requested.   Insurance verification completed.   The patient is insured through [facility] .   Per test claim: PA required; PA submitted to above mentioned insurance via Latent Key/confirmation #/EOC [id number] Status is pending

## 2024-07-31 NOTE — Telephone Encounter (Signed)
 Pharmacy Patient Advocate Encounter   Received notification from Pt Calls Messages that prior authorization for UBRELVY  100MG  is required/requested.   Insurance verification completed.   The patient is insured through [facility] .   Per test claim: PA required; PA submitted to above mentioned insurance via Latent Key/confirmation #/EOC [id number] Status is pending

## 2024-07-31 NOTE — Telephone Encounter (Signed)
 PA has been submitted, and telephone encounter has been created. Please see telephone encounter dated 9.23.25.

## 2024-07-31 NOTE — Telephone Encounter (Signed)
 PA needed for Ajovy /Ubrelvy 

## 2024-07-31 NOTE — Patient Instructions (Signed)
  Plan to change from Aimovig  to Ajovy  every 28 days.  Contact us  in 3 months with update and we can increase dose if needed. Take Ubrelvy  at earliest onset of headache.  May repeat dose once in 2 hours if needed.  Maximum 2 tablets in 24 hours. Limit use of pain relievers to no more than 9 days out of the month.  These medications include acetaminophen , NSAIDs (ibuprofen/Advil/Motrin, naproxen /Aleve , triptans (Imitrex /sumatriptan ), Excedrin, and narcotics.  This will help reduce risk of rebound headaches. Be aware of common food triggers Routine exercise Stay adequately hydrated (aim for 64 oz water daily) Keep headache diary Maintain proper stress management Maintain proper sleep hygiene Do not skip meals Consider supplements:  magnesium  citrate 400mg  daily, riboflavin 400mg  daily, coenzyme Q10 100mg  three times daily.

## 2024-08-01 ENCOUNTER — Other Ambulatory Visit (HOSPITAL_COMMUNITY): Payer: Self-pay

## 2024-08-01 ENCOUNTER — Other Ambulatory Visit: Payer: Self-pay

## 2024-08-01 ENCOUNTER — Encounter: Payer: Self-pay | Admitting: Neurology

## 2024-08-01 MED ORDER — UBRELVY 100 MG PO TABS
1.0000 | ORAL_TABLET | ORAL | 5 refills | Status: AC | PRN
  Filled 2024-08-01 (×2): qty 16, 30d supply, fill #0
  Filled 2024-08-27: qty 16, 30d supply, fill #1
  Filled 2024-09-26: qty 16, 30d supply, fill #2
  Filled 2024-11-13: qty 16, 30d supply, fill #3

## 2024-08-01 MED ORDER — AJOVY 225 MG/1.5ML ~~LOC~~ SOAJ
225.0000 mg | SUBCUTANEOUS | 5 refills | Status: DC
  Filled 2024-08-01: qty 1.5, 28d supply, fill #0

## 2024-08-01 NOTE — Telephone Encounter (Signed)
 Pharmacy Patient Advocate Encounter  Received notification from [employer] that Prior Authorization for UBRELVY  100MG  has been APPROVED from 9.23.25 to 3.22.26. Ran test claim, Copay is $0. This test claim was processed through [facility]- copay amounts may vary at other pharmacies due to pharmacy/plan contracts, or as the patient moves through the different stages of their insurance plan.   PA #/Case ID/Reference #: [id number]

## 2024-08-01 NOTE — Telephone Encounter (Signed)
 Both PA are's approved see 9.23.25 encounters

## 2024-08-01 NOTE — Telephone Encounter (Signed)
 Pharmacy Patient Advocate Encounter  Received notification from [employer] that Prior Authorization for AJOVY  225MG  has been APPROVED from 9.23.25 to 3.22.26. Ran test claim, Copay is $0. This test claim was processed through [facility]- copay amounts may vary at other pharmacies due to pharmacy/plan contracts, or as the patient moves through the different stages of their insurance plan.   PA #/Case ID/Reference #: [id number]

## 2024-08-08 ENCOUNTER — Other Ambulatory Visit (HOSPITAL_COMMUNITY): Payer: Self-pay

## 2024-08-13 ENCOUNTER — Other Ambulatory Visit: Payer: Self-pay

## 2024-08-16 ENCOUNTER — Other Ambulatory Visit (HOSPITAL_COMMUNITY): Payer: Self-pay

## 2024-08-17 ENCOUNTER — Other Ambulatory Visit (HOSPITAL_COMMUNITY): Payer: Self-pay

## 2024-08-17 MED ORDER — QUETIAPINE FUMARATE 300 MG PO TABS
300.0000 mg | ORAL_TABLET | Freq: Every day | ORAL | 0 refills | Status: AC
  Filled 2024-08-17: qty 90, 90d supply, fill #0
  Filled 2024-08-17: qty 14, 14d supply, fill #0
  Filled 2024-09-26: qty 14, 14d supply, fill #1

## 2024-08-21 ENCOUNTER — Other Ambulatory Visit (HOSPITAL_COMMUNITY): Payer: Self-pay

## 2024-08-21 DIAGNOSIS — F3181 Bipolar II disorder: Secondary | ICD-10-CM | POA: Diagnosis not present

## 2024-08-21 DIAGNOSIS — F4312 Post-traumatic stress disorder, chronic: Secondary | ICD-10-CM | POA: Diagnosis not present

## 2024-08-21 MED ORDER — CLONIDINE HCL 0.2 MG PO TABS
0.2000 mg | ORAL_TABLET | Freq: Every day | ORAL | 0 refills | Status: DC
  Filled 2024-08-21: qty 90, 90d supply, fill #0

## 2024-08-21 MED ORDER — ALPRAZOLAM ER 2 MG PO TB24
2.0000 mg | ORAL_TABLET | Freq: Every evening | ORAL | 0 refills | Status: DC
  Filled 2024-08-21: qty 90, 90d supply, fill #0
  Filled 2024-09-14: qty 30, 30d supply, fill #0
  Filled 2024-10-09 – 2024-10-12 (×2): qty 30, 30d supply, fill #1
  Filled 2024-11-12: qty 30, 30d supply, fill #2

## 2024-08-21 MED ORDER — CARISOPRODOL 350 MG PO TABS
350.0000 mg | ORAL_TABLET | Freq: Every day | ORAL | 0 refills | Status: AC | PRN
  Filled 2024-08-21: qty 90, 90d supply, fill #0

## 2024-08-21 MED ORDER — CYCLOBENZAPRINE HCL 10 MG PO TABS
10.0000 mg | ORAL_TABLET | Freq: Every day | ORAL | 0 refills | Status: DC
  Filled 2024-08-21: qty 90, 90d supply, fill #0

## 2024-08-21 MED ORDER — ALPRAZOLAM 0.5 MG PO TABS
0.5000 mg | ORAL_TABLET | Freq: Two times a day (BID) | ORAL | 0 refills | Status: DC | PRN
  Filled 2024-08-21: qty 180, 90d supply, fill #0
  Filled 2024-09-14: qty 60, 30d supply, fill #0
  Filled 2024-10-09 – 2024-10-12 (×2): qty 60, 30d supply, fill #1
  Filled 2024-11-12 (×2): qty 60, 30d supply, fill #2

## 2024-08-21 MED ORDER — DULOXETINE HCL 60 MG PO CPEP
120.0000 mg | ORAL_CAPSULE | Freq: Every morning | ORAL | 0 refills | Status: DC
  Filled 2024-08-21: qty 180, 90d supply, fill #0

## 2024-08-21 MED ORDER — QUETIAPINE FUMARATE 300 MG PO TABS
300.0000 mg | ORAL_TABLET | Freq: Every evening | ORAL | 0 refills | Status: AC
  Filled 2024-08-21 – 2024-08-31 (×3): qty 90, 90d supply, fill #0

## 2024-08-22 ENCOUNTER — Other Ambulatory Visit: Payer: Self-pay

## 2024-08-23 ENCOUNTER — Other Ambulatory Visit (HOSPITAL_COMMUNITY): Payer: Self-pay

## 2024-08-23 DIAGNOSIS — Z79899 Other long term (current) drug therapy: Secondary | ICD-10-CM | POA: Diagnosis not present

## 2024-08-23 MED ORDER — OXYCODONE-ACETAMINOPHEN 10-325 MG PO TABS
1.0000 | ORAL_TABLET | ORAL | 0 refills | Status: DC | PRN
  Filled 2024-08-23: qty 150, 32d supply, fill #0
  Filled 2024-08-27: qty 150, 30d supply, fill #0

## 2024-08-23 MED ORDER — NALOXONE HCL 4 MG/0.1ML NA LIQD
4.0000 mg | NASAL | 0 refills | Status: AC
  Filled 2024-08-23: qty 2, 1d supply, fill #0
  Filled 2024-08-27: qty 2, 30d supply, fill #0

## 2024-08-23 MED ORDER — DICLOFENAC SODIUM 75 MG PO TBEC
75.0000 mg | DELAYED_RELEASE_TABLET | Freq: Two times a day (BID) | ORAL | 0 refills | Status: DC
  Filled 2024-08-23 – 2024-08-27 (×2): qty 60, 30d supply, fill #0

## 2024-08-23 MED ORDER — CARISOPRODOL 350 MG PO TABS
175.0000 mg | ORAL_TABLET | Freq: Two times a day (BID) | ORAL | 0 refills | Status: AC
  Filled 2024-08-23 – 2024-08-27 (×2): qty 30, 30d supply, fill #0

## 2024-08-24 ENCOUNTER — Other Ambulatory Visit (HOSPITAL_COMMUNITY): Payer: Self-pay

## 2024-08-27 ENCOUNTER — Telehealth (HOSPITAL_COMMUNITY): Payer: Self-pay

## 2024-08-27 ENCOUNTER — Other Ambulatory Visit: Payer: Self-pay

## 2024-08-27 ENCOUNTER — Other Ambulatory Visit (HOSPITAL_COMMUNITY): Payer: Self-pay

## 2024-08-27 ENCOUNTER — Other Ambulatory Visit: Payer: Self-pay | Admitting: Neurology

## 2024-08-27 ENCOUNTER — Encounter: Payer: Self-pay | Admitting: Neurology

## 2024-08-27 MED ORDER — AIMOVIG 140 MG/ML ~~LOC~~ SOAJ
140.0000 mg | SUBCUTANEOUS | 5 refills | Status: AC
  Filled 2024-08-27: qty 1, 28d supply, fill #0
  Filled 2024-08-29: qty 1, 30d supply, fill #0
  Filled 2024-09-26: qty 1, 30d supply, fill #1
  Filled 2024-11-06: qty 1, 30d supply, fill #2
  Filled 2024-12-04: qty 1, 30d supply, fill #3

## 2024-08-28 ENCOUNTER — Other Ambulatory Visit (HOSPITAL_COMMUNITY): Payer: Self-pay

## 2024-08-28 MED ORDER — DRONABINOL 10 MG PO CAPS
10.0000 mg | ORAL_CAPSULE | Freq: Two times a day (BID) | ORAL | 3 refills | Status: AC
  Filled 2024-08-28: qty 60, 30d supply, fill #0
  Filled 2024-09-26: qty 60, 30d supply, fill #1
  Filled 2024-11-06: qty 60, 30d supply, fill #2

## 2024-08-29 ENCOUNTER — Other Ambulatory Visit (HOSPITAL_COMMUNITY): Payer: Self-pay

## 2024-08-29 ENCOUNTER — Telehealth: Payer: Self-pay | Admitting: Pharmacy Technician

## 2024-08-29 NOTE — Telephone Encounter (Signed)
 PA has been submitted, and telephone encounter has been created. Please see telephone encounter dated 10.22.25.  This has been approved and has been processed at the pharmacy as of today's date. Waiting for final approval letter to update chart notes.

## 2024-08-29 NOTE — Telephone Encounter (Signed)
 PA has been submitted, and telephone encounter has been created. Please see telephone encounter dated 10.22.25.

## 2024-08-29 NOTE — Telephone Encounter (Signed)
 Pharmacy Patient Advocate Encounter   Received notification from Pt Calls Messages that prior authorization for AIMOVIG  140MG  is required/requested.   Insurance verification completed.   The patient is insured through [facility].   Per test claim: PA required; PA submitted to above mentioned insurance via Latent Key/confirmation #/EOC [id number] Status is pending

## 2024-08-30 ENCOUNTER — Other Ambulatory Visit (HOSPITAL_COMMUNITY): Payer: Self-pay

## 2024-08-30 DIAGNOSIS — Z79899 Other long term (current) drug therapy: Secondary | ICD-10-CM | POA: Diagnosis not present

## 2024-08-30 NOTE — Telephone Encounter (Signed)
 Pharmacy Patient Advocate Encounter  Received notification from [employer] that Prior Authorization for AIMOVIG  140MG  has been APPROVED from 10.22.25 to 10.21.26. Ran test claim, Copay is $0. This test claim was processed through [facility]- copay amounts may vary at other pharmacies due to pharmacy/plan contracts, or as the patient moves through the different stages of their insurance plan.   PA #/Case ID/Reference #: [id number]

## 2024-08-31 ENCOUNTER — Other Ambulatory Visit (HOSPITAL_COMMUNITY): Payer: Self-pay

## 2024-09-03 ENCOUNTER — Other Ambulatory Visit (HOSPITAL_COMMUNITY): Payer: Self-pay

## 2024-09-06 ENCOUNTER — Other Ambulatory Visit (HOSPITAL_COMMUNITY): Payer: Self-pay

## 2024-09-14 ENCOUNTER — Other Ambulatory Visit (HOSPITAL_COMMUNITY): Payer: Self-pay

## 2024-09-20 ENCOUNTER — Other Ambulatory Visit (HOSPITAL_COMMUNITY): Payer: Self-pay

## 2024-09-20 DIAGNOSIS — Z79899 Other long term (current) drug therapy: Secondary | ICD-10-CM | POA: Diagnosis not present

## 2024-09-20 MED ORDER — OXYCODONE-ACETAMINOPHEN 10-325 MG PO TABS
1.0000 | ORAL_TABLET | ORAL | 0 refills | Status: DC | PRN
  Filled 2024-09-25: qty 135, 27d supply, fill #0
  Filled 2024-09-25: qty 15, 3d supply, fill #0

## 2024-09-20 MED ORDER — DICLOFENAC SODIUM 75 MG PO TBEC
75.0000 mg | DELAYED_RELEASE_TABLET | Freq: Two times a day (BID) | ORAL | 0 refills | Status: DC
  Filled 2024-09-20: qty 60, 30d supply, fill #0

## 2024-09-20 MED ORDER — CARISOPRODOL 350 MG PO TABS
175.0000 mg | ORAL_TABLET | Freq: Two times a day (BID) | ORAL | 0 refills | Status: AC
  Filled 2024-09-26: qty 30, 30d supply, fill #0

## 2024-09-21 ENCOUNTER — Other Ambulatory Visit (HOSPITAL_COMMUNITY): Payer: Self-pay

## 2024-09-21 ENCOUNTER — Other Ambulatory Visit: Payer: Self-pay

## 2024-09-24 DIAGNOSIS — Z79899 Other long term (current) drug therapy: Secondary | ICD-10-CM | POA: Diagnosis not present

## 2024-09-25 ENCOUNTER — Other Ambulatory Visit (HOSPITAL_COMMUNITY): Payer: Self-pay

## 2024-09-25 ENCOUNTER — Other Ambulatory Visit: Payer: Self-pay

## 2024-09-26 ENCOUNTER — Other Ambulatory Visit (HOSPITAL_COMMUNITY): Payer: Self-pay

## 2024-09-26 ENCOUNTER — Other Ambulatory Visit: Payer: Self-pay

## 2024-09-27 ENCOUNTER — Encounter: Payer: Self-pay | Admitting: Neurology

## 2024-10-09 ENCOUNTER — Other Ambulatory Visit (HOSPITAL_COMMUNITY): Payer: Self-pay

## 2024-10-12 ENCOUNTER — Other Ambulatory Visit: Payer: Self-pay

## 2024-10-23 ENCOUNTER — Other Ambulatory Visit (HOSPITAL_COMMUNITY): Payer: Self-pay

## 2024-10-23 DIAGNOSIS — Z79899 Other long term (current) drug therapy: Secondary | ICD-10-CM | POA: Diagnosis not present

## 2024-10-23 MED ORDER — OXYCODONE-ACETAMINOPHEN 10-325 MG PO TABS
1.0000 | ORAL_TABLET | ORAL | 0 refills | Status: DC | PRN
  Filled 2024-10-23: qty 150, 30d supply, fill #0
  Filled 2024-10-25: qty 150, 32d supply, fill #0

## 2024-10-23 MED ORDER — CARISOPRODOL 350 MG PO TABS
175.0000 mg | ORAL_TABLET | Freq: Two times a day (BID) | ORAL | 0 refills | Status: AC
  Filled 2024-11-06: qty 30, 30d supply, fill #0

## 2024-10-23 MED ORDER — MOVANTIK 25 MG PO TABS
25.0000 mg | ORAL_TABLET | Freq: Every morning | ORAL | 12 refills | Status: AC
  Filled 2024-10-23: qty 30, 30d supply, fill #0

## 2024-10-23 MED ORDER — NALOXONE HCL 4 MG/0.1ML NA LIQD
1.0000 | NASAL | 0 refills | Status: AC | PRN
  Filled 2024-10-23: qty 2, 30d supply, fill #0

## 2024-10-23 MED ORDER — DICLOFENAC SODIUM 75 MG PO TBEC
75.0000 mg | DELAYED_RELEASE_TABLET | Freq: Two times a day (BID) | ORAL | 0 refills | Status: DC
  Filled 2024-10-23: qty 60, 30d supply, fill #0

## 2024-10-25 ENCOUNTER — Other Ambulatory Visit (HOSPITAL_COMMUNITY): Payer: Self-pay

## 2024-10-31 ENCOUNTER — Other Ambulatory Visit (HOSPITAL_COMMUNITY): Payer: Self-pay

## 2024-11-06 ENCOUNTER — Other Ambulatory Visit (HOSPITAL_COMMUNITY): Payer: Self-pay

## 2024-11-06 ENCOUNTER — Other Ambulatory Visit: Payer: Self-pay

## 2024-11-07 ENCOUNTER — Other Ambulatory Visit (HOSPITAL_COMMUNITY): Payer: Self-pay

## 2024-11-12 ENCOUNTER — Other Ambulatory Visit (HOSPITAL_COMMUNITY): Payer: Self-pay

## 2024-11-13 ENCOUNTER — Other Ambulatory Visit (HOSPITAL_COMMUNITY): Payer: Self-pay

## 2024-11-20 ENCOUNTER — Other Ambulatory Visit (HOSPITAL_COMMUNITY): Payer: Self-pay

## 2024-11-20 MED ORDER — CYCLOBENZAPRINE HCL 10 MG PO TABS
10.0000 mg | ORAL_TABLET | Freq: Every day | ORAL | 0 refills | Status: AC
  Filled 2024-11-20 (×2): qty 90, 90d supply, fill #0

## 2024-11-20 MED ORDER — ALPRAZOLAM 0.5 MG PO TABS
0.5000 mg | ORAL_TABLET | Freq: Two times a day (BID) | ORAL | 0 refills | Status: AC | PRN
  Filled 2024-11-20 – 2024-11-29 (×2): qty 180, 90d supply, fill #0
  Filled 2024-12-11: qty 60, 30d supply, fill #0

## 2024-11-20 MED ORDER — DULOXETINE HCL 60 MG PO CPEP
120.0000 mg | ORAL_CAPSULE | Freq: Every morning | ORAL | 0 refills | Status: AC
  Filled 2024-11-20 (×2): qty 180, 90d supply, fill #0

## 2024-11-20 MED ORDER — CARISOPRODOL 350 MG PO TABS: 350.0000 mg | ORAL_TABLET | Freq: Every day | ORAL | 0 refills | Status: AC | PRN

## 2024-11-20 MED ORDER — CLONIDINE HCL 0.2 MG PO TABS
0.2000 mg | ORAL_TABLET | Freq: Every day | ORAL | 0 refills | Status: AC
  Filled 2024-11-20 (×2): qty 90, 90d supply, fill #0

## 2024-11-20 MED ORDER — DRONABINOL 10 MG PO CAPS
10.0000 mg | ORAL_CAPSULE | Freq: Two times a day (BID) | ORAL | 3 refills | Status: AC
  Filled 2024-11-20: qty 60, 30d supply, fill #0

## 2024-11-20 MED ORDER — QUETIAPINE FUMARATE 300 MG PO TABS
300.0000 mg | ORAL_TABLET | Freq: Every day | ORAL | 0 refills | Status: AC
  Filled 2024-11-20 (×2): qty 90, 90d supply, fill #0

## 2024-11-20 MED ORDER — ALPRAZOLAM ER 2 MG PO TB24
2.0000 mg | ORAL_TABLET | Freq: Every evening | ORAL | 0 refills | Status: AC
  Filled 2024-12-11: qty 30, 30d supply, fill #0

## 2024-11-23 ENCOUNTER — Other Ambulatory Visit (HOSPITAL_COMMUNITY): Payer: Self-pay

## 2024-11-23 MED ORDER — NALOXONE HCL 4 MG/0.1ML NA LIQD: 1.0000 | NASAL | 0 refills | Status: AC

## 2024-11-23 MED ORDER — OXYCODONE-ACETAMINOPHEN 10-325 MG PO TABS
1.0000 | ORAL_TABLET | ORAL | 0 refills | Status: DC | PRN
  Filled 2024-11-23 (×2): qty 150, 30d supply, fill #0

## 2024-11-23 MED ORDER — CARISOPRODOL 350 MG PO TABS
175.0000 mg | ORAL_TABLET | Freq: Two times a day (BID) | ORAL | 0 refills | Status: AC
  Filled 2024-11-23: qty 30, 30d supply, fill #0

## 2024-11-23 MED ORDER — MOVANTIK 25 MG PO TABS
25.0000 mg | ORAL_TABLET | Freq: Every morning | ORAL | 12 refills | Status: AC
  Filled 2024-11-23: qty 30, 30d supply, fill #0

## 2024-11-23 MED ORDER — DICLOFENAC SODIUM 75 MG PO TBEC
75.0000 mg | DELAYED_RELEASE_TABLET | Freq: Two times a day (BID) | ORAL | 0 refills | Status: AC
  Filled 2024-11-23: qty 60, 30d supply, fill #0

## 2024-11-26 ENCOUNTER — Other Ambulatory Visit: Payer: Self-pay

## 2024-11-29 ENCOUNTER — Other Ambulatory Visit (HOSPITAL_COMMUNITY): Payer: Self-pay

## 2024-12-04 ENCOUNTER — Other Ambulatory Visit (HOSPITAL_COMMUNITY): Payer: Self-pay

## 2024-12-09 ENCOUNTER — Other Ambulatory Visit: Payer: Self-pay

## 2024-12-10 ENCOUNTER — Other Ambulatory Visit (HOSPITAL_COMMUNITY): Payer: Self-pay

## 2024-12-11 ENCOUNTER — Other Ambulatory Visit (HOSPITAL_COMMUNITY): Payer: Self-pay

## 2024-12-12 ENCOUNTER — Other Ambulatory Visit (HOSPITAL_COMMUNITY): Payer: Self-pay

## 2024-12-12 ENCOUNTER — Other Ambulatory Visit: Payer: Self-pay

## 2024-12-12 MED ORDER — DICLOFENAC SODIUM 75 MG PO TBEC
75.0000 mg | DELAYED_RELEASE_TABLET | Freq: Two times a day (BID) | ORAL | 0 refills | Status: AC
  Filled 2024-12-12: qty 60, 30d supply, fill #0

## 2024-12-12 MED ORDER — OXYCODONE-ACETAMINOPHEN 10-325 MG PO TABS
1.0000 | ORAL_TABLET | ORAL | 0 refills | Status: AC | PRN
  Filled ????-??-??: fill #0

## 2024-12-12 MED ORDER — MOVANTIK 25 MG PO TABS
25.0000 mg | ORAL_TABLET | Freq: Every morning | ORAL | 12 refills | Status: AC
  Filled 2024-12-12: qty 30, 30d supply, fill #0

## 2024-12-12 MED ORDER — CARISOPRODOL 350 MG PO TABS
175.0000 mg | ORAL_TABLET | Freq: Two times a day (BID) | ORAL | 0 refills | Status: AC
  Filled 2024-12-12: qty 30, 30d supply, fill #0

## 2025-02-25 ENCOUNTER — Ambulatory Visit: Admitting: Neurology

## 2025-02-27 ENCOUNTER — Ambulatory Visit: Admitting: Neurology
